# Patient Record
Sex: Male | Born: 1955 | Race: White | Hispanic: No | Marital: Married | Smoking: Never smoker
Health system: Southern US, Community
[De-identification: ages and names within clinical notes are randomized; demographics above are authoritative.]

## PROBLEM LIST (undated history)

## (undated) DIAGNOSIS — N400 Enlarged prostate without lower urinary tract symptoms: Secondary | ICD-10-CM

## (undated) DIAGNOSIS — M1990 Unspecified osteoarthritis, unspecified site: Secondary | ICD-10-CM

## (undated) DIAGNOSIS — Z9861 Coronary angioplasty status: Principal | ICD-10-CM

## (undated) DIAGNOSIS — K219 Gastro-esophageal reflux disease without esophagitis: Secondary | ICD-10-CM

## (undated) DIAGNOSIS — I10 Essential (primary) hypertension: Secondary | ICD-10-CM

## (undated) DIAGNOSIS — F32A Depression, unspecified: Secondary | ICD-10-CM

## (undated) DIAGNOSIS — I00 Rheumatic fever without heart involvement: Secondary | ICD-10-CM

## (undated) DIAGNOSIS — R42 Dizziness and giddiness: Secondary | ICD-10-CM

## (undated) DIAGNOSIS — I2111 ST elevation (STEMI) myocardial infarction involving right coronary artery: Secondary | ICD-10-CM

## (undated) DIAGNOSIS — E785 Hyperlipidemia, unspecified: Secondary | ICD-10-CM

## (undated) DIAGNOSIS — F319 Bipolar disorder, unspecified: Secondary | ICD-10-CM

## (undated) DIAGNOSIS — M069 Rheumatoid arthritis, unspecified: Secondary | ICD-10-CM

## (undated) DIAGNOSIS — F419 Anxiety disorder, unspecified: Secondary | ICD-10-CM

## (undated) DIAGNOSIS — I2510 Atherosclerotic heart disease of native coronary artery without angina pectoris: Secondary | ICD-10-CM

## (undated) DIAGNOSIS — F329 Major depressive disorder, single episode, unspecified: Secondary | ICD-10-CM

## (undated) HISTORY — PX: PERCUTANEOUS CORONARY STENT INTERVENTION (PCI-S): SHX6016

## (undated) HISTORY — DX: Coronary angioplasty status: Z98.61

## (undated) HISTORY — DX: ST elevation (STEMI) myocardial infarction involving right coronary artery: I21.11

## (undated) HISTORY — DX: Hyperlipidemia, unspecified: E78.5

## (undated) HISTORY — DX: Essential (primary) hypertension: I10

## (undated) HISTORY — DX: Atherosclerotic heart disease of native coronary artery without angina pectoris: I25.10

## (undated) HISTORY — DX: Dizziness and giddiness: R42

---
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
## 1998-09-05 ENCOUNTER — Ambulatory Visit (HOSPITAL_BASED_OUTPATIENT_CLINIC_OR_DEPARTMENT_OTHER): Admission: RE | Admit: 1998-09-05 | Discharge: 1998-09-05 | Payer: Self-pay | Admitting: Orthopedic Surgery

## 2002-04-02 HISTORY — PX: PLANTAR FASCIA SURGERY: SHX746

## 2008-07-09 ENCOUNTER — Encounter: Admission: RE | Admit: 2008-07-09 | Discharge: 2008-07-09 | Payer: Self-pay | Admitting: Sports Medicine

## 2009-01-27 ENCOUNTER — Encounter: Admission: RE | Admit: 2009-01-27 | Discharge: 2009-01-27 | Payer: Self-pay | Admitting: Internal Medicine

## 2009-03-31 ENCOUNTER — Encounter: Admission: RE | Admit: 2009-03-31 | Discharge: 2009-03-31 | Payer: Self-pay | Admitting: Neurology

## 2009-04-02 DIAGNOSIS — R42 Dizziness and giddiness: Secondary | ICD-10-CM

## 2009-04-02 HISTORY — DX: Dizziness and giddiness: R42

## 2009-04-07 ENCOUNTER — Ambulatory Visit: Payer: Self-pay

## 2009-04-07 ENCOUNTER — Encounter (INDEPENDENT_AMBULATORY_CARE_PROVIDER_SITE_OTHER): Payer: Self-pay | Admitting: Neurology

## 2009-04-07 ENCOUNTER — Ambulatory Visit (HOSPITAL_COMMUNITY): Admission: RE | Admit: 2009-04-07 | Discharge: 2009-04-07 | Payer: Self-pay | Admitting: Neurology

## 2009-04-07 ENCOUNTER — Ambulatory Visit: Payer: Self-pay | Admitting: Internal Medicine

## 2009-09-05 ENCOUNTER — Ambulatory Visit (HOSPITAL_BASED_OUTPATIENT_CLINIC_OR_DEPARTMENT_OTHER): Admission: RE | Admit: 2009-09-05 | Discharge: 2009-09-05 | Payer: Self-pay | Admitting: Orthopedic Surgery

## 2009-09-30 HISTORY — PX: SHOULDER ARTHROSCOPY W/ ROTATOR CUFF REPAIR: SHX2400

## 2010-06-19 LAB — POCT I-STAT, CHEM 8
BUN: 26 mg/dL — ABNORMAL HIGH (ref 6–23)
Glucose, Bld: 100 mg/dL — ABNORMAL HIGH (ref 70–99)
HCT: 45 % (ref 39.0–52.0)
Hemoglobin: 15.3 g/dL (ref 13.0–17.0)
Potassium: 3.7 mEq/L (ref 3.5–5.1)
Sodium: 141 mEq/L (ref 135–145)
TCO2: 28 mmol/L (ref 0–100)

## 2011-06-01 DIAGNOSIS — I2119 ST elevation (STEMI) myocardial infarction involving other coronary artery of inferior wall: Secondary | ICD-10-CM | POA: Insufficient documentation

## 2011-06-01 DIAGNOSIS — I2111 ST elevation (STEMI) myocardial infarction involving right coronary artery: Secondary | ICD-10-CM

## 2011-06-01 HISTORY — DX: ST elevation (STEMI) myocardial infarction involving right coronary artery: I21.11

## 2011-06-03 DIAGNOSIS — I25119 Atherosclerotic heart disease of native coronary artery with unspecified angina pectoris: Secondary | ICD-10-CM | POA: Insufficient documentation

## 2011-06-03 DIAGNOSIS — I25709 Atherosclerosis of coronary artery bypass graft(s), unspecified, with unspecified angina pectoris: Secondary | ICD-10-CM | POA: Insufficient documentation

## 2011-06-28 ENCOUNTER — Other Ambulatory Visit: Payer: Self-pay

## 2011-06-28 ENCOUNTER — Encounter (HOSPITAL_COMMUNITY)
Admission: EM | Disposition: A | Discharge status: Home / Self Care | Payer: Self-pay | Source: Ambulatory Visit | Attending: Cardiology

## 2011-06-28 ENCOUNTER — Encounter (HOSPITAL_COMMUNITY): Payer: Self-pay | Admitting: Cardiology

## 2011-06-28 ENCOUNTER — Inpatient Hospital Stay (HOSPITAL_COMMUNITY)
Admission: EM | Admit: 2011-06-28 | Discharge: 2011-06-30 | DRG: 853 | Disposition: A | Discharge status: Home / Self Care | Payer: Federal, State, Local not specified - PPO | Source: Ambulatory Visit | Attending: Cardiology | Admitting: Cardiology

## 2011-06-28 DIAGNOSIS — K219 Gastro-esophageal reflux disease without esophagitis: Secondary | ICD-10-CM | POA: Diagnosis present

## 2011-06-28 DIAGNOSIS — I213 ST elevation (STEMI) myocardial infarction of unspecified site: Secondary | ICD-10-CM

## 2011-06-28 DIAGNOSIS — F419 Anxiety disorder, unspecified: Secondary | ICD-10-CM | POA: Diagnosis present

## 2011-06-28 DIAGNOSIS — I2119 ST elevation (STEMI) myocardial infarction involving other coronary artery of inferior wall: Principal | ICD-10-CM | POA: Diagnosis present

## 2011-06-28 DIAGNOSIS — F411 Generalized anxiety disorder: Secondary | ICD-10-CM | POA: Diagnosis present

## 2011-06-28 DIAGNOSIS — I498 Other specified cardiac arrhythmias: Secondary | ICD-10-CM | POA: Diagnosis present

## 2011-06-28 DIAGNOSIS — I10 Essential (primary) hypertension: Secondary | ICD-10-CM | POA: Diagnosis present

## 2011-06-28 DIAGNOSIS — Z955 Presence of coronary angioplasty implant and graft: Secondary | ICD-10-CM

## 2011-06-28 DIAGNOSIS — N400 Enlarged prostate without lower urinary tract symptoms: Secondary | ICD-10-CM | POA: Diagnosis present

## 2011-06-28 HISTORY — PX: LEFT HEART CATHETERIZATION WITH CORONARY ANGIOGRAM: SHX5451

## 2011-06-28 HISTORY — DX: Benign prostatic hyperplasia without lower urinary tract symptoms: N40.0

## 2011-06-28 HISTORY — DX: Gastro-esophageal reflux disease without esophagitis: K21.9

## 2011-06-28 HISTORY — DX: Anxiety disorder, unspecified: F41.9

## 2011-06-28 LAB — COMPREHENSIVE METABOLIC PANEL
ALT: 37 U/L (ref 0–53)
Albumin: 3.9 g/dL (ref 3.5–5.2)
Alkaline Phosphatase: 49 U/L (ref 39–117)
Calcium: 9 mg/dL (ref 8.4–10.5)
Chloride: 99 mEq/L (ref 96–112)
GFR calc non Af Amer: >90 mL/min (ref >90)
Glucose, Bld: 131 mg/dL — ABNORMAL HIGH (ref 70–99)
Sodium: 137 mEq/L (ref 135–145)
Total Bilirubin: 0.5 mg/dL (ref 0.3–1.2)

## 2011-06-28 LAB — CARDIAC PANEL(CRET KIN+CKTOT+MB+TROPI)
CK, MB: 54.6 ng/mL (ref 0.3–4.0)
Relative Index: 9 — ABNORMAL HIGH (ref 0.0–2.5)
Total CK: 125 U/L (ref 7–232)
Total CK: 605 U/L — ABNORMAL HIGH (ref 7–232)
Troponin I: 21.21 ng/mL (ref <0.30)

## 2011-06-28 LAB — CBC
HCT: 37 % — ABNORMAL LOW (ref 39.0–52.0)
MCHC: 38 g/dL — ABNORMAL HIGH (ref 30.0–36.0)
RBC: 4.38 MIL/uL (ref 4.22–5.81)
WBC: 6.2 10*3/uL (ref 4.0–10.5)

## 2011-06-28 LAB — POCT I-STAT, CHEM 8
BUN: 20 mg/dL (ref 6–23)
Creatinine, Ser: 0.9 mg/dL (ref 0.50–1.35)
Glucose, Bld: 136 mg/dL — ABNORMAL HIGH (ref 70–99)
Hemoglobin: 13.6 g/dL (ref 13.0–17.0)
Potassium: 3.2 mEq/L — ABNORMAL LOW (ref 3.5–5.1)

## 2011-06-28 LAB — LIPID PANEL
HDL: 29 mg/dL — ABNORMAL LOW (ref >39)
Total CHOL/HDL Ratio: 6 RATIO
VLDL: 61 mg/dL — ABNORMAL HIGH (ref 0–40)

## 2011-06-28 LAB — PROTIME-INR: Prothrombin Time: >90 seconds — ABNORMAL HIGH (ref 11.6–15.2)

## 2011-06-28 SURGERY — LEFT HEART CATHETERIZATION WITH CORONARY ANGIOGRAM
Anesthesia: LOCAL

## 2011-06-28 MED ORDER — TRAVOPROST 0.004 % OP SOLN
1.0000 [drp] | Freq: Every day | OPHTHALMIC | Status: DC
  Administered 2011-06-28 – 2011-06-29 (×2): 1 [drp] via OPHTHALMIC
  Filled 2011-06-28 (×4): qty 0.1

## 2011-06-28 MED ORDER — ALPRAZOLAM 0.25 MG PO TABS: 0.2500 mg | ORAL_TABLET | Freq: Three times a day (TID) | ORAL | Status: DC | PRN

## 2011-06-28 MED ORDER — HEPARIN (PORCINE) IN NACL 2-0.9 UNIT/ML-% IJ SOLN
INTRAMUSCULAR | Status: AC
  Filled 2011-06-28: qty 2000

## 2011-06-28 MED ORDER — PRASUGREL HCL 10 MG PO TABS
10.0000 mg | ORAL_TABLET | Freq: Every day | ORAL | Status: DC
  Administered 2011-06-29 – 2011-06-30 (×2): 10 mg via ORAL
  Filled 2011-06-28 (×3): qty 1

## 2011-06-28 MED ORDER — THERA M PLUS PO TABS: 1.0000 | ORAL_TABLET | Freq: Every day | ORAL | Status: DC

## 2011-06-28 MED ORDER — RISEDRONATE SODIUM 5 MG PO TABS: 5.0000 mg | ORAL_TABLET | Freq: Every day | ORAL | Status: DC

## 2011-06-28 MED ORDER — SODIUM CHLORIDE 0.9 % IV SOLN: 1.0000 mL/kg/h | INTRAVENOUS | Status: AC

## 2011-06-28 MED ORDER — ASPIRIN 81 MG PO CHEW
81.0000 mg | CHEWABLE_TABLET | Freq: Every day | ORAL | Status: DC
  Administered 2011-06-29 – 2011-06-30 (×2): 81 mg via ORAL
  Filled 2011-06-28: qty 1

## 2011-06-28 MED ORDER — ATORVASTATIN CALCIUM 40 MG PO TABS
40.0000 mg | ORAL_TABLET | Freq: Every day | ORAL | Status: DC
  Administered 2011-06-28 – 2011-06-29 (×2): 40 mg via ORAL
  Filled 2011-06-28 (×3): qty 1

## 2011-06-28 MED ORDER — SODIUM CHLORIDE 0.9 % IV SOLN: 250.0000 mL | INTRAVENOUS | Status: DC

## 2011-06-28 MED ORDER — PANTOPRAZOLE SODIUM 40 MG PO TBEC
40.0000 mg | DELAYED_RELEASE_TABLET | Freq: Every day | ORAL | Status: DC
  Administered 2011-06-29 – 2011-06-30 (×2): 40 mg via ORAL
  Filled 2011-06-28 (×3): qty 1

## 2011-06-28 MED ORDER — FLUTICASONE PROPIONATE 50 MCG/ACT NA SUSP
1.0000 | Freq: Every day | NASAL | Status: DC | PRN
  Filled 2011-06-28: qty 16

## 2011-06-28 MED ORDER — LIDOCAINE HCL (PF) 1 % IJ SOLN
INTRAMUSCULAR | Status: AC
  Filled 2011-06-28: qty 30

## 2011-06-28 MED ORDER — BIVALIRUDIN 250 MG IV SOLR
INTRAVENOUS | Status: AC
  Filled 2011-06-28: qty 250

## 2011-06-28 MED ORDER — ONDANSETRON HCL 4 MG/2ML IJ SOLN: 4.0000 mg | Freq: Four times a day (QID) | INTRAMUSCULAR | Status: DC | PRN

## 2011-06-28 MED ORDER — ZOLPIDEM TARTRATE 5 MG PO TABS
10.0000 mg | ORAL_TABLET | Freq: Every evening | ORAL | Status: DC | PRN
  Administered 2011-06-28: 10 mg via ORAL
  Filled 2011-06-28: qty 2

## 2011-06-28 MED ORDER — METOPROLOL TARTRATE 12.5 MG HALF TABLET
12.5000 mg | ORAL_TABLET | Freq: Two times a day (BID) | ORAL | Status: DC
  Administered 2011-06-28 – 2011-06-30 (×4): 12.5 mg via ORAL
  Filled 2011-06-28 (×5): qty 1

## 2011-06-28 MED ORDER — ONDANSETRON HCL 4 MG/2ML IJ SOLN
INTRAMUSCULAR | Status: AC
  Filled 2011-06-28: qty 2

## 2011-06-28 MED ORDER — MIDAZOLAM HCL 2 MG/2ML IJ SOLN
INTRAMUSCULAR | Status: AC
  Filled 2011-06-28: qty 2

## 2011-06-28 MED ORDER — ADULT MULTIVITAMIN W/MINERALS CH
1.0000 | ORAL_TABLET | Freq: Every day | ORAL | Status: DC
  Administered 2011-06-29 – 2011-06-30 (×2): 1 via ORAL
  Filled 2011-06-28 (×2): qty 1

## 2011-06-28 MED ORDER — DUTASTERIDE 0.5 MG PO CAPS
0.5000 mg | ORAL_CAPSULE | Freq: Every day | ORAL | Status: DC
  Administered 2011-06-29 – 2011-06-30 (×2): 0.5 mg via ORAL
  Filled 2011-06-28 (×2): qty 1

## 2011-06-28 MED ORDER — IBUPROFEN 200 MG PO TABS
200.0000 mg | ORAL_TABLET | Freq: Four times a day (QID) | ORAL | Status: DC | PRN
  Filled 2011-06-28: qty 1

## 2011-06-28 MED ORDER — SODIUM CHLORIDE 0.9 % IJ SOLN: 3.0000 mL | INTRAMUSCULAR | Status: DC | PRN

## 2011-06-28 MED ORDER — POTASSIUM CHLORIDE CRYS ER 20 MEQ PO TBCR
40.0000 meq | EXTENDED_RELEASE_TABLET | Freq: Once | ORAL | Status: AC
  Administered 2011-06-28: 40 meq via ORAL
  Filled 2011-06-28: qty 2

## 2011-06-28 MED ORDER — ACETAMINOPHEN 325 MG PO TABS
650.0000 mg | ORAL_TABLET | ORAL | Status: DC | PRN
  Administered 2011-06-29 – 2011-06-30 (×2): 650 mg via ORAL
  Filled 2011-06-28 (×2): qty 2

## 2011-06-28 MED ORDER — SODIUM CHLORIDE 0.9 % IJ SOLN
3.0000 mL | Freq: Two times a day (BID) | INTRAMUSCULAR | Status: DC
  Administered 2011-06-29 (×2): 3 mL via INTRAVENOUS
  Administered 2011-06-29: 10:00:00 via INTRAVENOUS

## 2011-06-28 MED ORDER — PAROXETINE HCL 10 MG PO TABS
10.0000 mg | ORAL_TABLET | Freq: Every day | ORAL | Status: DC
  Administered 2011-06-29 – 2011-06-30 (×2): 10 mg via ORAL
  Filled 2011-06-28 (×3): qty 1

## 2011-06-28 MED ORDER — NITROGLYCERIN 0.2 MG/ML ON CALL CATH LAB
INTRAVENOUS | Status: AC
  Filled 2011-06-28: qty 1

## 2011-06-28 MED ORDER — PRASUGREL HCL 10 MG PO TABS
ORAL_TABLET | ORAL | Status: AC
  Filled 2011-06-28: qty 6

## 2011-06-28 MED ORDER — TRAVOPROST 0.004 % OP SOLN: 1.0000 [drp] | Freq: Every day | OPHTHALMIC | Status: DC

## 2011-06-28 NOTE — Progress Notes (Signed)
CRITICAL VALUE ALERT  Critical value received:  CKMB 54.6/ Troponin 21.21   Date of notification:  06-28-11  Time of notification:  2230  Critical value read back:yes  Nurse who received alert:  Hillery Aldo, RN  MD notified (1st page):  Hinda Glatter PA  Time of first page:  2236  MD notified (2nd page):  Time of second page:  Responding MD:  Hinda Glatter, PA  Time MD responded:  [phone]

## 2011-06-28 NOTE — Progress Notes (Signed)
PHARMACIST - PHYSICIAN COMMUNICATION  CONCERNING: P&T Medication Policy Regarding Oral Bisphosphonates  RECOMMENDATION: Your order for alendronate (Fosamax), ibandronate (Boniva), or risedronate (Actonel) has been discontinued at this time.  If the patient's post-hospital medical condition warrants safe use of this class of drugs, please resume the pre-hospital regimen upon discharge.  DESCRIPTION:  Alendronate (Fosamax), ibandronate (Boniva), and risedronate (Actonel) can cause severe esophageal erosions in patients who are unable to remain upright at least 30 minutes after taking this medication.   Since brief interruptions in therapy are thought to have minimal impact on bone mineral density, the Pharmacy & Therapeutics Committee has established that bisphosphonate orders should be routinely discontinued during hospitalization.   To override this safety policy and permit administration of Boniva, Fosamax, or Actonel in the hospital, prescribers must write "DO NOT HOLD" in the comments section when placing the order for this class of medications.  Thank you, Nadara Mustard, PharmD., MS Clinical Pharmacist Pager:  [phone]  06-28-2011 21:53PM

## 2011-06-28 NOTE — CV Procedure (Signed)
[facility]     CARDIAC CATHETERIZATION REPORT  NAME:  [name]     MRN: [id number] DATE OF BIRTH:  [birth year]   ADMIT DATE:  06/28/2011  Performing Cardiologist: Marykay Lex, M.D., M.S. Primary Physician: No primary provider on file. 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 The pt has no prior history of CAD or prior cardiac w/u. He did have an echo done Jan 2011 when he had a Neurologic w/u for dizziness. Echo showed inf HK otherwise normal LVF. This am he had some chest pain around 5am, not severe. He went on to work. Pain off and on all am then increasing symptoms with adiaphoresis. 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 EKG showed inf/septal ST elevation. He was taken urgently to cath lab.   Consent: The procedure with Risks/Benefits/Alternatives and Indications was reviewed with the patient.  All questions were answered.    Risks / Complications include, but not limited to: Death, MI, CVA/TIA, VF/VT (with defibrillation), Bradycardia (need for temporary pacer placement), contrast induced nephropathy, bleeding / bruising / hematoma / pseudoaneurysm, vascular or coronary injury (with possible emergent CT or Vascular Surgery), adverse medication reactions, infection.   Emergency consent obtained by verbal understanding.    Procedure: The patient was brought to the 2nd Floor [facility] in the fasting state and prepped and draped in the usual sterile  fashion for Right groin access. Sterile technique was used including antiseptics, cap, gloves, gown, hand hygiene, mask and sheet.  Skin prep: Chlorhexidine;  Time Out: Verified patient identification, verified procedure, site/side was marked, verified correct patient position, special equipment/implants available, medications/allergies/relevent history reviewed, required imaging and test results available.  Performed  The right femoral head was identified using tactile and fluoroscopic technique.  The right groin was anesthetized with 1% subcutaneous Lidocaine.  The right Common Femoral Artery was accessed using the Modified Seldinger Technique with placement of a antimicrobial bonded/coated single lumen (6 Fr) sheath.  The sheath was aspirated and flushed.    A 6 Fr JL4 followed by JR4 Catheter was advanced of over a Standard J wire into the ascending Aorta and used to engage the Left then Right Coronary Artery.  Multiple cineangiographic views of the Both Coronary Artery system(s) were performed.   After completion of the interventional procedure, the guide catheter was then exchanged over the standard J wire for an angled Pigtail catheter that was advanced across the Aortic Valve.  LV hemodynamics were measured and Left Ventriculography was performed.  LV hemodynamics were then re-sampled, and the catheter was pulled back across the Aortic Valve for measurement of "pull-back" gradient.  The catheter wire were removed completely out of the body.  Hemodynamics:  Central Aortic Pressure / Mean Aortic Pressure: 128/80 mmHg ; 101 mmHG  LV Pressure / LV End diastolic Pressure:  127/12  mmHg ; 17 mmHG  Left Ventriculography:  EF:  55-60%  Wall Motion: Mild mid inferior hypokinesis  Coronary Angiographic Data:  Left Main:  Large caliber vessel bifurcates into LAD and circumflex the is a short vessel that is angiographically normal.  Left Anterior Descending (LAD):  Large caliber vessel which done  around the apex. It gives rise to a major first diagonal branch that itself has several branches. Just after this diagonal branch there is a tubular 50% lesion in the LAD with a take off a large septal trunk. The vessel is then angiographically normal until just after giving rise to a second diagonal branch there is intramyocardial bridge is that short tubular 50% lesion. Otherwise the vessel is relatively normal.  1st diagonal (D1):  Moderate caliber vessel; ostial portion has a roughly 30% lesion. This gives rise to a proximal branch that bifurcates distally. Minimal luminal irregularities.  2nd diagonal (D2):  This is a smaller caliber vessel arising from the distal portion the mid LAD; angiographically normal  Circumflex (LCx):  Large caliber vessel gives rise to a very small first obtuse marginal and just before but V. the atrial ventricular groove vessel and a major OM there is a focal 20% lesion to dissected bifurcation in the atrial ventricular groove there is another napkin ring type 40% lesion right before it bifurcates in to get another OM branch and the posterior lateral branches.  1st obtuse marginal:  Small-caliber vessel that covers minimal area, but mild luminal irregularities  2nd obtuse marginal:  Major obtuse marginal branch somewhat tortuous with minimal luminal irregular reaches down toward the apex.  3rd obtuse marginal:  This is the second real obtuse marginal branch that has minimal luminal irregularities   posterior lateral branch:  The second branch of the AV groove circumflex that has minimal luminal irregularities  Right Coronary Artery: Large dominant vessel with a mid tubular 20% lesion followed by complete occlusion in the midportion is thrombotic appearance.  After reperfusion with the wire the vessel proved to be a large dominant vessel with extensive PL and PDA system. The following findings were post PCI:  posterior descending artery: This a large branching vessel  with minimal luminal irregularities.  posterior lateral branch:  This is a small posterior lateral system with several branches from the PDA covering the PL system.  There is also small marginal branch comes off just prior to the PDA in a sort of double barrel PDA system.  PERCUTANEOUS CORONARY INTERVENTION PROCEDURE  After reviewing the initial cineangiography images, the culprit lesion(s) was identified, and the decision was made to proceed with percutaneous coronary intervention.   A weight based bolus of IV Angiomax was administered and the drip was continued until completion of the procedure.   Oral Prasugrel 60 mg  was administered.  The Guide catheter(s) were advanced over a J-wire and used to engage the right Coronary Artery. An ACT of > 200 Sec was confirmed prior to advancing the Guidewire.  Lesion #1:  Mid RCA; thrombotic  Pre-PCI Stenosis: 100% % Post-PCI Stenosis: 0 %     TIMI 0 flow       TIMI 3 flow  Guide Catheter: JR 4  Guidewire: Prowater, exchanged for Cougar    Reperfusion was established with wire-passing  Expressway thrombectomy catheter: 1 pass; door to device time 22 minutes (1352 to 1414 hours)  Pre-Dilitation Balloon: Emerge Monorail 2.0 mm 12 mm   1st Inflation: 8 Atm for 31 Sec Scout angiography did not reveal evidence of dissection or perforation  Stent: Post Element DES 3.0 mm x 24 mm   Deployment: 12 Atm for 32 Sec  2nd Inflation: 17 Atm for 45 Sec; vessel diameter is 3.3  Scout angiography did not reveal evidence of dissection or perforation  Post-Dilitation Balloon:  Quantum Apex 3.5 mm x 20 mm   1st Inflation: 16 Atm for 46 Sec; distal, final diameter 3. 62   2nd Inflation: 14 Atm for 45 Sec; proximal, final diameter 3.58  Post-deployment cineangiography with and without guidewire in place was performed in multiple orthogonal views demonstrating excellent stent deployment and did not reveal evidence of dissection or perforation  A brief Right  Common Femoral Angiogram was performed to evaluate access site for pot possible Angio-Seal closure device. The leg was then reprepped and draped in sterile fashion, new sterile gloves donned. The right common femoral artery was then successfully closed with a 6 French Angio-Seal closure device.Hemostasis was insured.  The patient was transported to the PACU-CCU in a evidently stable, chest pain free condition.   The patient  was stable before, during and following the procedure.   Patient did tolerate procedure well. There were not complications. EBL: Less than 10 mL  Medications:  Sedation:  1 mg IV Versed, 25 IV mcg Fentanyl  Contrast:  190 Omnipaque  Anticoagulation: Angiomax bolus and drip during the procedure  Effient 60 mg by mouth  Intracoronary Nitroglycerin 200 mcg x1  Impression:  Successful aspiration thrombectomy with PTCA and stenting of the mid RCA 100% thrombotic occlusion with a placement of 3.0 mm x 24 mm Promus DES postdilated to 3.58 mm.  Moderate LAD lesions, to be evaluated by post discharge stress test.  Preserved Left Ventricular Ejection Fraction mildly elevated EDP after significant hydration.  Plan:  Admit to CCU for post STEMI care.  This FastTrack admission.  Dual antiplatelet therapy for at least a year.  Continue home medications and add statin.  The case and results was discussed with the patient (and family). The case and results was not discussed with the patient's PCP. The case and results was discussed with the patient's Cardiologist.  Time Spend Directly with Patient:  90 minutes  Tonie Elsey W, M.D., M.S. [facility] [address] [place], [place]  [address]  [phone]

## 2011-06-28 NOTE — Progress Notes (Signed)
Patient [name], 56 year old white male arrived at E.D. with chest pains, and was moved to the Cath Lab.  Chaplain received page of a "Code Stemi".  No family in Cath Lab at present.  Nurse promised to page Chaplain if any family arrives.  I will follow-up as needed.

## 2011-06-28 NOTE — H&P (Addendum)
Patient ID: BRAXSON HOLLINGSWORTH MRN: [id number], DOB/AGE: [birth year]   Admit date: 06/28/2011   Primary Physician: No primary provider on file. 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 The pt has no prior history of CAD or prior cardiac w/u. He did have an echo done Jan 2011 when he had a Neurologic w/u for dizziness. Echo showed inf HK otherwise normal LVF. This am he had some chest pain around 5am, not severe. He went on to work. Pain off and on all am then increasing symptoms with adiaphoresis. 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 EKG showed inf/septal ST elevation. He was taken urgently to cath lab by Dr Herbie Baltimore.   Problem List: No past medical history on file.  No past surgical history on file.   Allergies: Allergies no known allergies   Home Medications No prescriptions prior to admission  Atenolol HCTZ Clonopin Paxil Avodart (?)    No family history on file. Father had CABG 26s, 3 sisters without CAD   History   Social History  . Marital Status: Married    Spouse Name: N/A    Number of Children: N/A  . Years of Education: N/A   Occupational History  . Not on file.   Social History Main Topics  . Smoking status: Not on file  . Smokeless tobacco: Not on file  . Alcohol Use: Not on file  . Drug Use: Not on file  . Sexually Active: Not on file   Other Topics Concern  . Not on file   Social History Narrative  . No narrative on file   Married, never smoked, 2 children  Review of Systems: Unobtainable as pt is an urgent cath Physical Exam: Weight 92.987 kg (205 lb).  per Dr Herbie Baltimore, taken urgently to cath lab    Labs:   Results for orders placed during the hospital encounter of 06/28/11 (from the past 24 hour(s))  CBC     Status: Abnormal (Preliminary result)   Collection Time   06/28/11  2:10 PM      Component Value Range   WBC 6.2  4.0 - 10.5 (K/uL)   RBC 4.38  4.22 -  5.81 (MIL/uL)   Hemoglobin PENDING  13.0 - 17.0 (g/dL)   HCT 40.9 (*) 81.1 - 52.0 (%)   MCV 84.5  78.0 - 100.0 (fL)   MCH PENDING  26.0 - 34.0 (pg)   MCHC PENDING  30.0 - 36.0 (g/dL)   RDW 91.4  78.2 - 95.6 (%)   Platelets 161  150 - 400 (K/uL)  COMPREHENSIVE METABOLIC PANEL     Status: Abnormal   Collection Time   06/28/11  2:10 PM      Component Value Range   Sodium 137  135 - 145 (mEq/L)   Potassium 3.1 (*) 3.5 - 5.1 (mEq/L)   Chloride 99  96 - 112 (mEq/L)   CO2 24  19 - 32 (mEq/L)   Glucose, Bld 131 (*) 70 - 99 (mg/dL)   BUN 22  6 - 23 (mg/dL)   Creatinine, Ser 2.13  0.50 - 1.35 (mg/dL)   Calcium 9.0  8.4 - 08.6 (mg/dL)   Total Protein 6.1  6.0 - 8.3 (g/dL)   Albumin 3.9  3.5 - 5.2 (g/dL)   AST 29  0 - 37 (U/L)   ALT 37  0 - 53 (U/L)   Alkaline Phosphatase 49  39 - 117 (  U/L)   Total Bilirubin 0.5  0.3 - 1.2 (mg/dL)   GFR calc non Af Amer >90  >90 (mL/min)   GFR calc Af Amer >90  >90 (mL/min)  LIPID PANEL     Status: Abnormal   Collection Time   06/28/11  2:10 PM      Component Value Range   Cholesterol 175  0 - 200 (mg/dL)   Triglycerides 811 (*) <150 (mg/dL)   HDL 29 (*) >91 (mg/dL)   Total CHOL/HDL Ratio 6.0     VLDL 61 (*) 0 - 40 (mg/dL)   LDL Cholesterol 85  0 - 99 (mg/dL)  APTT     Status: Abnormal   Collection Time   06/28/11  2:10 PM      Component Value Range   aPTT 172 (*) 24 - 37 (seconds)  CARDIAC PANEL(CRET KIN+CKTOT+MB+TROPI)     Status: Normal (Preliminary result)   Collection Time   06/28/11  2:10 PM      Component Value Range   Total CK PENDING  7 - 232 (U/L)   CK, MB 2.4  0.3 - 4.0 (ng/mL)   Troponin I <0.30  <0.30 (ng/mL)   Relative Index PENDING  0.0 - 2.5      Radiology/Studies: No results found.  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  Went to work, but pain recurred & did not get any better -- pulled over & called EMS @ ~1300.  ECG 1343 demonstrated Inferior STEMI.  Pt was diaphoretic & nauseated - hypotensive after NTG.  Taken acutely to Cath lab for Emergent Cath / PCI of RCA.  Marykay Lex, M.D., M.S. [facility] [address] [place], [place]  [address]  [phone] Pager # [phone]  06/28/2011 3:12 PM  PE: WD, WN male, diaphortic, c/o chest pain         HEENT: normocephalic, glasses         Neck: Without JVD or bruit         Chest: clear         C/V: RRR with murmur          Abd: Non tender, not distended          Extrem; without edema          Neuro: grossly intact          Skin: diaphoretic  LUKE KILROY PA-C 06/28/2011 4:04 PM  Agree with exam addendum. Marykay Lex, M.D., M.S. [facility] [address] [place], [place]  [address]  [phone] Pager # [phone]  06/28/2011 6:17 PM

## 2011-06-29 ENCOUNTER — Other Ambulatory Visit: Payer: Self-pay

## 2011-06-29 ENCOUNTER — Encounter (HOSPITAL_COMMUNITY): Payer: Self-pay

## 2011-06-29 DIAGNOSIS — Z955 Presence of coronary angioplasty implant and graft: Secondary | ICD-10-CM

## 2011-06-29 LAB — BASIC METABOLIC PANEL
Chloride: 104 mEq/L (ref 96–112)
Creatinine, Ser: 0.79 mg/dL (ref 0.50–1.35)
GFR calc Af Amer: >90 mL/min (ref >90)
GFR calc non Af Amer: >90 mL/min (ref >90)

## 2011-06-29 LAB — CARDIAC PANEL(CRET KIN+CKTOT+MB+TROPI)
CK, MB: 43.7 ng/mL (ref 0.3–4.0)
Relative Index: 8.3 — ABNORMAL HIGH (ref 0.0–2.5)
Total CK: 527 U/L — ABNORMAL HIGH (ref 7–232)
Troponin I: 16.53 ng/mL (ref <0.30)

## 2011-06-29 LAB — LIPID PANEL
Cholesterol: 181 mg/dL (ref 0–200)
LDL Cholesterol: 98 mg/dL (ref 0–99)
Total CHOL/HDL Ratio: 5.8 RATIO
VLDL: 52 mg/dL — ABNORMAL HIGH (ref 0–40)

## 2011-06-29 LAB — CBC
MCV: 87.1 fL (ref 78.0–100.0)
Platelets: 154 10*3/uL (ref 150–400)
RDW: 13.3 % (ref 11.5–15.5)
WBC: 7.1 10*3/uL (ref 4.0–10.5)

## 2011-06-29 MED ORDER — ACTIVE PARTNERSHIP FOR HEALTH OF YOUR HEART BOOK
Freq: Once | Status: AC
  Administered 2011-06-29: 02:00:00
  Filled 2011-06-29: qty 1

## 2011-06-29 MED FILL — Dextrose Inj 5%: INTRAVENOUS | Qty: 50 | Status: AC

## 2011-06-29 NOTE — Progress Notes (Signed)
CARDIAC REHAB PHASE I   PRE:  Rate/Rhythm: 66 SR    BP: sitting 135/75    SaO2: 99 RA  MODE:  Ambulation: 660 ft   POST:  Rate/Rhythm: 75    BP: sitting 128/73     SaO2:   Tolerated well. Ed completed.Requests his name be sent to [facility]. [id number] Harriet Masson CES, ACSM    66 SR

## 2011-06-29 NOTE — Progress Notes (Signed)
UR Completed. Simmons, Jaimin Krupka F [phone]  

## 2011-06-29 NOTE — Progress Notes (Signed)
   CARE MANAGEMENT NOTE 06/29/2011  Patient:  [name],[name]   Account Number:  [id number]  Date Initiated:  06/29/2011  Documentation initiated by:  GRAVES-BIGELOW,Shantil Vallejo  Subjective/Objective Assessment:   Pt admitted with stemi. Pt is from home with wife and daughter. Pt plan for d/c on effient. Pt uses [facility] for medication. CM did call to make sure medicaiton is available and it is in stock.     Action/Plan:   CM was unable to get a co pay cost on medicaiton. Health visitor closed.  CM will provide pt with 30 day free/ copay card. MD please write Rx for 30 day free no refills along with origininal Rx with refills.   Anticipated DC Date:  06/30/2011   Anticipated DC Plan:  HOME/SELF CARE      DC Planning Services  CM consult      Choice offered to / List presented to:             Status of service:  Completed, signed off Medicare Important Message given?   (If response is "NO", the following Medicare IM given date fields will be blank) Date Medicare IM given:   Date Additional Medicare IM given:    Discharge Disposition:  HOME/SELF CARE  Per UR Regulation:    If discussed at Long Length of Stay Meetings, dates discussed:    Comments:

## 2011-06-29 NOTE — Progress Notes (Signed)
[facility]  Subjective: No complaints.  No CP/SOB.  Objective: Vital signs in last 24 hours: Temp:  [97.8 F (36.6 C)-98.3 F (36.8 C)] 98.2 F (36.8 C) (03/29 0807) Pulse Rate:  [47-69] 58  (03/29 0807) Resp:  [10-16] 12  (03/29 0807) BP: (119-143)/(73-90) 125/78 mmHg (03/29 0807) SpO2:  [95 %-100 %] 95 % (03/29 0807) FiO2 (%):  [2 %] 2 % (03/28 1800) Weight:  [91.4 kg (201 lb 8 oz)-92.987 kg (205 lb)] 91.4 kg (201 lb 8 oz) (03/28 1705) Last BM Date: 06/28/11  Intake/Output from previous day: 03/28 0701 - 03/29 0700 In: 674.3 [I.V.:674.3] Out: 1275 [Urine:1275] Intake/Output this shift: Total I/O In: -  Out: 300 [Urine:300]  Medications Current Facility-Administered Medications  Medication Dose Route Frequency Provider Last Rate Last Dose  . 0.9 %  sodium chloride infusion  1 mL/kg/hr Intravenous Continuous Marykay Lex, MD 93 mL/hr at 06/28/11 1745 1 mL/kg/hr at 06/28/11 1745  . acetaminophen (TYLENOL) tablet 650 mg  650 mg Oral Q4H PRN Marykay Lex, MD      . active partnership for health of your heart book   Does not apply Once Marykay Lex, MD      . ALPRAZolam Prudy Feeler) tablet 0.25 mg  0.25 mg Oral TID PRN Abelino Derrick, PA      . aspirin chewable tablet 81 mg  81 mg Oral Daily Marykay Lex, MD      . atorvastatin (LIPITOR) tablet 40 mg  40 mg Oral q1800 Marykay Lex, MD   40 mg at 06/28/11 1834  . bivalirudin (ANGIOMAX) 250 MG injection           . dutasteride (AVODART) capsule 0.5 mg  0.5 mg Oral Daily Luke K Kilroy, [place]      . fluticasone (FLONASE) 50 MCG/ACT nasal spray 1 spray  1 spray Each Nare Daily PRN Abelino Derrick, PA      . heparin 2-0.9 UNIT/ML-% infusion           . ibuprofen (ADVIL,MOTRIN) tablet 200 mg  200 mg Oral Q6H PRN Abelino Derrick, PA      . lidocaine (XYLOCAINE) 1 % injection           . metoprolol tartrate (LOPRESSOR) tablet 12.5 mg  12.5 mg Oral BID Eda Paschal [place], PA   12.5 mg at 06/28/11 2239  .  midazolam (VERSED) 2 MG/2ML injection           . mulitivitamin with minerals tablet 1 tablet  1 tablet Oral Daily Marykay Lex, MD      . nitroGLYCERIN (NTG ON-CALL) 0.2 mg/mL injection           . ondansetron (ZOFRAN) 4 MG/2ML injection           . ondansetron (ZOFRAN) injection 4 mg  4 mg Intravenous Q6H PRN Marykay Lex, MD      . pantoprazole (PROTONIX) EC tablet 40 mg  40 mg Oral [address] [place], [place]   40 mg at 06/29/11 0536  . PARoxetine (PAXIL) tablet 10 mg  10 mg Oral Q breakfast Eda Paschal [place], [place]      . potassium chloride SA (K-DUR,KLOR-CON) CR tablet 40 mEq  40 mEq Oral Once Abelino Derrick, [place]   40 mEq at 06/28/11 1834  . prasugrel (EFFIENT) 10 MG tablet           . prasugrel (EFFIENT) tablet 10 mg  10 mg Oral Daily Marykay Lex, MD      . sodium chloride 0.9 % injection 3 mL  3 mL Intravenous Q12H Marykay Lex, MD   3 mL at 06/29/11 0000  . sodium chloride 0.9 % injection 3 mL  3 mL Intravenous PRN Marykay Lex, MD      . travoprost (benzalkonium) (TRAVATAN) 0.004 % ophthalmic solution 1 drop  1 drop Both Eyes QHS Marykay Lex, MD   1 drop at 06/28/11 2240  . zolpidem (AMBIEN) tablet 10 mg  10 mg Oral QHS PRN Abelino Derrick, PA   10 mg at 06/28/11 2241  . DISCONTD: 0.9 %  sodium chloride infusion  250 mL Intravenous Continuous Marykay Lex, MD      . DISCONTD: multivitamins ther. w/minerals tablet 1 tablet  1 tablet Oral Daily Eda Paschal [place], [place]      . DISCONTD: risedronate (ACTONEL) tablet 5 mg  5 mg Oral QAC breakfast Abelino Derrick, [place]      . DISCONTD: travoprost (benzalkonium) (TRAVATAN) 0.004 % ophthalmic solution 1 drop  1 drop Both Eyes QHS Abelino Derrick, [place]        PE: General appearance: alert, cooperative and no distress Lungs: clear to auscultation bilaterally Heart: regular rate and rhythm, S1, S2 normal, no murmur, click, rub or gallop Extremities: No LEE Pulses: 2+ and symmetric Right Groin:  Small hematoma. Nontender, No ecchymosis or  bruit.  Lab Results:   Basename 06/29/11 0605 06/28/11 1411 06/28/11 1410  WBC 7.1 -- 6.2  HGB 14.3 13.6 14.1  HCT 39.8 40.0 37.0*  PLT 154 -- 161   BMET  Basename 06/29/11 0605 06/28/11 1411 06/28/11 1410  NA 140 141 137  K 4.1 3.2* 3.1*  CL 104 104 99  CO2 29 -- 24  GLUCOSE 106* 136* 131*  BUN 18 20 22   CREATININE 0.79 0.90 0.80  CALCIUM 9.2 -- 9.0   PT/INR  Basename 06/28/11 1410  LABPROT >90.0*  INR >10.00*   Cholesterol  Basename 06/29/11 0605  CHOL 181   Cardiac Enzymes No components found with this basename: TROPONIN:3, CKMB:3  Studies/Results: Hemodynamics:  Central Aortic Pressure / Mean Aortic Pressure: 128/80 mmHg ; 101 mmHG  LV Pressure / LV End diastolic Pressure: 127/12 mmHg ; 17 mmHG  Left Ventriculography:  EF: 55-60%  Wall Motion: Mild mid inferior hypokinesis  Coronary Angiographic Data:  Left Main: Large caliber vessel bifurcates into LAD and circumflex the is a short vessel that is angiographically normal.  Left Anterior Descending (LAD): Large caliber vessel which done around the apex. It gives rise to a major first diagonal branch that itself has several branches. Just after this diagonal branch there is a tubular 50% lesion in the LAD with a take off a large septal trunk. The vessel is then angiographically normal until just after giving rise to a second diagonal branch there is intramyocardial bridge is that short tubular 50% lesion. Otherwise the vessel is relatively normal.  1st diagonal (D1): Moderate caliber vessel; ostial portion has a roughly 30% lesion. This gives rise to a proximal branch that bifurcates distally. Minimal luminal irregularities.  2nd diagonal (D2): This is a smaller caliber vessel arising from the distal portion the mid LAD; angiographically normal  Circumflex (LCx): Large caliber vessel gives rise to a very small first obtuse marginal and just before but V. the atrial ventricular groove vessel and a major OM there is a  focal 20% lesion to  dissected bifurcation in the atrial ventricular groove there is another napkin ring type 40% lesion right before it bifurcates in to get another OM branch and the posterior lateral branches.  1st obtuse marginal: Small-caliber vessel that covers minimal area, but mild luminal irregularities  2nd obtuse marginal: Major obtuse marginal branch somewhat tortuous with minimal luminal irregular reaches down toward the apex.  3rd obtuse marginal: This is the second real obtuse marginal branch that has minimal luminal irregularities  posterior lateral branch: The second branch of the AV groove circumflex that has minimal luminal irregularities  Right Coronary Artery: Large dominant vessel with a mid tubular 20% lesion followed by complete occlusion in the midportion is thrombotic appearance. After reperfusion with the wire the vessel proved to be a large dominant vessel with extensive PL and PDA system. The following findings were post PCI:  posterior descending artery: This a large branching vessel with minimal luminal irregularities.  posterior lateral branch: This is a small posterior lateral system with several branches from the PDA covering the PL system.  There is also small marginal branch comes off just prior to the PDA in a sort of double barrel PDA system. PERCUTANEOUS CORONARY INTERVENTION PROCEDURE  After reviewing the initial cineangiography images, the culprit lesion(s) was identified, and the decision was made to proceed with percutaneous coronary intervention.  A weight based bolus of IV Angiomax was administered and the drip was continued until completion of the procedure.  Oral Prasugrel 60 mg was administered.  The Guide catheter(s) were advanced over a J-wire and used to engage the right Coronary Artery.  An ACT of > 200 Sec was confirmed prior to advancing the Guidewire.  Lesion #1: Mid RCA; thrombotic  Pre-PCI Stenosis: 100% % Post-PCI Stenosis: 0 %  TIMI 0 flow  TIMI 3 flow  Guide Catheter: JR 4 Guidewire: Prowater, exchanged for Cougar  Reperfusion was established with wire-passing  Expressway thrombectomy catheter: 1 pass; door to device time 22 minutes (1352 to 1414 hours)  Pre-Dilitation Balloon: Emerge Monorail 2.0 mm 12 mm  1st Inflation: 8 Atm for 31 Sec  Scout angiography did not reveal evidence of dissection or perforation  Stent: Post Element DES 3.0 mm x 24 mm  Deployment: 12 Atm for 32 Sec  2nd Inflation: 17 Atm for 45 Sec; vessel diameter is 3.3  Scout angiography did not reveal evidence of dissection or perforation  Post-Dilitation Balloon: [place] Quantum Apex 3.5 mm x 20 mm  1st Inflation: 16 Atm for 46 Sec; distal, final diameter 3. 62  2nd Inflation: 14 Atm for 45 Sec; proximal, final diameter 3.58  Post-deployment cineangiography with and without guidewire in place was performed in multiple orthogonal views demonstrating excellent stent deployment and did not reveal evidence of dissection or perforation  A brief Right Common Femoral Angiogram was performed to evaluate access site for pot possible Angio-Seal closure device.  The leg was then reprepped and draped in sterile fashion, new sterile gloves donned. The right common femoral artery was then successfully closed with a 6 French Angio-Seal closure device.Hemostasis was insured.  The patient was transported to the PACU-CCU in a evidently stable, chest pain free condition.  The patient was stable before, during and following the procedure.  Patient did tolerate procedure well.  There were not complications.  EBL: Less than 10 mL  Medications:  Sedation: 1 mg IV Versed, 25 IV mcg Fentanyl  Contrast: 190 Omnipaque  Anticoagulation: Angiomax bolus and drip during the procedure  Effient 60 mg by  mouth  Intracoronary Nitroglycerin 200 mcg x1  Impression:  Successful aspiration thrombectomy with PTCA and stenting of the mid RCA 100% thrombotic occlusion with a placement of 3.0 mm x 24  mm Promus DES postdilated to 3.58 mm.  Moderate LAD lesions, to be evaluated by post discharge stress test.  Preserved Left Ventricular Ejection Fraction mildly elevated EDP after significant hydration. Plan:  Admit to CCU for post STEMI care. This FastTrack admission.  Dual antiplatelet therapy for at least a year.  Continue home medications and add statin. The case and results was discussed with the patient (and family).  The case and results was not discussed with the patient's PCP.  The case and results was discussed with the patient's Cardiologist.  Assessment/Plan  Principal Problem:  *STEMI (ST elevation myocardial infarction) Active Problems:  HTN (hypertension)  Anxiety  BPH (benign prostatic hyperplasia)  GERD (gastroesophageal reflux disease)  Plan:  S/P emergent left heart cath for STEMI. Total RCA.  Promus DES.  ASA/Effient/statin.  Trop trending down.  EF 55-60% Potassium improved.  Cardiac rehab.  Discussed diet and exercise.  Likely home tomorrow.  Echo pending.   LOS: 1 day    HAGER,BRYAN W 06/29/2011 8:13 AM  ATTENDING ATTESTATION:  I have seen and examined the patient along with Wilburt Finlay (PA, NP).  I have reviewed the chart, notes and new data.  I agree with Bryan's note.  Brief Description: 56 y/o with no prior h/o CAD --> Inf STEMI on 3/28 AM --> PCI of RCA. Doing well.  No arrythmia & appropriate "washout"of cardiac biomarkers. Groin site looks fine. K back up.  PLAN:  Ambulate today - transfer to telemetry  Statin, ASA & Effient (CM consult for obtaining Effient on discharge -- may switch to Brilinta if insurance requires)  Tolerating low dose BB - consider adding ACEI/ARB as OP.  PPI for GI prophylaxis on DAP  Shirelle Tootle W, M.D., M.S. [facility] [address] [place], [place]  [address]  [phone]  06/29/2011 12:22 PM

## 2011-06-29 NOTE — Progress Notes (Signed)
*  PRELIMINARY RESULTS* Echocardiogram 2D Echocardiogram has been performed.  [name] [facility] 06/29/2011, 10:38 AM

## 2011-06-30 MED ORDER — PRASUGREL HCL 10 MG PO TABS: 10.0000 mg | ORAL_TABLET | Freq: Every day | ORAL | Status: DC

## 2011-06-30 MED ORDER — METOPROLOL SUCCINATE 12.5 MG HALF TABLET: 12.5000 mg | ORAL_TABLET | Freq: Every day | ORAL | Status: DC

## 2011-06-30 MED ORDER — LISINOPRIL 5 MG PO TABS: 5.0000 mg | ORAL_TABLET | Freq: Every day | ORAL | Status: DC

## 2011-06-30 MED ORDER — ATORVASTATIN CALCIUM 40 MG PO TABS: 40.0000 mg | ORAL_TABLET | Freq: Every day | ORAL | Status: DC

## 2011-06-30 MED ORDER — ASPIRIN 81 MG PO CHEW: 81.0000 mg | CHEWABLE_TABLET | Freq: Every day | ORAL | Status: AC

## 2011-06-30 NOTE — Discharge Summary (Signed)
Physician Discharge Summary  Patient ID: [name] MRN: [id number] DOB/AGE: January 12, 1956 56 y.o.  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 The pt has no prior history of CAD or prior cardiac w/u. He did have an echo done Jan 2011 when he had a Neurologic w/u for dizziness. Echo showed inf HK otherwise normal LVF. The am of admission he had some chest pain around 5am, not severe. He went on to work. Pain off and on all am then increasing symptoms with diaphoresis. 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 EKG showed inf/septal ST elevation. He was taken urgently to cath lab by Dr Herbie Baltimore.  LHC revealed 100% occluded RCA with  Thrombus.  This was successfully aspirated and a 3.0x 24mm Promus DES was inserted(See full cath report below for details).  Follow up 2D echo revealed preserved EF of 55-60%, grade one diastolic dysfunction, Mild hypokinesis of the basal-mid inferoseptal myocardium.  Cardiac rehab was initiated.  The patient has done quite well.  He will be discharged on ASA/ Effient/lisinopril/ Toprol XL/Statin.  Follow-up in 1-2 weeks and will refrain for working until instruction by Dr Herbie Baltimore.  Consults: None  Significant Diagnostic Studies:  Hemodynamics:  Central Aortic Pressure / Mean Aortic Pressure: 128/80 mmHg ; 101 mmHG  LV Pressure / LV End diastolic Pressure: 127/12 mmHg ; 17 mmHG  Left Ventriculography:  EF: 55-60%  Wall Motion: Mild mid inferior hypokinesis  Coronary Angiographic  Data:  Left Main: Large caliber vessel bifurcates into LAD and circumflex the is a short vessel that is angiographically normal.  Left Anterior Descending (LAD): Large caliber vessel which done around the apex. It gives rise to a major first diagonal branch that itself has several branches. Just after this diagonal branch there is a tubular 50% lesion in the LAD with a take off a large septal trunk. The vessel is then angiographically normal until just after giving rise to a second diagonal branch there is intramyocardial bridge is that short tubular 50% lesion. Otherwise the vessel is relatively normal.  1st diagonal (D1): Moderate caliber vessel; ostial portion has a roughly 30% lesion. This gives rise to a proximal branch that bifurcates distally. Minimal luminal irregularities.  2nd diagonal (D2): This is a smaller caliber vessel arising from the distal portion the mid LAD; angiographically normal  Circumflex (LCx): Large caliber vessel gives rise to a very small first obtuse marginal and just before but V. the atrial ventricular groove vessel and a major OM there is a focal 20% lesion to dissected bifurcation in the atrial ventricular groove there is another napkin ring type 40% lesion right before it bifurcates in to get another OM branch and the posterior lateral branches.  1st obtuse marginal: Small-caliber vessel that covers minimal area, but mild luminal irregularities  2nd obtuse marginal: Major obtuse marginal branch somewhat tortuous with minimal luminal irregular reaches down toward the apex.  3rd obtuse marginal: This is the second real obtuse marginal branch that has minimal luminal irregularities  posterior lateral branch: The second branch of the  AV groove circumflex that has minimal luminal irregularities  Right Coronary Artery: Large dominant vessel with a mid tubular 20% lesion followed by complete occlusion in the midportion is thrombotic appearance. After reperfusion with the wire  the vessel proved to be a large dominant vessel with extensive PL and PDA system. The following findings were post PCI:  posterior descending artery: This a large branching vessel with minimal luminal irregularities.  posterior lateral branch: This is a small posterior lateral system with several branches from the PDA covering the PL system.  There is also small marginal branch comes off just prior to the PDA in a sort of double barrel PDA system. PERCUTANEOUS CORONARY INTERVENTION PROCEDURE  After reviewing the initial cineangiography images, the culprit lesion(s) was identified, and the decision was made to proceed with percutaneous coronary intervention.  A weight based bolus of IV Angiomax was administered and the drip was continued until completion of the procedure.  Oral Prasugrel 60 mg was administered.  The Guide catheter(s) were advanced over a J-wire and used to engage the right Coronary Artery.  An ACT of > 200 Sec was confirmed prior to advancing the Guidewire.  Lesion #1: Mid RCA; thrombotic  Pre-PCI Stenosis: 100% % Post-PCI Stenosis: 0 %  TIMI 0 flow TIMI 3 flow  Guide Catheter: JR 4 Guidewire: Prowater, exchanged for Cougar  Reperfusion was established with wire-passing  Expressway thrombectomy catheter: 1 pass; door to device time 22 minutes (1352 to 1414 hours)  Pre-Dilitation Balloon: Emerge Monorail 2.0 mm 12 mm  1st Inflation: 8 Atm for 31 Sec  Scout angiography did not reveal evidence of dissection or perforation  Stent: Post Element DES 3.0 mm x 24 mm  Deployment: 12 Atm for 32 Sec  2nd Inflation: 17 Atm for 45 Sec; vessel diameter is 3.3  Scout angiography did not reveal evidence of dissection or perforation  Post-Dilitation Balloon: [place] Quantum Apex 3.5 mm x 20 mm  1st Inflation: 16 Atm for 46 Sec; distal, final diameter 3. 62  2nd Inflation: 14 Atm for 45 Sec; proximal, final diameter 3.58  Post-deployment cineangiography with and without guidewire in place was  performed in multiple orthogonal views demonstrating excellent stent deployment and did not reveal evidence of dissection or perforation  A brief Right Common Femoral Angiogram was performed to evaluate access site for pot possible Angio-Seal closure device.  The leg was then reprepped and draped in sterile fashion, new sterile gloves donned. The right common femoral artery was then successfully closed with a 6 French Angio-Seal closure device.Hemostasis was insured.  The patient was transported to the PACU-CCU in a evidently stable, chest pain free condition.  The patient was stable before, during and following the procedure.  Patient did tolerate procedure well.  There were not complications.  EBL: Less than 10 mL  Medications:  Sedation: 1 mg IV Versed, 25 IV mcg Fentanyl  Contrast: 190 Omnipaque  Anticoagulation: Angiomax bolus and drip during the procedure  Effient 60 mg by mouth  Intracoronary Nitroglycerin 200 mcg x1  Impression:  Successful aspiration thrombectomy with PTCA and stenting of the mid RCA 100% thrombotic occlusion with a placement of 3.0 mm x 24 mm Promus DES postdilated to 3.58 mm.  Moderate LAD lesions, to be evaluated by post discharge stress test.  Preserved Left Ventricular Ejection Fraction mildly elevated EDP after significant hydration. Plan:  Admit to CCU for post STEMI care. This FastTrack admission.  Dual antiplatelet therapy for at least a year.  Continue home medications  and add statin.  __________________________________________________________________________________________________ 2D Echo- Study Conclusions  - Left ventricle: The cavity size was normal. Systolic function was normal. The estimated ejection fraction was in the range of 55% to 60%. Doppler parameters are consistent with abnormal left ventricular relaxation (grade 1 diastolic dysfunction). - Regional wall motion abnormality: Mild hypokinesis of the basal-mid inferoseptal  myocardium. Impressions:  - No evidence of significant valve disease.  Treatments: Promus DES to RCA, ASA/ Effient/lisinopril/ Toprol XL/Statin  Discharge Exam: Blood pressure 133/87, pulse 56, temperature 98.5 F (36.9 C), temperature source Oral, resp. rate 18, height 5\' 10"  (1.778 m), weight 91.4 kg (201 lb 8 oz), SpO2 96.00%.   Disposition: Home in stable condition  Discharge Orders    Future Orders Please Complete By Expires   Diet - low sodium heart healthy      Increase activity slowly      Discharge instructions      Comments:   No lifting or driving for three days.  May return to work after instructed by Dr. Herbie Baltimore. Follow-up appointment in about 1-2 weeks.     Medication List  As of 06/30/2011  1:06 PM   STOP taking these medications         hydrochlorothiazide 25 MG tablet         TAKE these medications         aspirin 81 MG chewable tablet   Chew 1 tablet (81 mg total) by mouth daily.      atorvastatin 40 MG tablet   Commonly known as: LIPITOR   Take 1 tablet (40 mg total) by mouth daily at 6 PM.      clonazePAM 1 MG tablet   Commonly known as: KLONOPIN   Take 1 mg by mouth 2 (two) times daily as needed. To help sleep per patient      dutasteride 0.5 MG capsule   Commonly known as: AVODART   Take 0.5 mg by mouth daily.      fluticasone 50 MCG/ACT nasal spray   Commonly known as: FLONASE   Place 1 spray into the nose daily as needed. For allergies      ibuprofen 200 MG tablet   Commonly known as: ADVIL,MOTRIN   Take 200 mg by mouth every 6 (six) hours as needed. For pain      lisinopril 5 MG tablet   Commonly known as: PRINIVIL,ZESTRIL   Take 1 tablet (5 mg total) by mouth daily.      metoprolol succinate 12.5 mg Tb24   Commonly known as: TOPROL-XL   Take 0.5 tablets (12.5 mg total) by mouth daily.      multivitamins ther. w/minerals Tabs   Take 1 tablet by mouth daily.      PARoxetine 10 MG tablet   Commonly known as: PAXIL   Take 10 mg  by mouth every morning.      prasugrel 10 MG Tabs   Commonly known as: EFFIENT   Take 1 tablet (10 mg total) by mouth daily.      risedronate 5 MG tablet   Commonly known as: ACTONEL   Take 5 mg by mouth daily before breakfast. with water on empty stomach, nothing by mouth or lie down for next 30 minutes.      travoprost (benzalkonium) 0.004 % ophthalmic solution   Commonly known as: TRAVATAN   Place 1 drop into both eyes at bedtime.           Follow-up Information    Follow up  with Marykay Lex, MD. (Our office will call will appointment date and time.)    Contact information:   [facility] [address] [place] [address] [phone]          Signed: Dwana Melena 06/30/2011, 1:06 PM

## 2011-06-30 NOTE — Progress Notes (Addendum)
Pt. Seen and examined. Agree with the NP/PA-C note as written.  Doing well post-STEMI. Ambulating ok. On ASA, lipitor, lopressor and effient.  Has had some bradycardia. Recommend switching to Toprol XL 12.5 mg (reduced dose)  and adding low-dose ACE-I (lisinopril 5 mg).  INR was likely elevated due to angiomax. Re-check today. Likely d/c later today.  Chrystie Nose, MD, [facility] Attending Cardiologist [facility]

## 2011-06-30 NOTE — Progress Notes (Signed)
[facility]  Subjective: No complaints.  Ambulating without difficulty.   Objective: Vital signs in last 24 hours: Temp:  [98.2 F (36.8 C)-98.8 F (37.1 C)] 98.5 F (36.9 C) (03/30 0500) Pulse Rate:  [56-66] 56  (03/30 0500) Resp:  [13-18] 18  (03/30 0500) BP: (110-134)/(71-87) 133/87 mmHg (03/30 0500) SpO2:  [96 %-97 %] 96 % (03/30 0500) Last BM Date: 06/29/11  Intake/Output from previous day: 03/29 0701 - 03/30 0700 In: 360 [P.O.:360] Out: 650 [Urine:650] Intake/Output this shift: Total I/O In: 360 [P.O.:360] Out: -   Medications Current Facility-Administered Medications  Medication Dose Route Frequency Provider Last Rate Last Dose  . acetaminophen (TYLENOL) tablet 650 mg  650 mg Oral Q4H PRN Marykay Lex, MD   650 mg at 06/30/11 0925  . ALPRAZolam Prudy Feeler) tablet 0.25 mg  0.25 mg Oral TID PRN Abelino Derrick, PA      . aspirin chewable tablet 81 mg  81 mg Oral Daily Marykay Lex, MD   81 mg at 06/30/11 0929  . atorvastatin (LIPITOR) tablet 40 mg  40 mg Oral q1800 Marykay Lex, MD   40 mg at 06/29/11 1746  . dutasteride (AVODART) capsule 0.5 mg  0.5 mg Oral Daily Eda Paschal [place], [place]   0.5 mg at 06/30/11 0926  . fluticasone (FLONASE) 50 MCG/ACT nasal spray 1 spray  1 spray Each Nare Daily PRN Abelino Derrick, PA      . ibuprofen (ADVIL,MOTRIN) tablet 200 mg  200 mg Oral Q6H PRN Abelino Derrick, PA      . metoprolol tartrate (LOPRESSOR) tablet 12.5 mg  12.5 mg Oral BID Abelino Derrick, PA   12.5 mg at 06/30/11 0925  . mulitivitamin with minerals tablet 1 tablet  1 tablet Oral Daily Marykay Lex, MD   1 tablet at 06/30/11 [phone]  . ondansetron (ZOFRAN) injection 4 mg  4 mg Intravenous Q6H PRN Marykay Lex, MD      . pantoprazole (PROTONIX) EC tablet 40 mg  40 mg Oral [id number] [place], [place]   40 mg at 06/30/11 1308  . PARoxetine (PAXIL) tablet 10 mg  10 mg Oral Q breakfast Eda Paschal [place], [place]   10 mg at 06/30/11 0755  . prasugrel (EFFIENT)  tablet 10 mg  10 mg Oral Daily Marykay Lex, MD   10 mg at 06/30/11 6578  . sodium chloride 0.9 % injection 3 mL  3 mL Intravenous Q12H Marykay Lex, MD   3 mL at 06/29/11 2130  . sodium chloride 0.9 % injection 3 mL  3 mL Intravenous PRN Marykay Lex, MD      . travoprost (benzalkonium) (TRAVATAN) 0.004 % ophthalmic solution 1 drop  1 drop Both Eyes QHS Marykay Lex, MD   1 drop at 06/29/11 2129  . zolpidem (AMBIEN) tablet 10 mg  10 mg Oral QHS PRN Abelino Derrick, PA   10 mg at 06/28/11 2241    PE: General appearance: alert, cooperative and no distress Lungs: clear to auscultation bilaterally Heart: regular rate and rhythm, S1, S2 normal, no murmur, click, rub or gallop Extremities: No LEE Pulses: 2+ and symmetric Skin:  No hematoma, erythema, ecchymosis or bruit at cath site.  Lab Results:   Basename 06/29/11 0605 06/28/11 1411 06/28/11 1410  WBC 7.1 -- 6.2  HGB 14.3 13.6 14.1  HCT 39.8 40.0 37.0*  PLT 154 -- 161   BMET  Basename 06/29/11  1610 06/28/11 1411 06/28/11 1410  NA 140 141 137  K 4.1 3.2* 3.1*  CL 104 104 99  CO2 29 -- 24  GLUCOSE 106* 136* 131*  BUN 18 20 22   CREATININE 0.79 0.90 0.80  CALCIUM 9.2 -- 9.0   PT/INR  Basename 06/28/11 1410  LABPROT >90.0*  INR >10.00*   Cholesterol  Basename 06/29/11 0605  CHOL 181   Studies/Results: Study Conclusions  - Left ventricle: The cavity size was normal. Systolic function was normal. The estimated ejection fraction was in the range of 55% to 60%. Doppler parameters are consistent with abnormal left ventricular relaxation (grade 1 diastolic dysfunction). - Regional wall motion abnormality: Mild hypokinesis of the basal-mid inferoseptal myocardium. Impressions: - No evidence of significant valve disease.  Assessment/Plan  Principal Problem:  *STEMI (ST elevation myocardial infarction) Active Problems:  HTN (hypertension)  Anxiety  BPH (benign prostatic hyperplasia)  GERD (gastroesophageal  reflux disease)  Presence of stent in right coronary artery - Promus DES 3.0 mm x 24 mm (post-dilated to 3.52mm)  Plan: S/P emergent left heart cath for STEMI. Total RCA. Promus DES. ASA/Effient/statin. Trop trending down. EF 55-60% Potassium improved. Cardiac rehab. Discussed diet and exercise.  2D Echo shows preserved EF 55-60%, grade one diastolic dysfunction, Mild hypokinesis of the basal-mid inferoseptal myocardium.  Initial INR on 3/28 was > 10.0.  Will recheck this AM prior to DC which will likely be today.  Likely normal.  Follow-up with Dr. Herbie Baltimore 1-2 weeks.     LOS: 2 days    Isidra Mings W 06/30/2011 10:01 AM

## 2011-06-30 NOTE — Progress Notes (Signed)
Patient ID: [name], male   DOB: 01-11-1956, 56 y.o.   MRN: [id number] Pt. Discharged 06/30/2011  12:29 PM Discharge instructions reviewed with patient/family. Patient/family verbalized understanding. All Rx's given. Questions answered as needed. Pt. Discharged to home with family/self.  Lurline Idol [facility]

## 2011-06-30 NOTE — Progress Notes (Signed)
CARDIAC REHAB PHASE I   PRE:  Rate/Rhythm: 62 SR  BP:  Supine:   Sitting: 132/86  Standing:    SaO2: 92 RA  MODE:  Ambulation: 300 ft   POST:  Rate/Rhythem: 66 SR  BP:  Supine:   Sitting: 132/92  Standing:    SaO2:  Pt understood Ed from previous visit, had no questions. Pt tolerated walk well. Appears eager to get home. [phone] [place]

## 2011-06-30 NOTE — Discharge Summary (Signed)
Brent Polson C. Virga Haltiwanger, MD, FACC Attending Cardiologist [facility]  

## 2012-08-14 ENCOUNTER — Ambulatory Visit (HOSPITAL_COMMUNITY)
Admission: AD | Admit: 2012-08-14 | Discharge: 2012-08-14 | Disposition: A | Discharge status: Home / Self Care | Payer: Federal, State, Local not specified - PPO | Source: Ambulatory Visit | Attending: Cardiology | Admitting: Cardiology

## 2012-08-14 DIAGNOSIS — K219 Gastro-esophageal reflux disease without esophagitis: Secondary | ICD-10-CM | POA: Insufficient documentation

## 2012-08-14 DIAGNOSIS — I10 Essential (primary) hypertension: Secondary | ICD-10-CM | POA: Insufficient documentation

## 2012-08-14 DIAGNOSIS — I219 Acute myocardial infarction, unspecified: Secondary | ICD-10-CM | POA: Insufficient documentation

## 2012-08-14 DIAGNOSIS — N400 Enlarged prostate without lower urinary tract symptoms: Secondary | ICD-10-CM | POA: Insufficient documentation

## 2012-08-14 DIAGNOSIS — F411 Generalized anxiety disorder: Secondary | ICD-10-CM | POA: Insufficient documentation

## 2012-08-14 LAB — PLATELET INHIBITION P2Y12: Platelet Function  P2Y12: 165 [PRU] — ABNORMAL LOW (ref 194–418)

## 2012-10-20 ENCOUNTER — Other Ambulatory Visit: Payer: Self-pay | Admitting: Cardiology

## 2012-10-23 ENCOUNTER — Other Ambulatory Visit: Payer: Self-pay | Admitting: *Deleted

## 2013-02-17 ENCOUNTER — Other Ambulatory Visit: Payer: Self-pay | Admitting: Physician Assistant

## 2013-02-17 NOTE — Telephone Encounter (Signed)
Rx was sent to pharmacy electronically. 

## 2013-05-21 ENCOUNTER — Other Ambulatory Visit: Payer: Self-pay | Admitting: Cardiology

## 2013-05-25 NOTE — Telephone Encounter (Signed)
Forward to med rec  Need paper chart 

## 2013-05-25 NOTE — Telephone Encounter (Signed)
E-SENT RX 

## 2013-05-26 ENCOUNTER — Telehealth: Payer: Self-pay | Admitting: *Deleted

## 2013-05-26 NOTE — Telephone Encounter (Signed)
Refills just sent into pharmacy last week and confirmed by pharmacy yesterday. #30 with 4 refills.

## 2013-05-26 NOTE — Telephone Encounter (Signed)
Pt needs to have 2 Rx refills on his medication he needs to have Lisinopril and Plavix refilled. He has an appointment with Dr. Herbie Baltimore on 4/10.  DH

## 2013-06-22 ENCOUNTER — Other Ambulatory Visit: Payer: Self-pay | Admitting: Cardiology

## 2013-06-22 NOTE — Telephone Encounter (Signed)
Rx was sent to pharmacy electronically. 

## 2013-07-08 ENCOUNTER — Encounter: Payer: Self-pay | Admitting: *Deleted

## 2013-07-10 ENCOUNTER — Ambulatory Visit (INDEPENDENT_AMBULATORY_CARE_PROVIDER_SITE_OTHER): Payer: Federal, State, Local not specified - PPO | Admitting: Cardiology

## 2013-07-10 ENCOUNTER — Encounter: Payer: Self-pay | Admitting: Cardiology

## 2013-07-10 VITALS — BP 128/90 | HR 62 | Ht 70.0 in | Wt 192.1 lb

## 2013-07-10 DIAGNOSIS — I10 Essential (primary) hypertension: Secondary | ICD-10-CM

## 2013-07-10 DIAGNOSIS — I219 Acute myocardial infarction, unspecified: Secondary | ICD-10-CM

## 2013-07-10 DIAGNOSIS — I2119 ST elevation (STEMI) myocardial infarction involving other coronary artery of inferior wall: Secondary | ICD-10-CM

## 2013-07-10 DIAGNOSIS — E785 Hyperlipidemia, unspecified: Secondary | ICD-10-CM

## 2013-07-10 DIAGNOSIS — Z955 Presence of coronary angioplasty implant and graft: Secondary | ICD-10-CM

## 2013-07-10 DIAGNOSIS — Z9861 Coronary angioplasty status: Secondary | ICD-10-CM

## 2013-07-10 DIAGNOSIS — I2510 Atherosclerotic heart disease of native coronary artery without angina pectoris: Secondary | ICD-10-CM

## 2013-07-10 MED ORDER — LISINOPRIL 2.5 MG PO TABS: 2.5000 mg | ORAL_TABLET | Freq: Every day | ORAL | Status: DC

## 2013-07-10 MED ORDER — ATORVASTATIN CALCIUM 20 MG PO TABS: 20.0000 mg | ORAL_TABLET | Freq: Every day | ORAL | Status: DC

## 2013-07-10 NOTE — Patient Instructions (Signed)
No change in medication  Your physician wants you to follow-up in 12 month DR HARDING.  You will receive a reminder letter in the mail two months in advance. If you don't receive a letter, please call our office to schedule the follow-up appointment.

## 2013-07-12 ENCOUNTER — Encounter: Payer: Self-pay | Admitting: Cardiology

## 2013-07-12 DIAGNOSIS — I2510 Atherosclerotic heart disease of native coronary artery without angina pectoris: Secondary | ICD-10-CM | POA: Insufficient documentation

## 2013-07-12 DIAGNOSIS — E785 Hyperlipidemia, unspecified: Secondary | ICD-10-CM | POA: Insufficient documentation

## 2013-07-12 DIAGNOSIS — Z9861 Coronary angioplasty status: Principal | ICD-10-CM

## 2013-07-12 NOTE — Assessment & Plan Note (Signed)
No adverse effect from the MI. No heart failure symptoms with a normal EF. No active anginal symptoms. I think he is now beyond the initial stage where he is worried about every symptom in his chest, and has gotten away from his diligent exercise routine. He is hoping that with his new golfing partner he will get back into doing more exercise. He is also to return to walking with work.

## 2013-07-12 NOTE — Assessment & Plan Note (Signed)
Status post PCI to the RCA. And DES stent and is currently on aspirin plus Plavix. If he were to have any significant bruising or nosebleeds etc., I think we could probably stop the aspirin and continue on the Plavix. I would choose to use Plavix have been because he does have moderate disease in his other coronary arteries.  He is also currently on a statin as well as ACE inhibitor. He was not on a beta blocker in the past as a bradycardia. With a resting heart rate in the low 60's, I'm not inclined to start one.

## 2013-07-12 NOTE — Assessment & Plan Note (Signed)
Have been well controlled in the past. He was supposed to get an NMR panel checked after last visit, but I do not have those results.   We did not order labs during his visit, but will contact him to get them ordered (provided that his PCP has not recently checked them) -- would like an NMR panel to confirm the accuracy of the screening tests.

## 2013-07-12 NOTE — Progress Notes (Signed)
PATIENT: [name] MRN: [id number]  DOB: 08-28-1955   DOV:07/12/2013 PCP: No PCP Per Patient  Clinic Note: Chief Complaint  Patient presents with  . Annual Exam    no chest pain , no sob , no edema    HPI: [name] is a 58 y.o.  male with a PMH below who presents today for annual followup. I last saw him in April 2014. That was a one-year anniversary roughly of his inferior STEMI back in March of 2013. He initially did really well with cardiac rehabilitation following his hospitalization, but sort of got off the band wagon not exercising. His wife has been chastising him for not being as active. Saying that he sleeps a lot during the day when he gets off work. During my last visit with him, we switched him over from Effient to Plavix after an satisfactory P2Y12 Inhibition Assay result.  Interval History: Today he comes in doing quite well overall from a cardiac standpoint. He technology is not doing as well with exercises he had. But for the most part is doing fairly well. Wife is concerned about his evening nap, and lack of desire to get out and exercise. He is simply states that he is tired when he gets off work, and his to refresh his battery. From a cardiac standpoint, he denies any resting or exertional chest tightness/pressure consistent with angina. No dyspnea with rest or exertion. No heart failure symptoms of PND, orthopnea or edema. No palpitations, lightheadedness, dizziness, weakness or syncope/near syncope. No TIA/amaurosis fugax symptoms. No melena, hematochezia, hematuria, or epistaxis. No claudication  Past Medical History  Diagnosis Date  . CAD (coronary artery disease)   . ST elevation myocardial infarction (STEMI) of inferior wall march 2013    100% occluded RCA; PCI - 3.0 mm-x-70mm Promus DES, postdilated to 3.5-3.6 mm; Moderate disease in LAD & Cx; EF 55-60% with basal inferoseptal HK confirmed by echo   . CAD S/P percutaneous coronary angioplasty    mid RCA PCI; moderate disease of LAD and  Cx  . Hypertension   . Dyslipidemia, goal LDL below 70   . Dizziness of unknown cause     abn MRI, Neuro w/u Jan 2011  . Anxiety   . BPH (benign prostatic hyperplasia)   . GERD (gastroesophageal reflux disease)    Prior Cardiac Evaluation and Past Surgical History: Past Surgical History  Procedure Laterality Date  . Shoulder arthroscopy  July 2011  . Percutaneous coronary stent intervention (pci-s)  06/2011    100% occluded RCA treated with a 3.20-mm-x-24-mm Promus DES stent postdilated up to 3.5 mm. He also had a moderate lesion in the LAD and circumflex. EF was relatively normal with mild inferior hypokinesis.  . Foot surgery  2004  . Transthoracic echocardiogram  March 2013    EF 55%-60% with mild basilar inferoseptal hypokinesis also noted on LV-gram   No Known Allergies  Current Outpatient Prescriptions  Medication Sig Dispense Refill  . aspirin 81 MG tablet Take 81 mg by mouth daily.      . clonazePAM (KLONOPIN) 1 MG tablet Take 1 mg by mouth 2 (two) times daily as needed. To help sleep per patient      . clopidogrel (PLAVIX) 75 MG tablet TAKE 1 TABLET BY MOUTH DAILY.  30 tablet  1  . finasteride (PROSCAR) 5 MG tablet Take 5 mg by mouth daily.      . fluticasone (FLONASE) 50 MCG/ACT nasal spray Place 1 spray into the  nose daily as needed. For allergies      . hydroxychloroquine (PLAQUENIL) 200 MG tablet Take by mouth 2 (two) times daily.      Marland Kitchen PARoxetine (PAXIL) 10 MG tablet Take 1 and 1/2 tablets a day      . atorvastatin (LIPITOR) 20 MG tablet Take 1 tablet (20 mg total) by mouth daily.  90 tablet  3  . lisinopril (PRINIVIL,ZESTRIL) 2.5 MG tablet Take 1 tablet (2.5 mg total) by mouth daily.  90 tablet  3   No current facility-administered medications for this visit.    History   Social History Narrative   Married father of 2. Never smoked. Does not drink alcohol significantly.   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   He does play golf on a fairly regular basis, but is currently not walking between holes, but riding. He is starting to play with a new partner who likes to walk, so he is hoping to get more walking.   ROS: A comprehensive Review of Systems - Negative except lack of "desire to do things", has seasonal allergies.mild arthralgias  PHYSICAL EXAM BP 128/90  Pulse 62  Ht 5\' 10"  (1.778 m)  Wt 192 lb 1.6 oz (87.136 kg)  BMI 27.56 kg/m2 General appearance: alert, cooperative, appears stated age, no distress and Healthy-appearing, well-nourished/well-groomed. Present mood and affect Neck: no adenopathy, no carotid bruit, no JVD and supple, symmetrical, trachea midline Lungs: clear to auscultation bilaterally, normal percussion bilaterally and Nonlabored, good air movement Heart: regular rate and rhythm, S1, S2 normal, no murmur, click, rub or gallop and normal apical impulse Abdomen: soft, non-tender; bowel sounds normal; no masses,  no organomegaly Extremities: extremities normal, atraumatic, no cyanosis or edema, no edema, redness or tenderness in the calves or thighs and no ulcers, gangrene or trophic changes Pulses: 2+ and symmetric Neurologic: Alert and oriented X 3, normal strength and tone. Normal symmetric reflexes. Normal coordination and gait   Adult ECG Report  Rate: 62 ;  Rhythm: normal sinus rhythm and With first degree AV block  QRS Axis: 1 ;  PR Interval: 220 ;  QRS Duration: 100 ; QTc: 408  Voltages: High in lead 1 (borderline LVH close  Conduction Disturbances: first-degree A-V block   Other Abnormalities: none   Narrative Interpretation: Essentially normal EKG. No significant change  Recent Labs: No recent labs. Monitored by PCP.  ASSESSMENT / PLAN: History of ST elevation myocardial infarction (STEMI) of inferior wall No adverse effect from the MI. No heart failure symptoms with a  normal EF. No active anginal symptoms. I think he is now beyond the initial stage where he is worried about every symptom in his chest, and has gotten away from his diligent exercise routine. He is hoping that with his new golfing partner he will get back into doing more exercise. He is also to return to walking with work.  CAD S/P percutaneous coronary angioplasty Status post PCI to the RCA. And DES stent and is currently on aspirin plus Plavix. If he were to have any significant bruising or nosebleeds etc., I think we could probably stop the aspirin and continue on the Plavix. I would choose to use Plavix have been because he does have moderate disease in his other coronary arteries.  He is also currently on a statin as well as ACE inhibitor. He was not on a  beta blocker in the past as a bradycardia. With a resting heart rate in the low 60's, I'm not inclined to start one.  Hypertension Well controlled on ACE inhibitor alone.  Dyslipidemia, goal LDL below 70 Have been well controlled in the past. He was supposed to get an NMR panel checked after last visit, but I do not have those results.   We did not order labs during his visit, but will contact him to get them ordered (provided that his PCP has not recently checked them) -- would like an NMR panel to confirm the accuracy of the screening tests.    Orders Placed This Encounter  Procedures  . EKG 12-Lead   Followup: One year  DAVID W. Herbie Baltimore, M.D., M.S. Interventional Cardiology [facility]

## 2013-07-12 NOTE — Assessment & Plan Note (Signed)
Well controlled on ACE inhibitor alone.

## 2013-07-24 ENCOUNTER — Encounter: Payer: Self-pay | Admitting: Cardiology

## 2013-08-24 ENCOUNTER — Other Ambulatory Visit: Payer: Self-pay | Admitting: Cardiology

## 2013-08-25 NOTE — Telephone Encounter (Signed)
Rx was sent to pharmacy electronically. 

## 2013-10-05 ENCOUNTER — Ambulatory Visit (INDEPENDENT_AMBULATORY_CARE_PROVIDER_SITE_OTHER): Payer: Federal, State, Local not specified - PPO | Admitting: *Deleted

## 2013-10-05 VITALS — BP 136/102 | HR 77 | Ht 70.0 in | Wt 199.1 lb

## 2013-10-05 DIAGNOSIS — I2510 Atherosclerotic heart disease of native coronary artery without angina pectoris: Secondary | ICD-10-CM

## 2013-10-05 DIAGNOSIS — R0789 Other chest pain: Secondary | ICD-10-CM

## 2013-10-05 DIAGNOSIS — Z9861 Coronary angioplasty status: Secondary | ICD-10-CM

## 2013-10-05 MED ORDER — LISINOPRIL 5 MG PO TABS: 5.0000 mg | ORAL_TABLET | Freq: Every day | ORAL | Status: DC

## 2013-10-05 NOTE — Patient Instructions (Signed)
INCREASE LISINOPRIL TO 5 MG DAILY.  Your physician has requested that you have en exercise stress myoview. For further information please visit [email or web address] Please follow instruction sheet, as given.  WE WILL CALL WITH THE RESULTS.  Your physician recommends that you schedule a follow-up appointment in: DR. HARDING AFTER THE STRESS TEST.

## 2013-10-06 NOTE — Progress Notes (Signed)
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  Felt similar the morning of his MI last year.  Concerned it may be an indication of another event.  EKG done and reviewed by Dr. [place] -no changes.  Ordered an exercise myoview then follow-up appt.

## 2013-10-07 ENCOUNTER — Telehealth (HOSPITAL_COMMUNITY): Payer: Self-pay

## 2013-10-07 NOTE — Telephone Encounter (Signed)
Encounter complete. 

## 2013-10-08 ENCOUNTER — Telehealth (HOSPITAL_COMMUNITY): Payer: Self-pay

## 2013-10-08 NOTE — Telephone Encounter (Signed)
Encounter completed.

## 2013-10-09 ENCOUNTER — Ambulatory Visit (HOSPITAL_COMMUNITY)
Admission: RE | Admit: 2013-10-09 | Discharge: 2013-10-09 | Disposition: A | Discharge status: Home / Self Care | Payer: Federal, State, Local not specified - PPO | Source: Ambulatory Visit | Attending: Cardiovascular Disease | Admitting: Cardiovascular Disease

## 2013-10-09 DIAGNOSIS — I2510 Atherosclerotic heart disease of native coronary artery without angina pectoris: Secondary | ICD-10-CM

## 2013-10-09 DIAGNOSIS — R0789 Other chest pain: Secondary | ICD-10-CM

## 2013-10-09 DIAGNOSIS — Z9861 Coronary angioplasty status: Secondary | ICD-10-CM | POA: Insufficient documentation

## 2013-10-09 MED ORDER — TECHNETIUM TC 99M SESTAMIBI GENERIC - CARDIOLITE
10.4000 | Freq: Once | INTRAVENOUS | Status: AC | PRN
  Administered 2013-10-09: 10 via INTRAVENOUS

## 2013-10-09 MED ORDER — TECHNETIUM TC 99M SESTAMIBI GENERIC - CARDIOLITE
31.2000 | Freq: Once | INTRAVENOUS | Status: AC | PRN
  Administered 2013-10-09: 31.2 via INTRAVENOUS

## 2013-10-09 NOTE — Procedures (Addendum)
[place] [facility] [address] [place] [address] [phone]  Cardiology Nuclear Med Study  Fedrick Cefalu Yakel is a 58 y.o. male     MRN : [id number]     DOB: [birth year]  Procedure Date: 10/09/2013  Nuclear Med Background Indication for Stress Test:  Evaluation for Ischemia and Stent Patency History:  CAD;MI-06/2011;STENT/PTCA-06/2011;ECHO 06/2011;EF=55-60% Cardiac Risk Factors: Family History - CAD, Hypertension, Lipids and Overweight  Symptoms:  Chest Pain, Dizziness, DOE, Fatigue, Light-Headedness, Palpitations and SOB   Nuclear Pre-Procedure Caffeine/Decaff Intake:  7:00pm NPO After: 5:00am   IV Site: R Antecubital  IV 0.9% NS with Angio Cath:  22g  Chest Size (in):  46"  IV Started by: Berdie Ogren, RN  Height: 5\' 10"  (1.778 m)  Cup Size: n/a  BMI:  Body mass index is 28.55 kg/(m^2). Weight:  199 lb (90.266 kg)   Tech Comments:  n/a    Nuclear Med Study 1 or 2 day study: 1 day  Stress Test Type:  Stress  Order Authorizing Provider:  , MD   Resting Radionuclide: Technetium 26m Sestamibi  Resting Radionuclide Dose: 10.4 mCi   Stress Radionuclide:  Technetium 57m Sestamibi  Stress Radionuclide Dose: 31.2 mCi           Stress Protocol Rest HR: 66 Stress HR: 153  Rest BP: 136/101 Stress BP:202/90  Exercise Time (min): 12:00 METS: 13.40   Predicted Max HR: 162 bpm % Max HR: 94.44 bpm Rate Pressure Product: 84m  Dose of Adenosine (mg):  n/a Dose of Lexiscan: n/a mg  Dose of Atropine (mg): n/a Dose of Dobutamine: n/a mcg/kg/min (at max HR)  Stress Test Technologist: [id number], CCT Nuclear Technologist: Ernestene Mention, CNMT   Rest Procedure:  Myocardial perfusion imaging was performed at rest 45 minutes following the intravenous administration of Technetium 61m Sestamibi. Stress Procedure:  The patient performed treadmill exercise using a Bruce  Protocol for 12 minutes. The patient stopped due to  generalized fatigue. Patient denied any chest pain.  There were no significant ST-T wave changes.  Technetium 58m Sestamibi was injected IV at peak exercise and myocardial perfusion imaging was performed after a brief delay.  Transient Ischemic Dilatation (Normal <1.22):  1.16  QGS EDV:  114 ml QGS ESV:  60 ml LV Ejection Fraction: 47%        Rest ECG: NSR - Normal EKG  Stress ECG: No significant change from baseline ECG  QPS Raw Data Images:  Normal; no motion artifact; normal heart/lung ratio. Stress Images:  There is decreased uptake in the anterior wall. Rest Images:  Normal homogeneous uptake in all areas of the myocardium. Subtraction (SDS):  These findings are consistent with ischemia.  Impression Exercise Capacity:  Excellent exercise capacity. BP Response:  Normal blood pressure response. Clinical Symptoms:  No significant symptoms noted. ECG Impression:  No significant ST segment change suggestive of ischemia. Comparison with Prior Nuclear Study: No previous nuclear study performed  Overall Impression:  Intermediate risk stress nuclear study Septal and anterolateral ischemia. Follow up with Dr. 84m.  LV Wall Motion:  NL LV Function; NL Wall Motion   Hinton Lovely, MD  10/09/2013 6:09 PM

## 2013-10-14 ENCOUNTER — Encounter: Payer: Self-pay | Admitting: Cardiology

## 2013-10-14 ENCOUNTER — Other Ambulatory Visit: Payer: Self-pay | Admitting: *Deleted

## 2013-10-14 ENCOUNTER — Telehealth: Payer: Self-pay | Admitting: *Deleted

## 2013-10-14 DIAGNOSIS — R0789 Other chest pain: Secondary | ICD-10-CM

## 2013-10-14 DIAGNOSIS — R9439 Abnormal result of other cardiovascular function study: Secondary | ICD-10-CM

## 2013-10-14 NOTE — Telephone Encounter (Signed)
LMTC at home and cell #'s.  Unable to reach at work #.

## 2013-10-14 NOTE — Telephone Encounter (Signed)
Discussed results of abnormal nuc results with patient.  Will see Dr. Antoine Poche Friday and cardiac cath has been scheduled for 7/21 to be done by Dr. Herbie Baltimore.  Patient voiced understanding.

## 2013-10-14 NOTE — Telephone Encounter (Signed)
Message copied by Vita Barley on Wed Oct 14, 2013 10:10 AM ------      Message from: Marykay Lex      Created: Tue Oct 13, 2013  4:56 PM       As we were concerned about - abnormal Stress test.  Looks like the moderate LAD disease has progressed.      Will need to have him come in to be seen to schedule cath this week -- can see APP.  I can do cath next Tues or Thurs.  If still having symptoms, needs to be sooner.            Marykay Lex, MD       ------

## 2013-10-15 ENCOUNTER — Telehealth: Payer: Self-pay | Admitting: Cardiology

## 2013-10-15 NOTE — Telephone Encounter (Signed)
Patient was notified of results 7/15 by B. Lassiter, CMA - has appmt with Dr. Antoine Poche on 7/17 - cath scheduled

## 2013-10-15 NOTE — Telephone Encounter (Signed)
Does patient need to schedule a follow up appointment with Dr. Herbie Baltimore to get his stress test results?

## 2013-10-16 ENCOUNTER — Encounter (HOSPITAL_COMMUNITY): Payer: Self-pay | Admitting: Pharmacy Technician

## 2013-10-16 ENCOUNTER — Other Ambulatory Visit: Payer: Self-pay | Admitting: *Deleted

## 2013-10-16 ENCOUNTER — Ambulatory Visit (INDEPENDENT_AMBULATORY_CARE_PROVIDER_SITE_OTHER): Payer: Federal, State, Local not specified - PPO | Admitting: Cardiology

## 2013-10-16 ENCOUNTER — Encounter: Payer: Self-pay | Admitting: Cardiology

## 2013-10-16 VITALS — BP 121/78 | HR 72 | Ht 70.0 in | Wt 201.0 lb

## 2013-10-16 DIAGNOSIS — R5383 Other fatigue: Secondary | ICD-10-CM

## 2013-10-16 DIAGNOSIS — I2119 ST elevation (STEMI) myocardial infarction involving other coronary artery of inferior wall: Secondary | ICD-10-CM

## 2013-10-16 DIAGNOSIS — I2510 Atherosclerotic heart disease of native coronary artery without angina pectoris: Secondary | ICD-10-CM

## 2013-10-16 DIAGNOSIS — Z01810 Encounter for preprocedural cardiovascular examination: Secondary | ICD-10-CM

## 2013-10-16 DIAGNOSIS — I10 Essential (primary) hypertension: Secondary | ICD-10-CM

## 2013-10-16 DIAGNOSIS — Z9861 Coronary angioplasty status: Secondary | ICD-10-CM

## 2013-10-16 DIAGNOSIS — R5381 Other malaise: Secondary | ICD-10-CM

## 2013-10-16 DIAGNOSIS — Z79899 Other long term (current) drug therapy: Secondary | ICD-10-CM

## 2013-10-16 DIAGNOSIS — D689 Coagulation defect, unspecified: Secondary | ICD-10-CM

## 2013-10-16 LAB — CBC
HCT: 40.6 % (ref 39.0–52.0)
HEMOGLOBIN: 14.6 g/dL (ref 13.0–17.0)
MCH: 30.9 pg (ref 26.0–34.0)
MCHC: 36 g/dL (ref 30.0–36.0)
MCV: 86 fL (ref 78.0–100.0)
PLATELETS: 163 10*3/uL (ref 150–400)
RBC: 4.72 MIL/uL (ref 4.22–5.81)
RDW: 13.6 % (ref 11.5–15.5)
WBC: 6.9 10*3/uL (ref 4.0–10.5)

## 2013-10-16 NOTE — Progress Notes (Signed)
HPI The patient sense for evaluation of an abnormal stress test. He has coronary artery disease with inferior myocardial infarction in 2013.  He was seen by Dr. Herbie Baltimore in the spring.  He was doing well at that time and earlier this month called with some chest discomfort.  He was somewhat vague about this but he felt some mild discomfort that he thought was similar possibly to the day he had a heart attack. However, he dropped in for an EKG and this was not remarkable. He has however since that time felt fatigued. He's had decreased exercise tolerance and has wanted to sleep more. He has not had any classic chest pressure, neck or arm discomfort. He said no new palpitations, presyncope or syncope. He has had no PND or orthopnea.  He was sent for a stress perfusion study which demonstrated possible old anterior septal ischemia. This was interpreted as intermediate risk.   Current Outpatient Prescriptions  Medication Sig Dispense Refill  . aspirin 81 MG tablet Take 81 mg by mouth daily.      Marland Kitchen atorvastatin (LIPITOR) 20 MG tablet Take 1 tablet (20 mg total) by mouth daily.  90 tablet  3  . clonazePAM (KLONOPIN) 1 MG tablet Take 1 mg by mouth 2 (two) times daily as needed for anxiety. Sleep/anixety      . clopidogrel (PLAVIX) 75 MG tablet Take 75 mg by mouth daily.      . finasteride (PROSCAR) 5 MG tablet Take 5 mg by mouth daily.      . fluticasone (FLONASE) 50 MCG/ACT nasal spray Place 1 spray into the nose daily as needed. For allergies      . lisinopril (PRINIVIL,ZESTRIL) 5 MG tablet Take 1 tablet (5 mg total) by mouth daily.  90 tablet  3  . PARoxetine (PAXIL) 10 MG tablet Take 15 mg by mouth daily.        No current facility-administered medications for this visit.    Past Medical History  Diagnosis Date  . CAD (coronary artery disease)   . ST elevation myocardial infarction (STEMI) of inferior wall march 2013    100% occluded RCA; PCI - 3.0 mm-x-25mm Promus DES, postdilated to 3.5-3.6  mm; Moderate disease in LAD & Cx; EF 55-60% with basal inferoseptal HK confirmed by echo   . CAD S/P percutaneous coronary angioplasty      mid RCA PCI; moderate disease of LAD and  Cx  . Hypertension   . Dyslipidemia, goal LDL below 70   . Dizziness of unknown cause     abn MRI, Neuro w/u Jan 2011  . Anxiety   . BPH (benign prostatic hyperplasia)   . GERD (gastroesophageal reflux disease)     Past Surgical History  Procedure Laterality Date  . Shoulder arthroscopy  July 2011  . Percutaneous coronary stent intervention (pci-s)  06/2011    100% occluded RCA treated with a 3.20-mm-x-24-mm Promus DES stent postdilated up to 3.5 mm. He also had a moderate lesion in the LAD and circumflex. EF was relatively normal with mild inferior hypokinesis.  . Foot surgery  2004  . Transthoracic echocardiogram  March 2013    EF 55%-60% with mild basilar inferoseptal hypokinesis also noted on LV-gram    ROS:  As stated in the HPI and negative for all other systems.  PHYSICAL EXAM BP 121/78  Pulse 72  Ht 5\' 10"  (1.778 m)  Wt 201 lb (91.173 kg)  BMI 28.84 kg/m2 GENERAL:  Well appearing HEENT:  Pupils equal round and reactive, fundi not visualized, oral mucosa unremarkable NECK:  No jugular venous distention, waveform within normal limits, carotid upstroke brisk and symmetric, no bruits, no thyromegaly LYMPHATICS:  No cervical, inguinal adenopathy LUNGS:  Clear to auscultation bilaterally BACK:  No CVA tenderness CHEST:  Unremarkable HEART:  PMI not displaced or sustained,S1 and S2 within normal limits, no S3, no S4, no clicks, no rubs, no murmurs ABD:  Flat, positive bowel sounds normal in frequency in pitch, no bruits, no rebound, no guarding, no midline pulsatile mass, no hepatomegaly, no splenomegaly EXT:  2 plus pulses throughout, no edema, no cyanosis no clubbing SKIN:  No rashes no nodules NEURO:  Cranial nerves II through XII grossly intact, motor grossly intact throughout PSYCH:   Cognitively intact, oriented to person place and time   ASSESSMENT AND PLAN  CAD:  Given the symptoms and an intermediate risk stress perfusion study cardiac catheterization is indicated. The patient understands that risks included but are not limited to stroke (1 in 1000), death (1 in 1000), kidney failure [usually temporary] (1 in 500), bleeding (1 in 200), allergic reaction [possibly serious] (1 in 200).  The patient understands and agrees to proceed.   HYPERLIPIDEMIA:  This is followed by Dr. Earl Gala.  He will remain on the meds as listed.   I did review his labs and his LDL was 71 his triglycerides 507 area discussed dietary changes.  HTN: The blood pressure is at target. No change in medications is indicated. We will continue with therapeutic lifestyle changes (TLC).  FATIGUE:  I will check pre cath labs to include a CBC and TSH

## 2013-10-17 LAB — COMPLETE METABOLIC PANEL WITH GFR
ALK PHOS: 66 U/L (ref 39–117)
ALT: 28 U/L (ref 0–53)
AST: 24 U/L (ref 0–37)
Albumin: 4.8 g/dL (ref 3.5–5.2)
BILIRUBIN TOTAL: 0.7 mg/dL (ref 0.2–1.2)
BUN: 25 mg/dL — AB (ref 6–23)
CO2: 30 mEq/L (ref 19–32)
Calcium: 9.7 mg/dL (ref 8.4–10.5)
Chloride: 102 mEq/L (ref 96–112)
Creat: 0.89 mg/dL (ref 0.50–1.35)
GFR, Est African American: >89 mL/min
GFR, Est Non African American: >89 mL/min
Glucose, Bld: 93 mg/dL (ref 70–99)
Potassium: 4.5 mEq/L (ref 3.5–5.3)
Sodium: 139 mEq/L (ref 135–145)
Total Protein: 6.9 g/dL (ref 6.0–8.3)

## 2013-10-17 LAB — URINALYSIS, MICROSCOPIC ONLY
BACTERIA UA: NONE SEEN
CASTS: NONE SEEN
Crystals: NONE SEEN
Squamous Epithelial / LPF: NONE SEEN

## 2013-10-17 LAB — PROTIME-INR
INR: 1.07 (ref <1.50)
PROTHROMBIN TIME: 13.9 s (ref 11.6–15.2)

## 2013-10-17 LAB — TSH: TSH: 0.841 u[IU]/mL (ref 0.350–4.500)

## 2013-10-17 LAB — APTT: aPTT: 34 seconds (ref 24–37)

## 2013-10-19 HISTORY — PX: NM MYOVIEW LTD: HXRAD82

## 2013-10-19 NOTE — Progress Notes (Signed)
Pt. Informed of his lab results 

## 2013-10-20 ENCOUNTER — Ambulatory Visit (HOSPITAL_COMMUNITY)
Admission: RE | Admit: 2013-10-20 | Discharge: 2013-10-21 | Disposition: A | Discharge status: Home / Self Care | Payer: Federal, State, Local not specified - PPO | Source: Ambulatory Visit | Attending: Cardiology | Admitting: Cardiology

## 2013-10-20 ENCOUNTER — Encounter (HOSPITAL_COMMUNITY)
Admission: RE | Disposition: A | Discharge status: Home / Self Care | Payer: Self-pay | Source: Ambulatory Visit | Attending: Cardiology

## 2013-10-20 ENCOUNTER — Encounter (HOSPITAL_COMMUNITY): Payer: Self-pay | Admitting: General Practice

## 2013-10-20 DIAGNOSIS — I208 Other forms of angina pectoris: Secondary | ICD-10-CM | POA: Diagnosis present

## 2013-10-20 DIAGNOSIS — Z9861 Coronary angioplasty status: Secondary | ICD-10-CM | POA: Insufficient documentation

## 2013-10-20 DIAGNOSIS — F411 Generalized anxiety disorder: Secondary | ICD-10-CM | POA: Insufficient documentation

## 2013-10-20 DIAGNOSIS — R9439 Abnormal result of other cardiovascular function study: Secondary | ICD-10-CM

## 2013-10-20 DIAGNOSIS — I209 Angina pectoris, unspecified: Secondary | ICD-10-CM | POA: Insufficient documentation

## 2013-10-20 DIAGNOSIS — I2510 Atherosclerotic heart disease of native coronary artery without angina pectoris: Secondary | ICD-10-CM

## 2013-10-20 DIAGNOSIS — Z7982 Long term (current) use of aspirin: Secondary | ICD-10-CM | POA: Insufficient documentation

## 2013-10-20 DIAGNOSIS — E785 Hyperlipidemia, unspecified: Secondary | ICD-10-CM | POA: Insufficient documentation

## 2013-10-20 DIAGNOSIS — R931 Abnormal findings on diagnostic imaging of heart and coronary circulation: Secondary | ICD-10-CM | POA: Diagnosis present

## 2013-10-20 DIAGNOSIS — K219 Gastro-esophageal reflux disease without esophagitis: Secondary | ICD-10-CM | POA: Insufficient documentation

## 2013-10-20 DIAGNOSIS — I2089 Other forms of angina pectoris: Secondary | ICD-10-CM | POA: Diagnosis present

## 2013-10-20 DIAGNOSIS — I252 Old myocardial infarction: Secondary | ICD-10-CM | POA: Insufficient documentation

## 2013-10-20 DIAGNOSIS — I10 Essential (primary) hypertension: Secondary | ICD-10-CM | POA: Diagnosis present

## 2013-10-20 DIAGNOSIS — Z955 Presence of coronary angioplasty implant and graft: Secondary | ICD-10-CM

## 2013-10-20 DIAGNOSIS — Z7902 Long term (current) use of antithrombotics/antiplatelets: Secondary | ICD-10-CM | POA: Insufficient documentation

## 2013-10-20 DIAGNOSIS — R0789 Other chest pain: Secondary | ICD-10-CM

## 2013-10-20 HISTORY — PX: LEFT HEART CATHETERIZATION WITH CORONARY ANGIOGRAM: SHX5451

## 2013-10-20 HISTORY — DX: Major depressive disorder, single episode, unspecified: F32.9

## 2013-10-20 HISTORY — DX: Bipolar disorder, unspecified: F31.9

## 2013-10-20 HISTORY — DX: Rheumatic fever without heart involvement: I00

## 2013-10-20 HISTORY — DX: Rheumatoid arthritis, unspecified: M06.9

## 2013-10-20 HISTORY — DX: Depression, unspecified: F32.A

## 2013-10-20 HISTORY — DX: Unspecified osteoarthritis, unspecified site: M19.90

## 2013-10-20 LAB — POCT ACTIVATED CLOTTING TIME
Activated Clotting Time: 191 seconds
Activated Clotting Time: 230 seconds
Activated Clotting Time: 326 seconds
Activated Clotting Time: 225 seconds

## 2013-10-20 LAB — CK TOTAL AND CKMB (NOT AT ARMC)
CK TOTAL: 90 U/L (ref 7–232)
CK, MB: 1.3 ng/mL (ref 0.3–4.0)
Relative Index: INVALID (ref 0.0–2.5)

## 2013-10-20 SURGERY — LEFT HEART CATHETERIZATION WITH CORONARY ANGIOGRAM
Anesthesia: LOCAL

## 2013-10-20 MED ORDER — HEPARIN (PORCINE) IN NACL 2-0.9 UNIT/ML-% IJ SOLN
INTRAMUSCULAR | Status: AC
  Filled 2013-10-20: qty 1500

## 2013-10-20 MED ORDER — VERAPAMIL HCL 2.5 MG/ML IV SOLN
INTRAVENOUS | Status: AC
  Filled 2013-10-20: qty 2

## 2013-10-20 MED ORDER — LISINOPRIL 5 MG PO TABS
5.0000 mg | ORAL_TABLET | Freq: Every day | ORAL | Status: DC
  Administered 2013-10-21: 5 mg via ORAL
  Filled 2013-10-20: qty 1

## 2013-10-20 MED ORDER — ASPIRIN 81 MG PO TABS
81.0000 mg | ORAL_TABLET | Freq: Every day | ORAL | Status: DC
  Filled 2013-10-20: qty 1

## 2013-10-20 MED ORDER — SODIUM CHLORIDE 0.9 % IV SOLN: 250.0000 mL | INTRAVENOUS | Status: DC | PRN

## 2013-10-20 MED ORDER — ACETAMINOPHEN 325 MG PO TABS
650.0000 mg | ORAL_TABLET | ORAL | Status: DC | PRN
  Administered 2013-10-20: 650 mg via ORAL
  Filled 2013-10-20: qty 2

## 2013-10-20 MED ORDER — SODIUM CHLORIDE 0.9 % IJ SOLN: 3.0000 mL | INTRAMUSCULAR | Status: DC | PRN

## 2013-10-20 MED ORDER — SODIUM CHLORIDE 0.9 % IV SOLN
1.0000 mL/kg/h | INTRAVENOUS | Status: AC
  Administered 2013-10-20: 1 mL/kg/h via INTRAVENOUS

## 2013-10-20 MED ORDER — MORPHINE SULFATE 2 MG/ML IJ SOLN: 2.0000 mg | INTRAMUSCULAR | Status: DC | PRN

## 2013-10-20 MED ORDER — LIDOCAINE HCL (PF) 1 % IJ SOLN
INTRAMUSCULAR | Status: AC
Start: 2013-10-20 — End: 2013-10-20
  Filled 2013-10-20: qty 30

## 2013-10-20 MED ORDER — HEPARIN SODIUM (PORCINE) 1000 UNIT/ML IJ SOLN
INTRAMUSCULAR | Status: AC
  Filled 2013-10-20: qty 1

## 2013-10-20 MED ORDER — NITROGLYCERIN 1 MG/10 ML FOR IR/CATH LAB
INTRA_ARTERIAL | Status: AC
  Filled 2013-10-20: qty 10

## 2013-10-20 MED ORDER — MIDAZOLAM HCL 2 MG/2ML IJ SOLN
INTRAMUSCULAR | Status: AC
  Filled 2013-10-20: qty 2

## 2013-10-20 MED ORDER — ATORVASTATIN CALCIUM 20 MG PO TABS
20.0000 mg | ORAL_TABLET | Freq: Every day | ORAL | Status: DC
  Filled 2013-10-20 (×2): qty 1

## 2013-10-20 MED ORDER — FINASTERIDE 5 MG PO TABS
5.0000 mg | ORAL_TABLET | Freq: Every day | ORAL | Status: DC
  Administered 2013-10-21: 10:00:00 5 mg via ORAL
  Filled 2013-10-20: qty 1

## 2013-10-20 MED ORDER — CLOPIDOGREL BISULFATE 75 MG PO TABS
75.0000 mg | ORAL_TABLET | Freq: Every day | ORAL | Status: DC
  Administered 2013-10-21: 10:00:00 75 mg via ORAL
  Filled 2013-10-20: qty 1

## 2013-10-20 MED ORDER — SODIUM CHLORIDE 0.9 % IV SOLN
INTRAVENOUS | Status: DC
  Administered 2013-10-20: 1000 mL via INTRAVENOUS

## 2013-10-20 MED ORDER — SODIUM CHLORIDE 0.9 % IJ SOLN
3.0000 mL | Freq: Two times a day (BID) | INTRAMUSCULAR | Status: DC
Start: 2013-10-21 — End: 2013-10-20

## 2013-10-20 MED ORDER — ONDANSETRON HCL 4 MG/2ML IJ SOLN: 4.0000 mg | Freq: Four times a day (QID) | INTRAMUSCULAR | Status: DC | PRN

## 2013-10-20 MED ORDER — FENTANYL CITRATE 0.05 MG/ML IJ SOLN
INTRAMUSCULAR | Status: AC
  Filled 2013-10-20: qty 2

## 2013-10-20 MED ORDER — CLONAZEPAM 1 MG PO TABS
1.0000 mg | ORAL_TABLET | Freq: Two times a day (BID) | ORAL | Status: DC | PRN
  Administered 2013-10-20 – 2013-10-21 (×2): 1 mg via ORAL
  Filled 2013-10-20 (×2): qty 1

## 2013-10-20 MED ORDER — ADENOSINE 12 MG/4ML IV SOLN
16.0000 mL | Freq: Once | INTRAVENOUS | Status: DC
  Filled 2013-10-20: qty 16

## 2013-10-20 NOTE — Progress Notes (Signed)
TR BAND REMOVAL  LOCATION:    right radial  DEFLATED PER PROTOCOL:    Yes.    TIME BAND OFF / DRESSING APPLIED:    20:30   SITE UPON ARRIVAL:    Level 0  SITE AFTER BAND REMOVAL:    Level 0  REVERSE ALLEN'S TEST:     positive  CIRCULATION SENSATION AND MOVEMENT:    Within Normal Limits   Yes.    COMMENTS:

## 2013-10-20 NOTE — CV Procedure (Signed)
CARDIAC CATHETERIZATION AND PERCUTANEOUS CORONARY INTERVENTION REPORT  NAME:  [name]   MRN: [id number] DOB:  03-05-1956   ADMIT DATE: 10/20/2013 Procedure Date: 10/20/2013  INTERVENTIONAL CARDIOLOGIST: Leonie Man, M.D., MS PRIMARY CARE PROVIDER: Horton Finer, MD PRIMARY CARDIOLOGIST: Leonie Man, MD, MS  PATIENT:  [name] is a 58 y.o. male with a history of an inferior STEMI treated with RCA PCI in March of 2013. He is on relatively well post PCI but was recently evaluated for symptoms concerning for recurrence of his anginal equivalent of fatigue and reduced exercise tolerance. He was seen by Dr. P Martinique as a walk-in patient and referred for nuclear stress testing which revealed septal and anterolateral ischemia. Given the fact that this was "Intermediate Risk "he then saw Dr. Percival Spanish, who scheduled the patient for invasive evaluation with cardiac catheterization with possible PCI.  PRE-OPERATIVE DIAGNOSIS:    Class III angina  Abnormal (Intermediate Risk) Nuclear Stress Test  PROCEDURES PERFORMED:    Left Heart Catheterization with Native Coronary Angiography  via Right Radial Artery   Left Ventriculography  Fractional Flow Reserve Measurement (FFR) of mid Circumflex  Fractional Flow Reserve Measurement of proximal D1  Percutaneous Coronary Intervention of distal LAD with a Biotronik AG DES 2.5 mm x 13 mm (2.75 mm)  PROCEDURE: The patient was brought to the [facility] in the fasting state and prepped and draped in the usual sterile fashion for Right Radial artery access. A modified Allen's test was performed on the right wrist demonstrating excellent collateral flow for radial access.   Sterile technique was used including antiseptics, cap, gloves, gown, hand hygiene, mask and sheet. Skin prep: Chlorhexidine.   Consent: Risks of procedure as well as the alternatives and risks of each were explained to the  (patient/caregiver). Consent for procedure obtained.   Time Out: Verified patient identification, verified procedure, site/side was marked, verified correct patient position, special equipment/implants available, medications/allergies/relevent history reviewed, required imaging and test results available. Performed.  Access:   Right Radial Artery: 6 Fr Sheath -  Seldinger Technique (Angiocath Micropuncture Kit)  Radial Cocktail - 10 mL; IV Heparin 4500 Units   Left Heart Catheterization: TIG 4.0 Fr Catheters advanced or exchanged over a Web designer J-wire.  Left & Right Coronary Artery Cineangiography: TIG 4.0 Catheter   LV Hemodynamics (LV Gram): Angled pigtail  Sheath removed in the Cardiac Cath Lab with TR band placement for hemostasis.  TR Band: 1650  Hours; 13 mL air  FINDINGS:  Hemodynamics:   Central Aortic Pressure / Mean: 110/69/87 mmHg  Left Ventricular Pressure / LVEDP: 110/0/7 mmHg  Left Ventriculography:  EF: 55-60 %  Wall Motion: Essentially normal  Coronary Anatomy:  Dominance: Right  Left Main: Large caliber vessel that bifurcates into the LAD and Circumflex. Angiographically normal LAD: Large-caliber vessel which reaches down around the apex. It gives rise to a major first diagonal branch and a smaller second diagonal branch. Just beyond D2 in the distal vessel, there is a focal 95-99% subtotal stenosis with TIMI 2 flow distally. The remainder of the vessels relatively free of disease.  D1: Moderate to large caliber vessel with a proximal tubular 50-60% stenosis followed by bifurcation into a larger parent branch and a smaller daughter vessel that has an ostial 60-70% stenosis. The parent vessel is probably a 2.5-2.75 mm vessel where as the daughter is probably 2.0-2.25 mm  D2: Small caliber vessel; angiographic normal. Left Circumflex: Normal, large-caliber nondominant  vessel it bifurcates in the OM2 and the AV groove following vessel. Beyond OM2  there is a focal, "napkin ring "40-50% stenosis that was previously noted. Circumflex then branches into OM 2 and the following OM 3/posterior lateral branch.  OM 1: Small-caliber vessel that covers minimal area, but mild luminal irregularities   OM 2: Major obtuse marginal branch somewhat tortuous with minimal luminal irregular reaches down toward the apex.   OM 3: This is the second real obtuse marginal branch that has minimal luminal irregularities   Left Posterior Lateral Branch: The second branch of the AV groove circumflex that has minimal luminal irregularities   RCA: Large-caliber, dominant vessel with mid vessel tubular 20-40% stenosis followed by widely patent graft beyond the [place]x.  The vessel then continues on distally to bifurcate into the Right Posterior Descending Artery (RPDA) and the Right Posterior AV Groove Branch (RPAV). No significant disease after the stent.  RPDA: This a large branching vessel with minimal luminal irregularities  RPL Sysytem:The RPAV is a relatively small, diminutive vessel with several small branches. The AV nodal branch does arise from this vessel.  After reviewing the initial angiography, the culprit lesion was thought to be the distal LAD 95-99% stenosis.  Preparation were made to proceed with PCI on this lesion. In addition to the LAD lesion there is a moderate lesion in the mid AV Groove Circumflex as well as the proximal portion of the D1. To investigate these 2 lesions the plan was to proceed initially with FFR measurement the first the circumflex followed by the diagonal. The smaller daughter branch of D1 appears out ostial lesion of roughly 60-70%, however this is perhaps a 2.0-2.25 mm vessel and the left significant of the 2 branches. The decision was to investigate the larger parent vessel for significance, if this did show significant than bifurcation PCI/PTCA would be performed, if not, then the plan will be for medical therapy of the smaller  branch vessel. There was lateral ischemia on the stress test which could be easily explained by this small daughter vessel there is not an optimal PCI candidate for long without intervening on the larger branch.  Fractional Flow Reserve Measurement (FFR)  Lesion #1 - Midcircumflex prior to OM 2 bifurcation: 60% lesion  6 [place] XB LAD 3.5 guide catheter;  [place] Primewire was advanced into the distal AV circumflex  Adenosine running 140 mcg/min for 2 minutes -- >   Initial and final FFR 1.0; not physiologically significant  Lesion #2 - proximal D1 into larger parent branch  6 [place] XB LAD 3.5 guide catheter;  [place] Primewire was advanced into the distal AV circumflex  Adenosine running 140 mcg/min for 2 minutes -- >   Initial FFR 1.0; final FFR of 0.86 not physiologically significant  With a nonsignificant stenosis in the proximal diagonal prior to the bifurcation, the decision was made to opt for medical therapy to treat the more significantly stenosed smaller daughter branch.  Percutaneous Coronary Intervention:  Guide: 6 Fr   XB LAD Guidewire: Pro-water Predilation Balloon: Euphora 2.0 mm x 12 mm;   8 Atm x 20 Sec, Stent: Biotronik AG DES 2.5 mm x 13 mm;   14 Atm x 30 Sec,  Post-dilation Balloon: [place] Euphora 2.75 mm x 8 mm;   16 Atm x 45 Sec,  Final Diameter: 2.75 mm  Post deployment angiography in multiple views, with and without guidewire in place revealed excellent stent deployment and lesion coverage.  There was no evidence of dissection  or perforation.  MEDICATIONS:  Anesthesia:  Local Lidocaine 2 ml  Sedation:  2 mg IV Versed, 50 mcg IV fentanyl ;   Premedication: On Plavix at home  Omnipaque Contrast: 190 ml  Anticoagulation:  IV Heparin 4500 Units ; additional 3000 and 2000 Units administered to keep ACT > 250 Sec  Anti-Platelet Agent:  On home Plavix  Intracoronary Nitroglycerin 200 mcg x 5  PATIENT DISPOSITION:    The patient was transferred to  the PACU holding area in a hemodynamicaly stable, chest pain free condition.  The patient tolerated the procedure well, and there were no complications.  EBL:   < 10 ml  The patient was stable before, during, and after the procedure.  POST-OPERATIVE DIAGNOSIS:    Is severe distal LAD 95-99% stenosis with moderate disease of the first diagonal branch and less significant disease in the mid circumflex.  FFR measurement of the first diagonal larger parent branch was not physiologically significant. Angiographically the smaller branch is clearly the culprit for the anterolateral ischemia, but by itself is not a good PCI target.  Widely patent RCA stent  Well-preserved LVEF with normal LVEDP  PLAN OF CARE:  The patient was monitored overnight post PCI with standard post-radial cath care.  Anticipate discharge in the morning.  We'll continue medical therapy of the smaller branch of the diagonal, however if the patient does show signs of not having adequate symptom control with medical therapy, would consider then the bifurcation PCI of the D1 parent and daughter branch.  Continued dual Antiplatelet therapy with aspirin plus Plavix  He will need close followup within 2-3 weeks to determine if his anginal symptoms have improved.   Leonie Man, M.D., M.S. [facility] [address] [place], [place]  [address]  [phone]  10/20/2013 5:01 PM

## 2013-10-20 NOTE — Interval H&P Note (Signed)
History and Physical Interval Note:  10/20/2013 3:12 PM  [name]  has presented today for surgery, with the diagnosis of abnormal nuclear stress test (Intermediate RISK) for Class II Angina.  The various methods of treatment have been discussed with the patient and family. After consideration of risks, benefits and other options for treatment, the patient has consented to  Procedure(s): LEFT HEART CATHETERIZATION WITH CORONARY ANGIOGRAM (N/A) +/- PCI as a surgical intervention .  The patient's history has been reviewed, patient examined, no change in status, stable for surgery.  I have reviewed the patient's chart and labs.  Questions were answered to the patient's satisfaction.     [name] W  Cath Lab Visit (complete for each Cath Lab visit)  Clinical Evaluation Leading to the Procedure:   ACS: No.  Non-ACS:    Anginal Classification: CCS II  Anti-ischemic medical therapy: Maximal Therapy (2 or more classes of medications)  Non-Invasive Test Results: Intermediate-risk stress test findings: cardiac mortality 1-3%/year  Prior CABG: No previous CABG

## 2013-10-20 NOTE — H&P (View-Only) (Signed)
HPI The patient sense for evaluation of an abnormal stress test. He has coronary artery disease with inferior myocardial infarction in 2013.  He was seen by Dr. Herbie Baltimore in the spring.  He was doing well at that time and earlier this month called with some chest discomfort.  He was somewhat vague about this but he felt some mild discomfort that he thought was similar possibly to the day he had a heart attack. However, he dropped in for an EKG and this was not remarkable. He has however since that time felt fatigued. He's had decreased exercise tolerance and has wanted to sleep more. He has not had any classic chest pressure, neck or arm discomfort. He said no new palpitations, presyncope or syncope. He has had no PND or orthopnea.  He was sent for a stress perfusion study which demonstrated possible old anterior septal ischemia. This was interpreted as intermediate risk.   Current Outpatient Prescriptions  Medication Sig Dispense Refill  . aspirin 81 MG tablet Take 81 mg by mouth daily.      Marland Kitchen atorvastatin (LIPITOR) 20 MG tablet Take 1 tablet (20 mg total) by mouth daily.  90 tablet  3  . clonazePAM (KLONOPIN) 1 MG tablet Take 1 mg by mouth 2 (two) times daily as needed for anxiety. Sleep/anixety      . clopidogrel (PLAVIX) 75 MG tablet Take 75 mg by mouth daily.      . finasteride (PROSCAR) 5 MG tablet Take 5 mg by mouth daily.      . fluticasone (FLONASE) 50 MCG/ACT nasal spray Place 1 spray into the nose daily as needed. For allergies      . lisinopril (PRINIVIL,ZESTRIL) 5 MG tablet Take 1 tablet (5 mg total) by mouth daily.  90 tablet  3  . PARoxetine (PAXIL) 10 MG tablet Take 15 mg by mouth daily.        No current facility-administered medications for this visit.    Past Medical History  Diagnosis Date  . CAD (coronary artery disease)   . ST elevation myocardial infarction (STEMI) of inferior wall march 2013    100% occluded RCA; PCI - 3.0 mm-x-25mm Promus DES, postdilated to 3.5-3.6  mm; Moderate disease in LAD & Cx; EF 55-60% with basal inferoseptal HK confirmed by echo   . CAD S/P percutaneous coronary angioplasty      mid RCA PCI; moderate disease of LAD and  Cx  . Hypertension   . Dyslipidemia, goal LDL below 70   . Dizziness of unknown cause     abn MRI, Neuro w/u Jan 2011  . Anxiety   . BPH (benign prostatic hyperplasia)   . GERD (gastroesophageal reflux disease)     Past Surgical History  Procedure Laterality Date  . Shoulder arthroscopy  July 2011  . Percutaneous coronary stent intervention (pci-s)  06/2011    100% occluded RCA treated with a 3.20-mm-x-24-mm Promus DES stent postdilated up to 3.5 mm. He also had a moderate lesion in the LAD and circumflex. EF was relatively normal with mild inferior hypokinesis.  . Foot surgery  2004  . Transthoracic echocardiogram  March 2013    EF 55%-60% with mild basilar inferoseptal hypokinesis also noted on LV-gram    ROS:  As stated in the HPI and negative for all other systems.  PHYSICAL EXAM BP 121/78  Pulse 72  Ht 5\' 10"  (1.778 m)  Wt 201 lb (91.173 kg)  BMI 28.84 kg/m2 GENERAL:  Well appearing HEENT:  Pupils equal round and reactive, fundi not visualized, oral mucosa unremarkable NECK:  No jugular venous distention, waveform within normal limits, carotid upstroke brisk and symmetric, no bruits, no thyromegaly LYMPHATICS:  No cervical, inguinal adenopathy LUNGS:  Clear to auscultation bilaterally BACK:  No CVA tenderness CHEST:  Unremarkable HEART:  PMI not displaced or sustained,S1 and S2 within normal limits, no S3, no S4, no clicks, no rubs, no murmurs ABD:  Flat, positive bowel sounds normal in frequency in pitch, no bruits, no rebound, no guarding, no midline pulsatile mass, no hepatomegaly, no splenomegaly EXT:  2 plus pulses throughout, no edema, no cyanosis no clubbing SKIN:  No rashes no nodules NEURO:  Cranial nerves II through XII grossly intact, motor grossly intact throughout PSYCH:   Cognitively intact, oriented to person place and time   ASSESSMENT AND PLAN  CAD:  Given the symptoms and an intermediate risk stress perfusion study cardiac catheterization is indicated. The patient understands that risks included but are not limited to stroke (1 in 1000), death (1 in 1000), kidney failure [usually temporary] (1 in 500), bleeding (1 in 200), allergic reaction [possibly serious] (1 in 200).  The patient understands and agrees to proceed.   HYPERLIPIDEMIA:  This is followed by Dr. Osborne.  He will remain on the meds as listed.   I did review his labs and his LDL was 71 his triglycerides 507 area discussed dietary changes.  HTN: The blood pressure is at target. No change in medications is indicated. We will continue with therapeutic lifestyle changes (TLC).  FATIGUE:  I will check pre cath labs to include a CBC and TSH   

## 2013-10-21 ENCOUNTER — Encounter (HOSPITAL_COMMUNITY): Payer: Self-pay | Admitting: Cardiology

## 2013-10-21 DIAGNOSIS — R9439 Abnormal result of other cardiovascular function study: Secondary | ICD-10-CM

## 2013-10-21 DIAGNOSIS — I209 Angina pectoris, unspecified: Secondary | ICD-10-CM

## 2013-10-21 DIAGNOSIS — R0789 Other chest pain: Secondary | ICD-10-CM

## 2013-10-21 LAB — BASIC METABOLIC PANEL
ANION GAP: 14 (ref 5–15)
BUN: 19 mg/dL (ref 6–23)
CALCIUM: 9.2 mg/dL (ref 8.4–10.5)
CO2: 26 mEq/L (ref 19–32)
CREATININE: 0.8 mg/dL (ref 0.50–1.35)
Chloride: 100 mEq/L (ref 96–112)
GFR calc non Af Amer: >90 mL/min (ref >90)
Glucose, Bld: 92 mg/dL (ref 70–99)
Potassium: 4.1 mEq/L (ref 3.7–5.3)
Sodium: 140 mEq/L (ref 137–147)

## 2013-10-21 LAB — CBC
HCT: 40.4 % (ref 39.0–52.0)
HEMOGLOBIN: 14.4 g/dL (ref 13.0–17.0)
MCH: 31.6 pg (ref 26.0–34.0)
MCHC: 35.6 g/dL (ref 30.0–36.0)
MCV: 88.8 fL (ref 78.0–100.0)
Platelets: 141 10*3/uL — ABNORMAL LOW (ref 150–400)
RBC: 4.55 MIL/uL (ref 4.22–5.81)
RDW: 13.4 % (ref 11.5–15.5)
WBC: 6.9 10*3/uL (ref 4.0–10.5)

## 2013-10-21 LAB — CK TOTAL AND CKMB (NOT AT ARMC)
CK, MB: 5.1 ng/mL — AB (ref 0.3–4.0)
Relative Index: INVALID (ref 0.0–2.5)
Total CK: 91 U/L (ref 7–232)

## 2013-10-21 MED ORDER — ASPIRIN EC 81 MG PO TBEC
81.0000 mg | DELAYED_RELEASE_TABLET | Freq: Every day | ORAL | Status: DC
  Administered 2013-10-21: 10:00:00 81 mg via ORAL

## 2013-10-21 MED ORDER — NITROGLYCERIN 0.4 MG SL SUBL: 0.4000 mg | SUBLINGUAL_TABLET | SUBLINGUAL | Status: DC | PRN

## 2013-10-21 NOTE — Progress Notes (Signed)
Successful distal LAD stent yesterday and no symptoms overnight or this AM with ambulation. Right radial is unremarkable.Plan home today. Follow-up with Dr. Herbie Baltimore 1-2 weeks.

## 2013-10-21 NOTE — Discharge Summary (Signed)
Physician Discharge Summary       Patient ID: [name] MRN: [id number] DOB/AGE: 1956-03-27 58 y.o.  Admit date: 10/20/2013 Discharge date: 10/21/2013  Discharge Diagnoses:  Principal Problem:   Angina, class II - decreased exercise tolerance, dyspnea & fatigue Active Problems:   CAD S/P percutaneous coronary angioplasty   Abnormal nuclear cardiac imaging test - INTERMEDIATE RISK   Presence of stent in right coronary artery - Promus DES 3.0 mm x 24 mm (post-dilated to 3.6mm)   Presence of drug coated stent in LAD coronary artery   Essential hypertension   Discharged Condition: good  Procedures: cardiac cath and PCI with DES to LAD by Dr. Herbie Baltimore 10/20/13  Hospital Course: 58 year old Male seen in office for evaluation of an abnormal stress test. He has coronary artery disease with inferior myocardial infarction in 2013. He was seen by Dr. Herbie Baltimore in the spring. He was doing well at that time and earlier this month called with some chest discomfort. He was somewhat vague about this but he felt some mild discomfort that he thought was similar possibly to the day he had a heart attack. However, on the 17th of July he dropped in for an EKG and this was not remarkable. He has however since that time felt fatigued. He's had decreased exercise tolerance and has wanted to sleep more. He has not had any classic chest pressure, neck or arm discomfort. He said no new palpitations, presyncope or syncope. He has had no PND or orthopnea. He was sent for a stress perfusion study which demonstrated possible old anterior septal ischemia. This was interpreted as intermediate risk. Pt was scheduled for elective cardiac cath.   FINDINGS:  Hemodynamics:  Central Aortic Pressure / Mean: 110/69/87 mmHg  Left Ventricular Pressure / LVEDP: 110/0/7 mmHg Left Ventriculography:  EF: 55-60 %  Wall Motion: Essentially normal Coronary Anatomy:  Dominance: Right Left Main: Large caliber vessel that  bifurcates into the LAD and Circumflex. Angiographically normal LAD: Large-caliber vessel which reaches down around the apex. It gives rise to a major first diagonal branch and a smaller second diagonal branch. Just beyond D2 in the distal vessel, there is a focal 95-99% subtotal stenosis with TIMI 2 flow distally. The remainder of the vessels relatively free of disease.  D1: Moderate to large caliber vessel with a proximal tubular 50-60% stenosis followed by bifurcation into a larger parent branch and a smaller daughter vessel that has an ostial 60-70% stenosis. The parent vessel is probably a 2.5-2.75 mm vessel where as the daughter is probably 2.0-2.25 mm  D2: Small caliber vessel; angiographic normal. Left Circumflex: Normal, large-caliber nondominant vessel it bifurcates in the OM2 and the AV groove following vessel. Beyond OM2 there is a focal, "napkin ring "40-50% stenosis that was previously noted. Circumflex then branches into OM 2 and the following OM 3/posterior lateral branch.  OM 1: Small-caliber vessel that covers minimal area, but mild luminal irregularities  OM 2: Major obtuse marginal branch somewhat tortuous with minimal luminal irregular reaches down toward the apex.  OM 3: This is the second real obtuse marginal branch that has minimal luminal irregularities  Left Posterior Lateral Branch: The second branch of the AV groove circumflex that has minimal luminal irregularities RCA: Large-caliber, dominant vessel with mid vessel tubular 20-40% stenosis followed by widely patent graft beyond the Crux. The vessel then continues on distally to bifurcate into the Right Posterior Descending Artery (RPDA) and the Right Posterior AV Groove Branch (RPAV). No significant  disease after the stent. RPDA: This a large branching vessel with minimal luminal irregularities RPL Sysytem:The RPAV is a relatively small, diminutive vessel with several small branches. The AV nodal branch does arise from this  vessel. After reviewing the initial angiography, the culprit lesion was thought to be the distal LAD 95-99% stenosis. Preparation were made to proceed with PCI on this lesion.  In addition to the LAD lesion there is a moderate lesion in the mid AV Groove Circumflex as well as the proximal portion of the D1. To investigate these 2 lesions the plan was to proceed initially with FFR measurement the first the circumflex followed by the diagonal. The smaller daughter branch of D1 appears out ostial lesion of roughly 60-70%, however this is perhaps a 2.0-2.25 mm vessel and the left significant of the 2 branches. The decision was to investigate the larger parent vessel for significance, if this did show significant than bifurcation PCI/PTCA would be performed, if not, then the plan will be for medical therapy of the smaller branch vessel. There was lateral ischemia on the stress test which could be easily explained by this small daughter vessel there is not an optimal PCI candidate for long without intervening on the larger branch.  Fractional Flow Reserve Measurement (FFR)  Lesion #1 - Midcircumflex prior to OM 2 bifurcation: 60% lesion  6 [place] XB LAD 3.5 guide catheter; [place]cano Primewire was advanced into the distal AV circumflex  Adenosine running 140 mcg/min for 2 minutes -- >  Initial and final FFR 1.0; not physiologically significant Lesion #2 - proximal D1 into larger parent branch 6 [place] XB LAD 3.5 guide catheter; [place] Primewire was advanced into the distal AV circumflex  Adenosine running 140 mcg/min for 2 minutes -- >  Initial FFR 1.0; final FFR of 0.86 not physiologically significant  With a nonsignificant stenosis in the proximal diagonal prior to the bifurcation, the decision was made to opt for medical therapy to treat the more significantly stenosed smaller daughter branch. Percutaneous Coronary Intervention:  Guide: 6 Fr XB LAD Guidewire: Pro-water  Predilation Balloon: Euphora 2.0 mm  x 12 mm;  8 Atm x 20 Sec, Stent: Biotronik AG DES 2.5 mm x 13 mm;  14 Atm x 30 Sec,  Post-dilation Balloon: Peetz Euphora 2.75 mm x 8 mm;  16 Atm x 45 Sec,  Final Diameter: 2.75 mm Post deployment angiography in multiple views, with and without guidewire in place revealed excellent stent deployment and lesion coverage. There was no evidence of dissection or perforation.   POST-OPERATIVE DIAGNOSIS:  Is severe distal LAD 95-99% stenosis with moderate disease of the first diagonal branch and less significant disease in the mid circumflex.  FFR measurement of the first diagonal larger parent branch was not physiologically significant. Angiographically the smaller branch is clearly the culprit for the anterolateral ischemia, but by itself is not a good PCI target.  Widely patent RCA stent  Well-preserved LVEF with normal LVEDP   The next AM pt without complaints, ambulated with cardiac rehab. No complaints, pt seen and found stable for discharge by Dr. Tamala Julian.  He will follow up in Dr. Allison Quarry office in 1-2 weeks.        Consults: None  Significant Diagnostic Studies:  BMET    Component Value Date/Time   NA 140 10/21/2013 0250   K 4.1 10/21/2013 0250   CL 100 10/21/2013 0250   CO2 26 10/21/2013 0250   GLUCOSE 92 10/21/2013 0250   BUN 19 10/21/2013 0250  CREATININE 0.80 10/21/2013 0250   CREATININE 0.89 10/16/2013 1455   CALCIUM 9.2 10/21/2013 0250   GFRNONAA >90 10/21/2013 0250   GFRNONAA >89 10/16/2013 1455   GFRAA >90 10/21/2013 0250   GFRAA >89 10/16/2013 1455    CBC    Component Value Date/Time   WBC 6.9 10/21/2013 0250   RBC 4.55 10/21/2013 0250   HGB 14.4 10/21/2013 0250   HCT 40.4 10/21/2013 0250   PLT 141* 10/21/2013 0250   MCV 88.8 10/21/2013 0250   MCH 31.6 10/21/2013 0250   MCHC 35.6 10/21/2013 0250   RDW 13.4 10/21/2013 0250     CK 91, MB 5.1  EKG: Sinus bradycardia with 1st degree A-V block Otherwise normal ECG  Discharge Exam: Blood pressure 141/87, pulse 54,  temperature 98.5 F (36.9 C), temperature source Oral, resp. rate 20, height $RemoveBe'5\' 10"'KEFpDNWgR$  (1.778 m), weight 196 lb 10.4 oz (89.2 kg), SpO2 95.00%.    Disposition: 01-Home or Self Care     Medication List         aspirin 81 MG tablet  Take 81 mg by mouth daily.     atorvastatin 20 MG tablet  Commonly known as:  LIPITOR  Take 1 tablet (20 mg total) by mouth daily.     clonazePAM 1 MG tablet  Commonly known as:  KLONOPIN  Take 1 mg by mouth 2 (two) times daily as needed for anxiety. Sleep/anixety     clopidogrel 75 MG tablet  Commonly known as:  PLAVIX  Take 75 mg by mouth daily.     finasteride 5 MG tablet  Commonly known as:  PROSCAR  Take 5 mg by mouth daily.     fluticasone 50 MCG/ACT nasal spray  Commonly known as:  FLONASE  Place 1 spray into the nose daily as needed. For allergies     lisinopril 5 MG tablet  Commonly known as:  PRINIVIL,ZESTRIL  Take 1 tablet (5 mg total) by mouth daily.     nitroGLYCERIN 0.4 MG SL tablet  Commonly known as:  NITROSTAT  Place 1 tablet (0.4 mg total) under the tongue every 5 (five) minutes as needed for chest pain.     PARoxetine 10 MG tablet  Commonly known as:  PAXIL  Take 15 mg by mouth daily.       Follow-up Information   Follow up with Leonie Man, MD On 10/28/2013. (at 2:00pm with Ellen Henri, PA-c that works with Dr. Ellyn Hack)    Specialty:  Cardiology   Contact information:   [address] [place] [address] [phone]        Discharge Instructions: Call [facility] at [phone] if any bleeding, swelling or drainage at cath site.  May shower, no tub baths for 48 hours for groin sticks.   No lifting over 5 pounds for 3 days, no driving for 3 days.   Heart Healthy diet.  Signed: Isaiah Serge Nurse Practitioner-Certified [facility]: [facility] 10/21/2013, 8:46 AM  Time spent on discharge :  >30 minutes.

## 2013-10-21 NOTE — Progress Notes (Signed)
[id number] Cardiac Rehab Pt has been up ambulating in hall, denies any SOB or pain. Completed stent dischagee education with pt and wife. They voice understanding. Pt declines Outpt. CRP, he completed it in 2013 and declines again. He states that he knows that he needs to work harder on eating better and exercise. He plans to use his exercise equipment that he has at home. Beatrix Fetters, RN 10/21/2013 9:32 AM

## 2013-10-21 NOTE — Discharge Summary (Signed)
The patient's clinical data was reviewed, he was interviewed, and examined. The discharge summary as outlined as correct. Discharge instructions were discussed with both patient and the wife. The patient was able ambulate without chest discomfort and has no local cath site complication. He is deemed ready for discharge and will followup as an outpatient with Dr. Herbie Baltimore.

## 2013-10-21 NOTE — Progress Notes (Signed)
S/p stent to LAD after angina, + nuc study, and recurrent chest pain.  Subjective: No complaints, no chest pain.  Objective: Vital signs in last 24 hours: Temp:  [98.4 F (36.9 C)-98.7 F (37.1 C)] 98.5 F (36.9 C) (07/22 0549) Pulse Rate:  [54-63] 54 (07/22 0549) Resp:  [18-20] 20 (07/22 0549) BP: (128-162)/(75-113) 141/87 mmHg (07/22 0549) SpO2:  [95 %-97 %] 95 % (07/22 0549) Weight:  [196 lb 10.4 oz (89.2 kg)-200 lb (90.719 kg)] 196 lb 10.4 oz (89.2 kg) (07/22 0028) Weight change:  Last BM Date: 10/19/13 Intake/Output from previous day: -857 07/21 0701 - 07/22 0700 In: 842.8 [P.O.:480; I.V.:362.8] Out: 1700 [Urine:1700] Intake/Output this shift: Total I/O In: 90.7 [I.V.:90.7] Out: 1300 [Urine:1300]  PE: General:Pleasant affect, NAD Skin:Warm and dry, brisk capillary refill HEENT:normocephalic, sclera clear, mucus membranes moist Heart:S1S2 RRR without murmur, gallup, rub or click Lungs:clear without rales, rhonchi, or wheezes FUX:NATF, non tender, + BS, do not palpate liver spleen or masses Ext:no lower ext edema, 2+ pedal pulses, 2+ radial pulses, rt radial cath site without hematoma 2+ pulses Neuro:alert and oriented X3, MAE, follows commands, + facial symmetry  EKG SB 1st degree AV block  Lab Results:  Recent Labs  10/21/13 0250  WBC 6.9  HGB 14.4  HCT 40.4  PLT 141*   BMET  Recent Labs  10/21/13 0250  NA 140  K 4.1  CL 100  CO2 26  GLUCOSE 92  BUN 19  CREATININE 0.80  CALCIUM 9.2   No results found for this basename: TROPONINI, CK, MB,  in the last 72 hours  Lab Results  Component Value Date   CHOL 181 06/29/2011   HDL 31* 06/29/2011   LDLCALC 98 06/29/2011   TRIG 260* 06/29/2011   CHOLHDL 5.8 06/29/2011   Lab Results  Component Value Date   HGBA1C 5.5 06/28/2011     Lab Results  Component Value Date   TSH 0.841 10/16/2013     Studies/Results: CATH: Is severe distal LAD 95-99% stenosis with moderate disease of the first  diagonal branch and less significant disease in the mid circumflex. PCI DES to LAD FFR measurement of the first diagonal larger parent branch was not physiologically significant. Angiographically the smaller branch is clearly the culprit for the anterolateral ischemia, but by itself is not a good PCI target.  Widely patent RCA stent  Well-preserved LVEF with normal LVEDP  We'll continue medical therapy of the smaller branch of the diagonal, however if the patient does show signs of not having adequate symptom control with medical therapy, would consider then the bifurcation PCI of the D1 parent and daughter branch   Medications: I have reviewed the patient's current medications. Scheduled Meds: . aspirin  81 mg Oral Daily  . atorvastatin  20 mg Oral Daily  . clopidogrel  75 mg Oral Daily  . finasteride  5 mg Oral Daily  . lisinopril  5 mg Oral Daily   Continuous Infusions:  PRN Meds:.acetaminophen, clonazePAM, morphine injection, ondansetron (ZOFRAN) IV  Assessment/Plan: Principal Problem:   Angina, class II - decreased exercise tolerance, dyspnea & fatigue Active Problems:   Essential hypertension   Presence of stent in right coronary artery - Promus DES 3.0 mm x 24 mm (post-dilated to 3.89mm)   CAD S/P percutaneous coronary angioplasty   Abnormal nuclear cardiac imaging test - INTERMEDIATE RISK   Presence of drug coated stent in LAD coronary artery   Abnormal nuclear stress test  LOS: 1 day   Time spent with pt. :15 minutes. [facility]  Nurse Practitioner Certified Pager [phone] or after 5pm and on weekends call [phone] 10/21/2013, 6:32 AM

## 2013-10-21 NOTE — Research (Signed)
BIOFLOW Informed Consent   Subject Name: [name]  Subject met inclusion and exclusion criteria.  The informed consent form, study requirements and expectations were reviewed with the subject and questions and concerns were addressed prior to the signing of the consent form.  The subject verbalized understanding of the trail requirements.  The subject agreed to participate in the BIOFLOW trial and signed the informed consent.  The informed consent was obtained prior to performance of any protocol-specific procedures for the subject.  A copy of the signed informed consent was given to the subject and a copy was placed in the subject's medical record.  Shawn Lord 10/20/2013, 13:20PM  

## 2013-10-21 NOTE — Discharge Instructions (Signed)
Call [facility] at [phone] if any bleeding, swelling or drainage at cath site.  May shower, no tub baths for 48 hours for groin sticks.   No lifting over 5 pounds for 3 days, no driving for 3 days.   Heart Healthy diet.

## 2013-10-28 ENCOUNTER — Ambulatory Visit (INDEPENDENT_AMBULATORY_CARE_PROVIDER_SITE_OTHER): Payer: Federal, State, Local not specified - PPO | Admitting: Cardiology

## 2013-10-28 ENCOUNTER — Encounter: Payer: Self-pay | Admitting: Cardiology

## 2013-10-28 VITALS — BP 112/88 | HR 76 | Ht 70.0 in | Wt 199.1 lb

## 2013-10-28 DIAGNOSIS — I2510 Atherosclerotic heart disease of native coronary artery without angina pectoris: Secondary | ICD-10-CM

## 2013-10-28 NOTE — Patient Instructions (Addendum)
Your physician recommends that you schedule a follow-up appointment in: 6-8 weeks with Dr Herbie Baltimore  No change in medication theraphy

## 2013-10-28 NOTE — Progress Notes (Signed)
Patient ID: [name], male   DOB: 11/02/1955, 58 y.o.   MRN: [id number]    10/28/2013 [name]   02-Apr-1956  [id number]  Primary Physicia Darnelle Bos, MD Primary Cardiologist: Dr. Herbie Baltimore  The patient presents to clinic today for post hospital followup. Details regarding his history and recent hospital course are outlined in detail below.  HPI:  The patient is a 58 year old white male, followed by Dr. Herbie Baltimore. He is status post inferior STEMI, treated with PCI to the RCA in March of 2013. He had been doing fairly well, up until a few weeks ago when he presented to clinic with recurrence of his anginal equivalent of fatigue and reduced exercise tolerance. He underwent nuclear stress testing which revealed septal and anterior lateral ischemia. As was interpreted as an "Intermediate Risk" study. Subsequently, he was referred for invasive evaluation with cardiac catheterization with possible PCI. This was performed by Dr. Herbie Baltimore on 10/20/2013. He was found to have a focal 95-99% subtotal stenosis of the LAD, just beyond the D2 and the distal vessel. This was successfully treated with PCI utilizing a drug-eluting stent. In addition to the LAD lesion, there was a moderate lesion in the mid AV Groove Circumflex as well as the proximal portion of the D1. FFR measurement of the first diagonal larger parent branch was not physiologically significant. Angiographically the smaller branch is clearly the culprit for the anterolateral ischemia, but by itself is not a good PCI target. His previously placed RCA stent was widely patent. He had well-preserved LVEF with normal LVEDP. EF was estimated at 55-60%. He left the Cath Lab in stable condition and was continued on dual antiplatelet therapy with aspirin and Plavix. Previous P2Y12 testing revealed that he is a Plavix responder. He was also continued on a statin and ACE inhibitor. He had been on beta blocker therapy in the past however this was  discontinued due to bradycardia.  He presents to clinic today for post hospital followup. He is accompanied by his wife. He notes significant improvement in symptoms. He no longer feels tired and fatigued and has regained his energy levels back. 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 He has had to lift packages without exertional chest discomfort and no dyspnea. He has also returned to golf for recreation. He denies further exercise intolerance and no chest discomfort with this activity. He also denies dyspnea, dizziness, syncope/near-syncope. He has been fully compliant with his medications. He has not required use of sublingual nitroglycerin.    Current Outpatient Prescriptions  Medication Sig Dispense Refill  . aspirin EC 81 MG tablet Take 81 mg by mouth daily.      Marland Kitchen atorvastatin (LIPITOR) 20 MG tablet Take 1 tablet (20 mg total) by mouth daily.  90 tablet  3  . clonazePAM (KLONOPIN) 0.5 MG tablet Take 0.5 mg by mouth 2 (two) times daily as needed for anxiety.      . clonazePAM (KLONOPIN) 1 MG tablet Take 1 mg by mouth 2 (two) times daily as needed for anxiety. Sleep/anixety      . clopidogrel (PLAVIX) 75 MG tablet Take 75 mg by mouth daily.      . finasteride (PROSCAR) 5 MG tablet Take 5 mg by mouth daily.      . fluticasone (FLONASE) 50 MCG/ACT nasal spray Place 1 spray into the nose daily as needed. For allergies      . lisinopril (PRINIVIL,ZESTRIL) 5 MG tablet  Take 1 tablet (5 mg total) by mouth daily.  90 tablet  3  . nitroGLYCERIN (NITROSTAT) 0.4 MG SL tablet Place 1 tablet (0.4 mg total) under the tongue every 5 (five) minutes as needed for chest pain.  25 tablet  4  . PARoxetine (PAXIL) 10 MG tablet Take 15 mg by mouth daily.        No current facility-administered medications for this visit.    No Known Allergies  History   Social History  . Marital Status: Married    Spouse Name: N/A    Number of Children: 2  . 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 Smoking status: Never Smoker   . Smokeless tobacco: Never Used  . Alcohol Use: No  . Drug Use: No  . Sexual Activity: Not Currently   Other Topics Concern  . Not on file   Social History Narrative   Married father of 2. Never smoked. Does not drink alcohol significantly.   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   He does play golf on a fairly regular basis, but is currently not walking between holes, but riding. He is starting to play with a new partner who likes to walk, so he is hoping to get more walking.     Review of Systems: General: negative for chills, fever, night sweats or weight changes.  Cardiovascular: negative for chest pain, dyspnea on exertion, edema, orthopnea, palpitations, paroxysmal nocturnal dyspnea or shortness of breath Dermatological: negative for rash Respiratory: negative for cough or wheezing Urologic: negative for hematuria Abdominal: negative for nausea, vomiting, diarrhea, bright red blood per rectum, melena, or hematemesis Neurologic: negative for visual changes, syncope, or dizziness All other systems reviewed and are otherwise negative except as noted above.    Blood pressure 112/88, pulse 76, height 5\' 10"  (1.778 m), weight 199 lb 1.6 oz (90.311 kg).  General appearance: alert, cooperative and no distress Neck: no carotid bruit and no JVD Lungs: clear to auscultation bilaterally Heart: regular rate and rhythm, S1, S2 normal, no murmur, click, rub or gallop Extremities: no LEE Pulses: 2+ and symmetric Skin: warm and dry Neurologic: Grossly normal  EKG normal sinus rhythm with first degree AV block. Heart rate 76 beats per minute.  ASSESSMENT AND PLAN:   1. CAD: Known CAD with prior STEMI in 2013 resulting in PCI plus stenting of the RCA. Also less  than 2 weeks status post PCI plus stenting of his LAD. Previously placed RCA stent was noted to be widely patent at time of cath. His normal systolic function. Symptoms have improved, with notable improvement in exercise tolerance and decreased fatigue. No chest discomfort or dyspnea. Continue dual antiplatelet therapy with aspirin plus Plavix as well as statin and ACE inhibitor.  2. Hypertension: Well controlled. Continue ACE inhibitor.  3. Hyperlipidemia: Most recent lipid panel revealed an LDL of 71 mg/dL. Continue statin therapy with Lipitor.   PLAN  Patient appears to be doing well post discharge, after undergoing recent PCI. His symptoms have resolved. Heart rate and blood pressure are well controlled and he is on the appropriate medications. He has been instructed to followup with his primary cardiologist, Dr. 2014, in 6 weeks for reassessment or sooner if symptoms recur.  Herbie Baltimore 10/28/2013 3:33 PM

## 2013-11-11 ENCOUNTER — Telehealth: Payer: Self-pay

## 2013-11-11 NOTE — Telephone Encounter (Signed)
Returned call to patient he walked in office earlier and was unable to wait for triage nurse,he had to return to work.Patient stated he was unable to take lisinopril 5 mg.Stated made him feel tired,could hardly go.Stated he decreased back to 2.5 mg daily and he feels much better.B/P 145/85.Stated he wanted to make sure Dr.Harding was ok with the decrease.Dr.Harding out of office will send message to him for advice.

## 2013-11-16 ENCOUNTER — Telehealth: Payer: Self-pay | Admitting: Cardiology

## 2013-11-16 NOTE — Telephone Encounter (Signed)
Mrs [name] brought completed ROI ,Payment and FMLA papers into office.  Paperwork was entered into [employer] and forwarded to [employer] for processing.

## 2013-11-16 NOTE — Telephone Encounter (Signed)
That is fine - I want him feeling better.  2.5 mg is acceptable.  Marykay Lex, MD

## 2013-11-16 NOTE — Telephone Encounter (Signed)
Sounds Good.  [name]

## 2013-11-18 ENCOUNTER — Telehealth: Payer: Self-pay | Admitting: Cardiology

## 2013-11-18 NOTE — Telephone Encounter (Signed)
I'm not sure what this means. He is fine taking 2.5 mg of lisinopril.  Marykay Lex, MD

## 2013-11-18 NOTE — Addendum Note (Signed)
Addended by: Tobin Chad on: 11/18/2013 12:26 PM   Modules accepted: Orders

## 2013-11-18 NOTE — Telephone Encounter (Signed)
INFORMED PATIENT THAT IT IS OKAY TO DECREASE TO 2.5 MG  PER PATIENT , HE STATES THAT HE GAVE THE WRONG INFORMATION ON EARLIER CONVERSATION PATIENT STATES  HE IS TAKING lisinopril 5 mg . He states wife handles his medication- he's taking 1/2 tablet of paxil.

## 2013-11-18 NOTE — Telephone Encounter (Signed)
Follow Up    Pt calling following up on call from earlier. States he is unaware of the nature of call. Please call back.

## 2013-11-18 NOTE — Telephone Encounter (Signed)
LEFT MESSAGE ON ANSWER MACHINE . OKAY TO CONTINUE WITH 2.5 MG  LISINOPRIL ANY QUESTION MAY CALL BACK.

## 2013-11-24 ENCOUNTER — Telehealth: Payer: Self-pay | Admitting: Cardiology

## 2013-11-24 NOTE — Telephone Encounter (Signed)
8/24 Received FMLA form from [employer] @ [employer].  8.25.15 Gave FMLA paperwork to Ermalene Searing RN for Dr Herbie Baltimore to sign.

## 2013-11-27 NOTE — Telephone Encounter (Signed)
FMLA papers signed and completed.  Notified patient that ready for pick up

## 2013-12-14 ENCOUNTER — Ambulatory Visit: Payer: Federal, State, Local not specified - PPO | Admitting: Cardiology

## 2013-12-19 ENCOUNTER — Encounter: Payer: Self-pay | Admitting: Cardiology

## 2014-01-14 ENCOUNTER — Telehealth: Payer: Self-pay | Admitting: Cardiology

## 2014-01-15 NOTE — Telephone Encounter (Signed)
Close encounter 

## 2014-01-21 ENCOUNTER — Ambulatory Visit: Payer: Federal, State, Local not specified - PPO | Admitting: Cardiology

## 2014-02-06 ENCOUNTER — Other Ambulatory Visit: Payer: Self-pay | Admitting: Cardiology

## 2014-02-08 NOTE — Telephone Encounter (Signed)
E sent to pharmacy 

## 2014-03-01 ENCOUNTER — Ambulatory Visit: Payer: Federal, State, Local not specified - PPO | Admitting: Cardiology

## 2014-03-11 ENCOUNTER — Encounter (HOSPITAL_COMMUNITY): Payer: Self-pay | Admitting: Cardiology

## 2014-04-15 ENCOUNTER — Encounter: Payer: Self-pay | Admitting: Cardiology

## 2014-04-15 ENCOUNTER — Ambulatory Visit (INDEPENDENT_AMBULATORY_CARE_PROVIDER_SITE_OTHER): Payer: Federal, State, Local not specified - PPO | Admitting: Cardiology

## 2014-04-15 VITALS — BP 136/94 | HR 65 | Ht 70.0 in | Wt 196.6 lb

## 2014-04-15 DIAGNOSIS — Z9861 Coronary angioplasty status: Secondary | ICD-10-CM

## 2014-04-15 DIAGNOSIS — F419 Anxiety disorder, unspecified: Secondary | ICD-10-CM

## 2014-04-15 DIAGNOSIS — I10 Essential (primary) hypertension: Secondary | ICD-10-CM

## 2014-04-15 DIAGNOSIS — I208 Other forms of angina pectoris: Secondary | ICD-10-CM

## 2014-04-15 DIAGNOSIS — Z955 Presence of coronary angioplasty implant and graft: Secondary | ICD-10-CM

## 2014-04-15 DIAGNOSIS — R931 Abnormal findings on diagnostic imaging of heart and coronary circulation: Secondary | ICD-10-CM

## 2014-04-15 DIAGNOSIS — I2510 Atherosclerotic heart disease of native coronary artery without angina pectoris: Secondary | ICD-10-CM

## 2014-04-15 DIAGNOSIS — I2119 ST elevation (STEMI) myocardial infarction involving other coronary artery of inferior wall: Secondary | ICD-10-CM

## 2014-04-15 DIAGNOSIS — I209 Angina pectoris, unspecified: Secondary | ICD-10-CM

## 2014-04-15 DIAGNOSIS — E785 Hyperlipidemia, unspecified: Secondary | ICD-10-CM

## 2014-04-15 NOTE — Assessment & Plan Note (Addendum)
Status post PCI to both LAD and RCA. 6 months post RCA stent remains on aspirin plus Plavix with no bleeding issues. Is on statin and ACE inhibitor. Not on beta blocker for fear of exacerbating his fatigue and depression. See above. Continue DAPT for now - ASA/Plavix. Okay to potentially stop aspirin in July,continue Plavix indefinitely.

## 2014-04-15 NOTE — Assessment & Plan Note (Signed)
Has been well controlled. On statin. Monitored by PCP.

## 2014-04-15 NOTE — Assessment & Plan Note (Signed)
I do think that is a good idea for him to increase his Paxil dose over the holidays. He also is on when necessary Klonopin.

## 2014-04-15 NOTE — Assessment & Plan Note (Signed)
No significant lesion was treated with PCI. There could be some lateral ischemia from the small diagonal branch. Unless he has worsening symptoms I would discontinue to treat medically as this small branch was relatively small in diameter.

## 2014-04-15 NOTE — Patient Instructions (Addendum)
Your physician recommends that you schedule a follow-up appointment in July with Dr.Harding.   Your physician discussed the importance of regular exercise and recommended that you start or continue a regular exercise program for good health.

## 2014-04-15 NOTE — Assessment & Plan Note (Signed)
Essentially resolved following PCI. Seems to be doing okay with the moderate disease in the diagonal branch being treated medically.

## 2014-04-15 NOTE — Progress Notes (Signed)
PATIENT: [name] MRN: [id number]  DOB: Nov 02, 1955   DOV:04/15/2014 PCP: Kristie Cowman, MD  Clinic Note: Chief Complaint  Patient presents with  . Follow-up    6 mo. no complaints  . 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 He actually had a hospital admission for what was probably crescendo class III angina back in July 2015. He is found to have an abnormal Myoview. A cardiac catheter is a she had severe mid LAD disease treated with DES stent that was a new bioabsorbable polymer study stent. He also had lesions in the first diagonal and mid circumflex that were evaluated with FFR.  He was continued on Plavix He was actually seen on July 29 for post hospital-well, but his visits with me were progressively canceled for various reasons. At his initial follow-up visit he was not noticing any further symptoms. Less fatigue. He returned to work.    Interval History:  He presents today really with no major complaint. His wife however indicates that he still doesn't have the motivation to get up and go and exercise. He seems short of breath with doing moderate exertion. He denies this, but admits that he just sometimes feels tired in the work. Work is become much more stressful, especially over the holidays. He actually increased his Paxil dose to 15 mg for increased stress. That actually helped. He has noticed that work is more physically taxing than it used to be.he still likes to take a nap after he comes home from work and she is concerned about that. He denies any resting or exertional chest discomfort or significant dyspnea. Nothing suggestive of his previous angina. No dyspnea with rest or exertion. No heart failure symptoms of PND, orthopnea or edema. No palpitations, lightheadedness, dizziness, weakness, syncope/near syncope, or TIA/amaurosis fugax symptoms.  Past Medical History  Diagnosis  Date  . ST elevation myocardial infarction (STEMI) involving right coronary artery in recovery phase 06/2011    100% occluded RCA; PCI - 3.0x24 Promus DES (3.5-3.6 mm) ; Modwith moderate LAD & Cx disease, EF 55-60% w/ basal inferoseptal HK confirmed by echo  . CAD S/P percutaneous coronary angioplasty 06/2011, 09/2013    (Therapeutic P2Y12 on Plavix) STEMI -mid RCA DES PCI; 2015: Class III angina (abnormal stress test with anterior lateral ischemia -> mid-distal LAD 95-99% (PCI: Biotronik AG 2 DES 2.5 x 13, 2.75 mm), D1 proximal 50-60% (FFR 0.6) with 60-70% in small branch (med management), mid circumflex 50-60% (FFR 1.0)   . Abnormal nuclear stress test 10/21/2013    Intermediate Risk - septal, anterolateral ischemia --> cath with LAD, diagonal and circumflex disease.   Marland Kitchen Dyslipidemia, goal LDL below 70   . Essential hypertension   . Anxiety   . BPH (benign prostatic hyperplasia)   . GERD (gastroesophageal reflux disease)   . Arthritis     "mostly in my hands; sometimes other areas" (10/20/2013)  . RA (rheumatoid arthritis)   . Depression   . Bipolar disorder   . Rheumatic fever 1969  . Dizziness of unknown cause January 2011    abn MRI, Neuro w/u Jan 2011   No Known Allergies  Current Outpatient Prescriptions  Medication Sig Dispense Refill  . aspirin EC 81 MG tablet Take 81 mg by mouth daily.    Marland Kitchen atorvastatin (LIPITOR) 20 MG tablet Take 1 tablet (20 mg total) by mouth daily.  90 tablet 3  . clonazePAM (KLONOPIN) 1 MG tablet Take 1 mg by mouth 2 (two) times daily as needed for anxiety. Sleep/anixety    . clopidogrel (PLAVIX) 75 MG tablet Take 75 mg by mouth daily.    . finasteride (PROSCAR) 5 MG tablet Take 5 mg by mouth daily.    . fluticasone (FLONASE) 50 MCG/ACT nasal spray Place 1 spray into the nose daily as needed. For allergies    . lisinopril (PRINIVIL,ZESTRIL) 5 MG tablet TAKE 1 TABLET BY MOUTH DAILY. 30 tablet 6  . nitroGLYCERIN (NITROSTAT) 0.4 MG SL tablet Place 1 tablet (0.4  mg total) under the tongue every 5 (five) minutes as needed for chest pain. 25 tablet 4  . PARoxetine (PAXIL) 10 MG tablet Take 15 mg by mouth daily.      No current facility-administered medications for this visit.   History   Social History Narrative   Married father of 2. Never smoked. Does not drink alcohol significantly.   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   He does play golf on a fairly regular basis, but is currently not walking between holes, but riding. He is starting to play with a new partner who likes to walk, so he is hoping to get more walking.   ROS: A comprehensive Review of Systems - was performed Review of Systems  Constitutional: Malaise/fatigue: not really fatigued. Just tired at the end of work.  HENT: Negative for nosebleeds.   Respiratory: Positive for shortness of breath (if he really pushes himself). Negative for cough and wheezing.   Cardiovascular: Negative for claudication.  Gastrointestinal: Positive for constipation. Negative for blood in stool and melena.  Genitourinary: Negative for hematuria and flank pain.  Musculoskeletal: Positive for joint pain. Negative for falls.  Neurological: Negative for dizziness.  Endo/Heme/Allergies: Does not bruise/bleed easily (much better with Plavix then Brilinta).  Psychiatric/Behavioral: Positive for depression (Lack of motivation; increased stress -- no manic episodes). Negative for memory loss. The patient is not nervous/anxious and does not have insomnia.   All other systems reviewed and are negative.    PHYSICAL EXAM BP 136/94 mmHg  Pulse 65  Ht 5\' 10"  (1.778 m)  Wt 196 lb 9.6 oz (89.177 kg)  BMI 28.21 kg/m2 General appearance: alert, cooperative, appears stated age, no distress and Healthy-appearing, well-nourished/well-groomed. Present mood and affect Neck: no adenopathy, no carotid bruit, no JVD and  supple, symmetrical, trachea midline Lungs: CTA B, normal percussion bilaterally and Nonlabored, good air movement Heart: RRR, S1, S2 normal, no murmur, click, rub or gallop and normal apical impulse Abdomen: soft, non-tender; bowel sounds normal; no masses,  no organomegaly Extremities: extremities normal, atraumatic, no cyanosis or edema, no edema, redness or tenderness in the calves or thighs and no ulcers, gangrene or trophic changes Pulses: 2+ and symmetric Neurologic: A&OX 3, normal strength and tone. Normal symmetric reflexes. Normal coordination and gait   Adult ECG Report  Rate: 62 ;  Rhythm: normal sinus rhythm and With first degree AV block  Voltages: High in lead 1 (borderline LVH close;  Conduction Disturbances: first-degree A-V block   Narrative Interpretation: Essentially normal EKG. No significant change  Recent Labs: No recent labs. Monitored by PCP.  ASSESSMENT / PLAN: CAD S/P percutaneous coronary angioplasty; therapeutic P2Y12 for Plavix Status post PCI to both LAD and RCA. 6 months post RCA stent remains on aspirin  plus Plavix with no bleeding issues. Is on statin and ACE inhibitor. Not on beta blocker for fear of exacerbating his fatigue and depression. See above. Continue DAPT for now - ASA/Plavix. Okay to potentially stop aspirin in July,continue Plavix indefinitely.   Angina, class II - decreased exercise tolerance, dyspnea & fatigue Essentially resolved following PCI. Seems to be doing okay with the moderate disease in the diagonal branch being treated medically.   Presence of drug coated stent in LAD coronary artery: Biotronik AG DES 2.5 mm x 13 mm (2.75 mm) See above. Continue DAPT for now - ASA/Plavix. Okay to potentially stop aspirin in July,continue Plavix indefinitely.   Presence of DESin RCA - Promus DES 3.0 mm x 24 mm (post-dilated to 3.60mm) See above.    Abnormal nuclear cardiac imaging test - INTERMEDIATE RISK No significant lesion was treated  with PCI. There could be some lateral ischemia from the small diagonal branch. Unless he has worsening symptoms I would discontinue to treat medically as this small branch was relatively small in diameter.   Dyslipidemia, goal LDL below 70 Has been well controlled. On statin. Monitored by PCP.   Essential hypertension Borderline control today. Seems to be doing okay with 5 mg of ACE inhibitor. Again not using beta blocker for fear of exacerbating his fatigue and depression.   Anxiety I do think that is a good idea for him to increase his Paxil dose over the holidays. He also is on when necessary Klonopin.   History of ST elevation myocardial infarction (STEMI) of inferior wall No adverse effects as far as heart failure goes from his MI. But I think he is ill having some of the anxiety effect of the MI that was returned when he came in with angina and abnormal stress test. He's having a hard time convincing himself safe to get back into his exercise program. I spent quite a bit time counseling him on how this is a reasonable response to having CAD, but he that he needs to get past that and move forward. If it means he needs more Paxil that's fine. He needs to continue to exercise.   total time with patient was about 45 minutes. Over half the time was spent counseling on the psychiatric component of post MI care.  Orders Placed This Encounter  Procedures  . EKG 12-Lead    Standing Status: Standing     Number of Occurrences: 1     Standing Expiration Date:     Order Specific Question:  Reason for Exam    Answer:  CAD   Followup: 6 months  DAVID W. Herbie Baltimore, M.D., M.S. Interventional Cardiology [facility]

## 2014-04-15 NOTE — Assessment & Plan Note (Signed)
See above. Continue DAPT for now - ASA/Plavix. Okay to potentially stop aspirin in July,continue Plavix indefinitely. 

## 2014-04-15 NOTE — Assessment & Plan Note (Signed)
No adverse effects as far as heart failure goes from his MI. But I think he is ill having some of the anxiety effect of the MI that was returned when he came in with angina and abnormal stress test. He's having a hard time convincing himself safe to get back into his exercise program. I spent quite a bit time counseling him on how this is a reasonable response to having CAD, but he that he needs to get past that and move forward. If it means he needs more Paxil that's fine. He needs to continue to exercise.

## 2014-04-15 NOTE — Assessment & Plan Note (Signed)
See above

## 2014-04-15 NOTE — Assessment & Plan Note (Deleted)
See above. Continue DAPT for now - ASA/Plavix. Okay to potentially stop aspirin in July,continue Plavix indefinitely.

## 2014-04-15 NOTE — Assessment & Plan Note (Signed)
Borderline control today. Seems to be doing okay with 5 mg of ACE inhibitor. Again not using beta blocker for fear of exacerbating his fatigue and depression.

## 2014-05-11 ENCOUNTER — Telehealth: Payer: Self-pay | Admitting: *Deleted

## 2014-05-11 DIAGNOSIS — E785 Hyperlipidemia, unspecified: Secondary | ICD-10-CM

## 2014-05-11 DIAGNOSIS — Z79899 Other long term (current) drug therapy: Secondary | ICD-10-CM

## 2014-05-11 NOTE — Telephone Encounter (Signed)
-----   Message from Tobin Chad, RN sent at 07/23/2013  8:29 AM EDT ----- Mail NMR with lipid,cmp feb 2016  To due march 2016

## 2014-05-11 NOTE — Telephone Encounter (Signed)
Mail letter and labslip- nmr ,cmp 

## 2014-07-15 ENCOUNTER — Other Ambulatory Visit: Payer: Self-pay | Admitting: Cardiology

## 2014-07-15 NOTE — Telephone Encounter (Signed)
E sent to pharmacy 

## 2014-08-09 ENCOUNTER — Other Ambulatory Visit: Payer: Self-pay | Admitting: Cardiology

## 2014-08-21 ENCOUNTER — Encounter (HOSPITAL_COMMUNITY): Payer: Self-pay | Admitting: *Deleted

## 2014-08-21 ENCOUNTER — Emergency Department (HOSPITAL_COMMUNITY): Payer: Federal, State, Local not specified - PPO

## 2014-08-21 ENCOUNTER — Emergency Department (HOSPITAL_COMMUNITY)
Admission: EM | Admit: 2014-08-21 | Discharge: 2014-08-21 | Disposition: A | Discharge status: Home / Self Care | Payer: Federal, State, Local not specified - PPO | Attending: Emergency Medicine | Admitting: Emergency Medicine

## 2014-08-21 DIAGNOSIS — Y9389 Activity, other specified: Secondary | ICD-10-CM | POA: Insufficient documentation

## 2014-08-21 DIAGNOSIS — Z9889 Other specified postprocedural states: Secondary | ICD-10-CM | POA: Insufficient documentation

## 2014-08-21 DIAGNOSIS — Z7902 Long term (current) use of antithrombotics/antiplatelets: Secondary | ICD-10-CM | POA: Diagnosis not present

## 2014-08-21 DIAGNOSIS — I2510 Atherosclerotic heart disease of native coronary artery without angina pectoris: Secondary | ICD-10-CM | POA: Insufficient documentation

## 2014-08-21 DIAGNOSIS — W108XXA Fall (on) (from) other stairs and steps, initial encounter: Secondary | ICD-10-CM | POA: Insufficient documentation

## 2014-08-21 DIAGNOSIS — F419 Anxiety disorder, unspecified: Secondary | ICD-10-CM | POA: Diagnosis not present

## 2014-08-21 DIAGNOSIS — S3992XA Unspecified injury of lower back, initial encounter: Secondary | ICD-10-CM

## 2014-08-21 DIAGNOSIS — I252 Old myocardial infarction: Secondary | ICD-10-CM | POA: Insufficient documentation

## 2014-08-21 DIAGNOSIS — I10 Essential (primary) hypertension: Secondary | ICD-10-CM | POA: Diagnosis not present

## 2014-08-21 DIAGNOSIS — Y9289 Other specified places as the place of occurrence of the external cause: Secondary | ICD-10-CM | POA: Diagnosis not present

## 2014-08-21 DIAGNOSIS — Z8719 Personal history of other diseases of the digestive system: Secondary | ICD-10-CM | POA: Diagnosis not present

## 2014-08-21 DIAGNOSIS — Y990 Civilian activity done for income or pay: Secondary | ICD-10-CM | POA: Diagnosis not present

## 2014-08-21 DIAGNOSIS — S0001XA Abrasion of scalp, initial encounter: Secondary | ICD-10-CM | POA: Insufficient documentation

## 2014-08-21 DIAGNOSIS — Z7951 Long term (current) use of inhaled steroids: Secondary | ICD-10-CM | POA: Diagnosis not present

## 2014-08-21 DIAGNOSIS — W19XXXA Unspecified fall, initial encounter: Secondary | ICD-10-CM

## 2014-08-21 DIAGNOSIS — Z7982 Long term (current) use of aspirin: Secondary | ICD-10-CM | POA: Insufficient documentation

## 2014-08-21 DIAGNOSIS — S0990XA Unspecified injury of head, initial encounter: Secondary | ICD-10-CM | POA: Diagnosis present

## 2014-08-21 DIAGNOSIS — Z9861 Coronary angioplasty status: Secondary | ICD-10-CM | POA: Insufficient documentation

## 2014-08-21 DIAGNOSIS — M069 Rheumatoid arthritis, unspecified: Secondary | ICD-10-CM | POA: Diagnosis not present

## 2014-08-21 DIAGNOSIS — N400 Enlarged prostate without lower urinary tract symptoms: Secondary | ICD-10-CM | POA: Diagnosis not present

## 2014-08-21 DIAGNOSIS — E785 Hyperlipidemia, unspecified: Secondary | ICD-10-CM | POA: Insufficient documentation

## 2014-08-21 DIAGNOSIS — Z79899 Other long term (current) drug therapy: Secondary | ICD-10-CM | POA: Diagnosis not present

## 2014-08-21 DIAGNOSIS — F319 Bipolar disorder, unspecified: Secondary | ICD-10-CM | POA: Diagnosis not present

## 2014-08-21 MED ORDER — METHOCARBAMOL 500 MG PO TABS: 500.0000 mg | ORAL_TABLET | Freq: Two times a day (BID) | ORAL | Status: DC

## 2014-08-21 MED ORDER — HYDROCODONE-ACETAMINOPHEN 5-325 MG PO TABS: 2.0000 | ORAL_TABLET | ORAL | Status: DC | PRN

## 2014-08-21 NOTE — ED Notes (Signed)
Pt states that he slipped on a step today. States that he fell flat on his back. Pt reports headache at the back of his head. Pt alert and oriented. Pt reports taking plavix.

## 2014-08-21 NOTE — Discharge Instructions (Signed)
Take Vicodin as needed for pain. Take Robaxin as needed for muscle spasm. You may take these medications together. Refer to attached documents for more information.  °

## 2014-08-21 NOTE — ED Provider Notes (Signed)
CSN: [id number]     Arrival date & time 08/21/14  3888 History   First MD Initiated Contact with Patient 08/21/14 1002     No chief complaint on file.    (Consider location/radiation/quality/duration/timing/severity/associated sxs/prior Treatment) HPI Comments: Patient is a 59 year old male who presents after a mechanical fall at work prior to arrival. 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 He landed on his "tailbone" and hit the back of his head on brick. He denies LOC but reports a headache that is aching and located at the point of impact. His tailbone has aching and severe pain without radiation. No other injury. Movement and palpation makes the pain worse. No alleviating factors. Patient takes Plavix for anticoagulation from a previous MI.    Past Medical History  Diagnosis Date  . ST elevation myocardial infarction (STEMI) involving right coronary artery in recovery phase 06/2011    100% occluded RCA; PCI - 3.0x24 Promus DES (3.5-3.6 mm) ; Modwith moderate LAD & Cx disease, EF 55-60% w/ basal inferoseptal HK confirmed by echo  . CAD S/P percutaneous coronary angioplasty 06/2011, 09/2013    (Therapeutic P2Y12 on Plavix) STEMI -mid RCA DES PCI; 2015: Class III angina (abnormal stress test with anterior lateral ischemia -> mid-distal LAD 95-99% (PCI: Biotronik AG 2 DES 2.5 x 13, 2.75 mm), D1 proximal 50-60% (FFR 0.6) with 60-70% in small branch (med management), mid circumflex 50-60% (FFR 1.0)   . Abnormal nuclear stress test 10/21/2013    Intermediate Risk - septal, anterolateral ischemia --> cath with LAD, diagonal and circumflex disease.   Marland Kitchen Dyslipidemia, goal LDL below 70   . Essential hypertension   . Anxiety   . BPH (benign prostatic hyperplasia)   . GERD (gastroesophageal reflux disease)   . Arthritis     "mostly in my hands; sometimes other areas" (10/20/2013)  . RA (rheumatoid arthritis)   . Depression    . Bipolar disorder   . Rheumatic fever 1969  . Dizziness of unknown cause January 2011    abn MRI, Neuro w/u Jan 2011   Past Surgical History  Procedure Laterality Date  . Shoulder arthroscopy w/ rotator cuff repair Left July 2011  . Percutaneous coronary stent intervention (pci-s)  06/2011; 09/2013    a) 3/'13: 100%  RCA ->PCI 3.20x2 (3.6) Promus DES stent; 7/'15: mLAD PCI Biotronik AG DES 2.5 x13 2.75 mm), FFR D1 50-60% = 0.86 (small branch had ~70%), mCx ~50-60% FFR=1.0.  . Plantar fascia surgery Left 2004    "& neuroma"  . Transthoracic echocardiogram  March 2013    EF 55%-60% with mild basilar inferoseptal hypokinesis also noted on LV-gram  . Left heart catheterization with coronary angiogram N/A 06/28/2011    Procedure: LEFT HEART CATHETERIZATION WITH CORONARY ANGIOGRAM;  Surgeon: Marykay Lex, MD;  Location: [facility] CATH LAB;  Service: Cardiovascular;  Laterality: N/A;  . Left heart catheterization with coronary angiogram N/A 10/20/2013    Procedure: LEFT HEART CATHETERIZATION WITH CORONARY ANGIOGRAM;  Surgeon: Marykay Lex, MD;  Location: [facility] CATH LAB;  Service: Cardiovascular;  Laterality: N/A;  . Nm myoview ltd  10/19/2013    INTERMEDIATE RISK, septal, anterior-anterolateral ischemia --> See   Family History  Problem Relation Age of Onset  . Cancer - Colon Mother 84  . Heart attack Father 75    open heart surgery/valve replaced  . Heart failure Maternal Grandmother 91  .  Cancer Paternal Grandmother 37  . Heart failure Paternal Grandfather 60   History  Substance Use Topics  . Smoking status: Never Smoker   . Smokeless tobacco: Never Used  . Alcohol Use: No    Review of Systems  Constitutional: Negative for fever, chills and fatigue.  HENT: Negative for trouble swallowing.   Eyes: Negative for visual disturbance.  Respiratory: Negative for shortness of breath.   Cardiovascular: Negative for chest pain and palpitations.  Gastrointestinal: Negative for nausea, vomiting,  abdominal pain and diarrhea.  Genitourinary: Negative for dysuria and difficulty urinating.  Musculoskeletal: Positive for arthralgias. Negative for neck pain.  Skin: Negative for color change.  Neurological: Positive for headaches. Negative for dizziness and weakness.  Psychiatric/Behavioral: Negative for dysphoric mood.      Allergies  Review of patient's allergies indicates no known allergies.  Home Medications   Prior to Admission medications   Medication Sig Start Date End Date Taking? Authorizing Provider  aspirin EC 81 MG tablet Take 81 mg by mouth daily.   Yes Historical Provider, MD  atorvastatin (LIPITOR) 20 MG tablet TAKE 1 TABLET BY MOUTH DAILY 08/09/14  Yes Marykay Lex, MD  clonazePAM (KLONOPIN) 1 MG tablet Take 1 mg by mouth 2 (two) times daily as needed for anxiety. Sleep/anixety   Yes Historical Provider, MD  clopidogrel (PLAVIX) 75 MG tablet TAKE 1 TABLET BY MOUTH DAILY 07/15/14  Yes Marykay Lex, MD  finasteride (PROSCAR) 5 MG tablet Take 5 mg by mouth daily.   Yes Historical Provider, MD  fluticasone (FLONASE) 50 MCG/ACT nasal spray Place 1 spray into the nose daily as needed. For allergies   Yes Historical Provider, MD  lisinopril (PRINIVIL,ZESTRIL) 5 MG tablet TAKE 1 TABLET BY MOUTH DAILY. 02/08/14  Yes Marykay Lex, MD  nitroGLYCERIN (NITROSTAT) 0.4 MG SL tablet Place 1 tablet (0.4 mg total) under the tongue every 5 (five) minutes as needed for chest pain. 10/21/13  Yes Leone Brand, NP  PARoxetine (PAXIL) 10 MG tablet Take 15 mg by mouth daily.    Yes Historical Provider, MD   BP 195/109 mmHg  Pulse 68  Temp(Src) 97.9 F (36.6 C) (Oral)  Resp 16  SpO2 98% Physical Exam  Constitutional: He is oriented to person, place, and time. He appears well-developed and well-nourished. No distress.  HENT:  Head: Normocephalic and atraumatic.  Abrasion noted of parietooccipital area of scalp. No open wound.   Eyes: Conjunctivae and EOM are normal. Pupils are  equal, round, and reactive to light.  Neck: Normal range of motion.  Cardiovascular: Normal rate and regular rhythm.  Exam reveals no gallop and no friction rub.   No murmur heard. Pulmonary/Chest: Effort normal and breath sounds normal. He has no wheezes. He has no rales. He exhibits no tenderness.  Abdominal: Soft. He exhibits no distension. There is no tenderness. There is no rebound.  Musculoskeletal: Normal range of motion.  No midline spine tenderness to palpation. Right gluteal and sacral tenderness to palpation.   Neurological: He is alert and oriented to person, place, and time. Coordination normal.  Speech is goal-oriented. Moves limbs without ataxia.   Skin: Skin is warm and dry.  Psychiatric: He has a normal mood and affect. His behavior is normal.  Nursing note and vitals reviewed.   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 Initial encounter.  EXAM: SACRUM AND COCCYX - 2+ VIEW  COMPARISON:  None.  FINDINGS: No acute fractures involving the sacrum. Sacroiliac joints intact. Symphysis pubis intact with degenerative changes.  IMPRESSION: No acute osseous abnormality.   Electronically Signed   By: Hulan Saas M.D.   On: 08/21/2014 11:22   Ct Head Wo Contrast  08/21/2014   CLINICAL DATA:  Fall with posterior head injury on brick wall. Dizziness and headache. History of chronic anticoagulation.  EXAM: CT HEAD WITHOUT CONTRAST  TECHNIQUE: Contiguous axial images were obtained from the base of the skull through the vertex without intravenous contrast.  COMPARISON:  03/31/2009  FINDINGS: Stable advanced small vessel disease in the periventricular white matter. The brain demonstrates no evidence of hemorrhage, acute infarction, edema, mass effect, extra-axial fluid collection,  hydrocephalus or mass lesion. The skull is unremarkable. No evidence of skull fracture.  IMPRESSION: No acute findings.  Stable advanced small vessel disease.   Electronically Signed   By: Irish Lack M.D.   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 Initial encounter.  EXAM: RIGHT HIP (WITH PELVIS) 2-3 VIEWS  COMPARISON:  None.  FINDINGS: No evidence of acute fracture or dislocation. Mild axial joint space narrowing with mild spurring along the inferior wall of the acetabulum. Bone mineral density well-preserved.  Included AP pelvis demonstrates symmetric mild axial joint space narrowing in the contralateral left hip. Sacroiliac joints intact. Symphysis pubis intact with mild degenerative changes. Degenerative changes involving the lumbosacral junction.  IMPRESSION: 1. No acute osseous abnormality. 2. Mild osteoarthritis.   Electronically Signed   By: Hulan Saas M.D.   On: 08/21/2014 11:21     EKG Interpretation None      MDM   Final diagnoses:  Fall  Head injury, initial encounter  Lower back injury, initial encounter    10:19 AM Imaging of head and pelvis and sacrum pending. Patient declines pain medication. Vitals stable and patient afebrile.   11:48 AM Imaging unremarkable for acute changes. Patient will be discharged with Vicodin and robaxin.   Emilia Beck, PA-C 08/21/14 1148  Richardean Canal, MD 08/21/14 [phone]

## 2014-08-24 ENCOUNTER — Encounter (HOSPITAL_COMMUNITY): Payer: Self-pay | Admitting: Emergency Medicine

## 2014-08-24 ENCOUNTER — Emergency Department (HOSPITAL_COMMUNITY)
Admission: EM | Admit: 2014-08-24 | Discharge: 2014-08-24 | Disposition: A | Discharge status: Home / Self Care | Attending: Emergency Medicine | Admitting: Emergency Medicine

## 2014-08-24 DIAGNOSIS — F419 Anxiety disorder, unspecified: Secondary | ICD-10-CM | POA: Insufficient documentation

## 2014-08-24 DIAGNOSIS — I252 Old myocardial infarction: Secondary | ICD-10-CM | POA: Diagnosis not present

## 2014-08-24 DIAGNOSIS — M1990 Unspecified osteoarthritis, unspecified site: Secondary | ICD-10-CM | POA: Diagnosis not present

## 2014-08-24 DIAGNOSIS — N400 Enlarged prostate without lower urinary tract symptoms: Secondary | ICD-10-CM | POA: Insufficient documentation

## 2014-08-24 DIAGNOSIS — I159 Secondary hypertension, unspecified: Secondary | ICD-10-CM

## 2014-08-24 DIAGNOSIS — W01198A Fall on same level from slipping, tripping and stumbling with subsequent striking against other object, initial encounter: Secondary | ICD-10-CM | POA: Diagnosis not present

## 2014-08-24 DIAGNOSIS — Z9861 Coronary angioplasty status: Secondary | ICD-10-CM | POA: Diagnosis not present

## 2014-08-24 DIAGNOSIS — Z7902 Long term (current) use of antithrombotics/antiplatelets: Secondary | ICD-10-CM | POA: Insufficient documentation

## 2014-08-24 DIAGNOSIS — S134XXA Sprain of ligaments of cervical spine, initial encounter: Secondary | ICD-10-CM | POA: Diagnosis not present

## 2014-08-24 DIAGNOSIS — E780 Pure hypercholesterolemia: Secondary | ICD-10-CM | POA: Insufficient documentation

## 2014-08-24 DIAGNOSIS — Y998 Other external cause status: Secondary | ICD-10-CM | POA: Insufficient documentation

## 2014-08-24 DIAGNOSIS — F319 Bipolar disorder, unspecified: Secondary | ICD-10-CM | POA: Diagnosis not present

## 2014-08-24 DIAGNOSIS — Z9889 Other specified postprocedural states: Secondary | ICD-10-CM | POA: Insufficient documentation

## 2014-08-24 DIAGNOSIS — S139XXA Sprain of joints and ligaments of unspecified parts of neck, initial encounter: Secondary | ICD-10-CM

## 2014-08-24 DIAGNOSIS — Y9289 Other specified places as the place of occurrence of the external cause: Secondary | ICD-10-CM | POA: Insufficient documentation

## 2014-08-24 DIAGNOSIS — Z7982 Long term (current) use of aspirin: Secondary | ICD-10-CM | POA: Insufficient documentation

## 2014-08-24 DIAGNOSIS — S0990XA Unspecified injury of head, initial encounter: Secondary | ICD-10-CM

## 2014-08-24 DIAGNOSIS — Z79899 Other long term (current) drug therapy: Secondary | ICD-10-CM | POA: Diagnosis not present

## 2014-08-24 DIAGNOSIS — Z0000 Encounter for general adult medical examination without abnormal findings: Secondary | ICD-10-CM | POA: Diagnosis present

## 2014-08-24 DIAGNOSIS — Y9389 Activity, other specified: Secondary | ICD-10-CM | POA: Insufficient documentation

## 2014-08-24 DIAGNOSIS — I2510 Atherosclerotic heart disease of native coronary artery without angina pectoris: Secondary | ICD-10-CM | POA: Diagnosis not present

## 2014-08-24 DIAGNOSIS — M069 Rheumatoid arthritis, unspecified: Secondary | ICD-10-CM | POA: Diagnosis not present

## 2014-08-24 DIAGNOSIS — Z8719 Personal history of other diseases of the digestive system: Secondary | ICD-10-CM | POA: Insufficient documentation

## 2014-08-24 NOTE — ED Notes (Signed)
Patient states he had a fall x a few days ago.   Patient states his R hip is feeling better and the bruising is better.   Patient states he still has a small amount of neck pain, but that it is getting better also.   Patient denies dizziness or other problems.   Patient here today to get form filled out for work so he can return.

## 2014-08-24 NOTE — Discharge Instructions (Signed)
Please follow-up with your primary care doctor as needed. Please follow-up to recheck your blood pressure in 1-2 weeks.  Head Injury You have received a head injury. It does not appear serious at this time. Headaches and vomiting are common following head injury. It should be easy to awaken from sleeping. Sometimes it is necessary for you to stay in the emergency department for a while for observation. Sometimes admission to the hospital may be needed. After injuries such as yours, most problems occur within the first 24 hours, but side effects may occur up to 7-10 days after the injury. It is important for you to carefully monitor your condition and contact your health care provider or seek immediate medical care if there is a change in your condition. WHAT ARE THE TYPES OF HEAD INJURIES? Head injuries can be as minor as a bump. Some head injuries can be more severe. More severe head injuries include:  A jarring injury to the brain (concussion).  A bruise of the brain (contusion). This mean there is bleeding in the brain that can cause swelling.  A cracked skull (skull fracture).  Bleeding in the brain that collects, clots, and forms a bump (hematoma). WHAT CAUSES A HEAD INJURY? A serious head injury is most likely to happen to someone who is in a car wreck and is not wearing a seat belt. Other causes of major head injuries include bicycle or motorcycle accidents, sports injuries, and falls. HOW ARE HEAD INJURIES DIAGNOSED? A complete history of the event leading to the injury and your current symptoms will be helpful in diagnosing head injuries. Many times, pictures of the brain, such as CT or MRI are needed to see the extent of the injury. Often, an overnight hospital stay is necessary for observation.  WHEN SHOULD I SEEK IMMEDIATE MEDICAL CARE?  You should get help right away if:  You have confusion or drowsiness.  You feel sick to your stomach (nauseous) or have continued, forceful  vomiting.  You have dizziness or unsteadiness that is getting worse.  You have severe, continued headaches not relieved by medicine. Only take over-the-counter or prescription medicines for pain, fever, or discomfort as directed by your health care provider.  You do not have normal function of the arms or legs or are unable to walk.  You notice changes in the black spots in the center of the colored part of your eye (pupil).  You have a clear or bloody fluid coming from your nose or ears.  You have a loss of vision. During the next 24 hours after the injury, you must stay with someone who can watch you for the warning signs. This person should contact local emergency services (911 in the [place] if you have seizures, you become unconscious, or you are unable to wake up. HOW CAN I PREVENT A HEAD INJURY IN THE FUTURE? The most important factor for preventing major head injuries is avoiding motor vehicle accidents. To minimize the potential for damage to your head, it is crucial to wear seat belts while riding in motor vehicles. Wearing helmets while bike riding and playing collision sports (like football) is also helpful. Also, avoiding dangerous activities around the house will further help reduce your risk of head injury.  WHEN CAN I RETURN TO NORMAL ACTIVITIES AND ATHLETICS? You should be reevaluated by your health care provider before returning to these activities. If you have any of the following symptoms, you should not return to activities or contact sports until 1 week  after the symptoms have stopped:  Persistent headache.  Dizziness or vertigo.  Poor attention and concentration.  Confusion.  Memory problems.  Nausea or vomiting.  Fatigue or tire easily.  Irritability.  Intolerant of bright lights or loud noises.  Anxiety or depression.  Disturbed sleep. MAKE SURE YOU:   Understand these instructions.  Will watch your condition.  Will get help right away if you are  not doing well or get worse. Document Released: 03/19/2005 Document Revised: 03/24/2013 Document Reviewed: 11/24/2012 [employer]  ient Information [address], [place]. This information is not intended to replace advice given to you by your health care provider. Make sure you discuss any questions you have with your health care provider.    Cervical Sprain A cervical sprain is an injury in the neck in which the strong, fibrous tissues (ligaments) that connect your neck bones stretch or tear. Cervical sprains can range from mild to severe. Severe cervical sprains can cause the neck vertebrae to be unstable. This can lead to damage of the spinal cord and can result in serious nervous system problems. The amount of time it takes for a cervical sprain to get better depends on the cause and extent of the injury. Most cervical sprains heal in 1 to 3 weeks. CAUSES  Severe cervical sprains may be caused by:   Contact sport injuries (such as from football, rugby, wrestling, hockey, auto racing, gymnastics, diving, martial arts, or boxing).   Motor vehicle collisions.   Whiplash injuries. This is an injury from a sudden forward and backward whipping movement of the head and neck.  Falls.  Mild cervical sprains may be caused by:   Being in an awkward position, such as while cradling a telephone between your ear and shoulder.   Sitting in a chair that does not offer proper support.   Working at a poorly Marketing executive station.   Looking up or down for long periods of time.  SYMPTOMS   Pain, soreness, stiffness, or a burning sensation in the front, back, or sides of the neck. This discomfort may develop immediately after the injury or slowly, 24 hours or more after the injury.   Pain or tenderness directly in the middle of the back of the neck.   Shoulder or upper back pain.   Limited ability to move the neck.   Headache.   Dizziness.   Weakness, numbness, or tingling  in the hands or arms.   Muscle spasms.   Difficulty swallowing or chewing.   Tenderness and swelling of the neck.  DIAGNOSIS  Most of the time your health care provider can diagnose a cervical sprain by taking your history and doing a physical exam. Your health care provider will ask about previous neck injuries and any known neck problems, such as arthritis in the neck. X-rays may be taken to find out if there are any other problems, such as with the bones of the neck. Other tests, such as a CT scan or MRI, may also be needed.  TREATMENT  Treatment depends on the severity of the cervical sprain. Mild sprains can be treated with rest, keeping the neck in place (immobilization), and pain medicines. Severe cervical sprains are immediately immobilized. Further treatment is done to help with pain, muscle spasms, and other symptoms and may include:  Medicines, such as pain relievers, numbing medicines, or muscle relaxants.   Physical therapy. This may involve stretching exercises, strengthening exercises, and posture training. Exercises and improved posture can help stabilize the  neck, strengthen muscles, and help stop symptoms from returning.  HOME CARE INSTRUCTIONS   Put ice on the injured area.   Put ice in a plastic bag.   Place a towel between your skin and the bag.   Leave the ice on for 15-20 minutes, 3-4 times a day.   If your injury was severe, you may have been given a cervical collar to wear. A cervical collar is a two-piece collar designed to keep your neck from moving while it heals.  Do not remove the collar unless instructed by your health care provider.  If you have long hair, keep it outside of the collar.  Ask your health care provider before making any adjustments to your collar. Minor adjustments may be required over time to improve comfort and reduce pressure on your chin or on the back of your head.  Ifyou are allowed to remove the collar for cleaning or  bathing, follow your health care provider's instructions on how to do so safely.  Keep your collar clean by wiping it with mild soap and water and drying it completely. If the collar you have been given includes removable pads, remove them every 1-2 days and hand wash them with soap and water. Allow them to air dry. They should be completely dry before you wear them in the collar.  If you are allowed to remove the collar for cleaning and bathing, wash and dry the skin of your neck. Check your skin for irritation or sores. If you see any, tell your health care provider.  Do not drive while wearing the collar.   Only take over-the-counter or prescription medicines for pain, discomfort, or fever as directed by your health care provider.   Keep all follow-up appointments as directed by your health care provider.   Keep all physical therapy appointments as directed by your health care provider.   Make any needed adjustments to your workstation to promote good posture.   Avoid positions and activities that make your symptoms worse.   Warm up and stretch before being active to help prevent problems.  SEEK MEDICAL CARE IF:   Your pain is not controlled with medicine.   You are unable to decrease your pain medicine over time as planned.   Your activity level is not improving as expected.  SEEK IMMEDIATE MEDICAL CARE IF:   You develop any bleeding.  You develop stomach upset.  You have signs of an allergic reaction to your medicine.   Your symptoms get worse.   You develop new, unexplained symptoms.   You have numbness, tingling, weakness, or paralysis in any part of your body.  MAKE SURE YOU:   Understand these instructions.  Will watch your condition.  Will get help right away if you are not doing well or get worse. Document Released: 01/14/2007 Document Revised: 03/24/2013 Document Reviewed: 09/24/2012 [facility] Patient Information 2015 [place], [place]. This  information is not intended to replace advice given to you by your health care provider. Make sure you discuss any questions you have with your health care provider.

## 2014-08-24 NOTE — ED Provider Notes (Signed)
CSN: [id number]     Arrival date & time 08/24/14  0037 History   First MD Initiated Contact with Patient 08/24/14 [phone]     Chief Complaint  Patient presents with  . Follow-up     (Consider location/radiation/quality/duration/timing/severity/associated sxs/prior Treatment) HPI [name] is a 59 y.o. male with history of coronary disease, hypertension, anxiety, presents to emergency department for follow-up after a fall. 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 Patient reports hitting his head, and landing on her bottom. Patient did not have loss of consciousness. He was seen in emergency department at that time, he was complaining of a headache and right hip pain. He had negative CT of the brain, negative right hip x-rays. He does take Plavix, he was discharged with strict return precautions. Patient states he has been doing well since then. He denies any headache, no dizziness, nausea, vomiting. No difficulty ambulating. He states his hip is feeling much better. He reports pain to bilateral neck. He denies any numbness or weakness in extremities. No midline neck pain. No back pain. He is requesting a paperwork filled out that would allow him to return back to work. He does not think he has any limitations to do his job at this time.  Past Medical History  Diagnosis Date  . ST elevation myocardial infarction (STEMI) involving right coronary artery in recovery phase 06/2011    100% occluded RCA; PCI - 3.0x24 Promus DES (3.5-3.6 mm) ; Modwith moderate LAD & Cx disease, EF 55-60% w/ basal inferoseptal HK confirmed by echo  . CAD S/P percutaneous coronary angioplasty 06/2011, 09/2013    (Therapeutic P2Y12 on Plavix) STEMI -mid RCA DES PCI; 2015: Class III angina (abnormal stress test with anterior lateral ischemia -> mid-distal LAD 95-99% (PCI: Biotronik AG 2 DES 2.5 x 13, 2.75 mm), D1 proximal 50-60% (FFR 0.6) with 60-70% in  small branch (med management), mid circumflex 50-60% (FFR 1.0)   . Abnormal nuclear stress test 10/21/2013    Intermediate Risk - septal, anterolateral ischemia --> cath with LAD, diagonal and circumflex disease.   Marland Kitchen Dyslipidemia, goal LDL below 70   . Essential hypertension   . Anxiety   . BPH (benign prostatic hyperplasia)   . GERD (gastroesophageal reflux disease)   . Arthritis     "mostly in my hands; sometimes other areas" (10/20/2013)  . RA (rheumatoid arthritis)   . Depression   . Bipolar disorder   . Rheumatic fever 1969  . Dizziness of unknown cause January 2011    abn MRI, Neuro w/u Jan 2011   Past Surgical History  Procedure Laterality Date  . Shoulder arthroscopy w/ rotator cuff repair Left July 2011  . Percutaneous coronary stent intervention (pci-s)  06/2011; 09/2013    a) 3/'13: 100%  RCA ->PCI 3.20x2 (3.6) Promus DES stent; 7/'15: mLAD PCI Biotronik AG DES 2.5 x13 2.75 mm), FFR D1 50-60% = 0.86 (small branch had ~70%), mCx ~50-60% FFR=1.0.  . Plantar fascia surgery Left 2004    "& neuroma"  . Transthoracic echocardiogram  March 2013    EF 55%-60% with mild basilar inferoseptal hypokinesis also noted on LV-gram  . Left heart catheterization with coronary angiogram N/A 06/28/2011    Procedure: LEFT HEART CATHETERIZATION WITH CORONARY ANGIOGRAM;  Surgeon: Marykay Lex, MD;  Location: [facility] CATH LAB;  Service: Cardiovascular;  Laterality: N/A;  . Left heart catheterization with coronary angiogram N/A 10/20/2013  Procedure: LEFT HEART CATHETERIZATION WITH CORONARY ANGIOGRAM;  Surgeon: Marykay Lex, MD;  Location: [facility] CATH LAB;  Service: Cardiovascular;  Laterality: N/A;  . Nm myoview ltd  10/19/2013    INTERMEDIATE RISK, septal, anterior-anterolateral ischemia --> See   Family History  Problem Relation Age of Onset  . Cancer - Colon Mother 42  . Heart attack Father 75    open heart surgery/valve replaced  . Heart failure Maternal Grandmother 91  . Cancer Paternal  Grandmother 21  . Heart failure Paternal Grandfather 42   History  Substance Use Topics  . Smoking status: Never Smoker   . Smokeless tobacco: Never Used  . Alcohol Use: No    Review of Systems  Constitutional: Negative for fever and chills.  Respiratory: Negative for cough, chest tightness and shortness of breath.   Cardiovascular: Negative for chest pain, palpitations and leg swelling.  Gastrointestinal: Negative for nausea, vomiting, abdominal pain, diarrhea and abdominal distention.  Musculoskeletal: Positive for neck pain. Negative for myalgias, arthralgias and neck stiffness.  Skin: Negative for rash.  Allergic/Immunologic: Negative for immunocompromised state.  Neurological: Negative for dizziness, weakness, light-headedness, numbness and headaches.      Allergies  Review of patient's allergies indicates no known allergies.  Home Medications   Prior to Admission medications   Medication Sig Start Date End Date Taking? Authorizing Provider  aspirin EC 81 MG tablet Take 81 mg by mouth daily.    Historical Provider, MD  atorvastatin (LIPITOR) 20 MG tablet TAKE 1 TABLET BY MOUTH DAILY 08/09/14   Marykay Lex, MD  clonazePAM (KLONOPIN) 1 MG tablet Take 1 mg by mouth 2 (two) times daily as needed for anxiety. Sleep/anixety    Historical Provider, MD  clopidogrel (PLAVIX) 75 MG tablet TAKE 1 TABLET BY MOUTH DAILY 07/15/14   Marykay Lex, MD  finasteride (PROSCAR) 5 MG tablet Take 5 mg by mouth daily.    Historical Provider, MD  fluticasone (FLONASE) 50 MCG/ACT nasal spray Place 1 spray into the nose daily as needed. For allergies    Historical Provider, MD  HYDROcodone-acetaminophen (NORCO/VICODIN) 5-325 MG per tablet Take 2 tablets by mouth every 4 (four) hours as needed. 08/21/14   Kaitlyn Szekalski, PA-C  lisinopril (PRINIVIL,ZESTRIL) 5 MG tablet TAKE 1 TABLET BY MOUTH DAILY. 02/08/14   Marykay Lex, MD  methocarbamol (ROBAXIN) 500 MG tablet Take 1 tablet (500 mg total)  by mouth 2 (two) times daily. 08/21/14   Kaitlyn Szekalski, PA-C  nitroGLYCERIN (NITROSTAT) 0.4 MG SL tablet Place 1 tablet (0.4 mg total) under the tongue every 5 (five) minutes as needed for chest pain. 10/21/13   Leone Brand, NP  PARoxetine (PAXIL) 10 MG tablet Take 15 mg by mouth daily.     Historical Provider, MD   BP 166/99 mmHg  Pulse 80  Temp(Src) 98.9 F (37.2 C) (Oral)  Resp 16  SpO2 100% Physical Exam  Constitutional: He is oriented to person, place, and time. He appears well-developed and well-nourished. No distress.  HENT:  Head: Normocephalic and atraumatic.  Right Ear: External ear normal.  Left Ear: External ear normal.  Eyes: Conjunctivae and EOM are normal. Pupils are equal, round, and reactive to light.  Neck: Neck supple.  Tenderness over bilateral sternocleidomastoid, full range of motion of the neck.  Cardiovascular: Normal rate, regular rhythm and normal heart sounds.   Pulmonary/Chest: Effort normal. No respiratory distress. He has no wheezes. He has no rales.  Abdominal: Soft. Bowel sounds are normal. He  exhibits no distension. There is no tenderness. There is no rebound.  Musculoskeletal: He exhibits no edema.  Neurological: He is alert and oriented to person, place, and time. No cranial nerve deficit. Coordination normal.  5/5 and equal upper and lower extremity strength bilaterally. Equal grip strength bilaterally. Normal finger to nose and heel to shin. No pronator drift. Gait is normal   Skin: Skin is warm and dry.  Nursing note and vitals reviewed.   ED Course  Procedures (including critical care time) Labs Review Labs Reviewed - No data to display  Imaging Review No results found.   EKG Interpretation None      MDM   Final diagnoses:  Minor head injury, initial encounter  Cervical sprain, initial encounter  Secondary hypertension, unspecified   Patient is here for recheck after a fall 3 days ago. He denies any complaints. He has  slight bilateral neck tenderness with no midline tenderness. Full range of motion of the neck. No headache, dizziness, vomiting, memory loss, confusion, difficulty moving extremities. He is requesting paperwork filled out to go back to work. I believe patient is able to go back to work at this time. Addressed his elevated blood pressure, patient is aware and will follow-up with his doctor.  Filed Vitals:   08/24/14 0745  BP: 166/99  Pulse: 80  Temp: 98.9 F (37.2 C)  TempSrc: Oral  Resp: 16  SpO2: 100%       Jaynie Crumble, PA-C 08/24/14 1022  Richardean Canal, MD 08/24/14 1525

## 2014-09-13 LAB — COMPREHENSIVE METABOLIC PANEL
ALT: 36 U/L (ref 0–53)
AST: 21 U/L (ref 0–37)
Albumin: 4.6 g/dL (ref 3.5–5.2)
Alkaline Phosphatase: 67 U/L (ref 39–117)
BUN: 27 mg/dL — AB (ref 6–23)
CO2: 30 meq/L (ref 19–32)
CREATININE: 0.86 mg/dL (ref 0.50–1.35)
Calcium: 9.7 mg/dL (ref 8.4–10.5)
Chloride: 101 mEq/L (ref 96–112)
GLUCOSE: 98 mg/dL (ref 70–99)
Potassium: 4.7 mEq/L (ref 3.5–5.3)
SODIUM: 141 meq/L (ref 135–145)
TOTAL PROTEIN: 6.9 g/dL (ref 6.0–8.3)
Total Bilirubin: 0.7 mg/dL (ref 0.2–1.2)

## 2014-09-14 ENCOUNTER — Other Ambulatory Visit: Payer: Self-pay | Admitting: Cardiology

## 2014-09-14 NOTE — Telephone Encounter (Signed)
Rx(s) sent to pharmacy electronically.  

## 2014-09-15 LAB — NMR LIPOPROFILE WITH LIPIDS
Cholesterol, Total: 205 mg/dL — ABNORMAL HIGH (ref 100–199)
HDL PARTICLE NUMBER: 39.9 umol/L (ref >=30.5)
HDL Size: 8.4 nm — ABNORMAL LOW (ref >=9.2)
HDL-C: 50 mg/dL (ref >39)
LARGE HDL: 1.9 umol/L — AB (ref >=4.8)
LARGE VLDL-P: 9.6 nmol/L — AB (ref <=2.7)
LDL (calc): 122 mg/dL — ABNORMAL HIGH (ref 0–99)
LDL Particle Number: 1920 nmol/L — ABNORMAL HIGH (ref <1000)
LDL SIZE: 20.4 nm (ref >=20.8)
LP-IR Score: 95 — ABNORMAL HIGH (ref <=45)
SMALL LDL PARTICLE NUMBER: 1126 nmol/L — AB (ref <=527)
Triglycerides: 164 mg/dL — ABNORMAL HIGH (ref 0–149)
VLDL Size: 61.9 nm — ABNORMAL HIGH (ref <=46.6)

## 2014-09-20 NOTE — Progress Notes (Signed)
PATIENT: [name] MRN: [id number]  DOB: Apr 23, 1955   DOV:09/23/2014 PCP: Kristie Cowman, MD  Clinic Note: Chief Complaint  Patient presents with  . Follow-up    26month exam; no chest pain, no shortness of breath, no edema, no pain in legs, no cramping in legs, occassional lightheadedness, occassional dizziness  . 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 -->  Cardiac Cath => severe mid LAD disease treated with DES stent that was a new bioabsorbable polymer study stent. He also had lesions in the first diagonal and mid circumflex that were evaluated with FFR.  He was continued on Plavix  Last seen Jan 2016  Interval History:  Rate he presents today really without major cardiac symptoms to speak of.  His wife notes that he is not exercising as much. Still up & out with work - mostly in vehicle, but dismounts a lot & lots of lifting. Eating out more than usual 0 ~5 x week. His wife however indicates that he still doesn't have the motivation to get up and go and exercise. He seems short of breath with doing moderate exertion. He denies this, but admits that he just sometimes feels tired in the work Has periods of feeling "hard" heart beat - up near throat, but no resting or exertional chest tightness or pressure. He does note occasional Positional dizziness/lightheadedness when getting in and out of his truck..  He denies any resting or exertional chest discomfort or significant dyspnea. Nothing suggestive of his previous angina. No dyspnea with rest or exertion. No heart failure symptoms of PND, orthopnea or edema. No palpitations, lightheadedness, dizziness, weakness, syncope/near syncope, or TIA/amaurosis fugax symptoms.  Past Medical History  Diagnosis Date  . ST elevation myocardial  infarction (STEMI) involving right coronary artery in recovery phase 06/2011    100% occluded RCA; PCI - 3.0x24 Promus DES (3.5-3.6 mm) ; Modwith moderate LAD & Cx disease, EF 55-60% w/ basal inferoseptal HK confirmed by echo  . CAD S/P percutaneous coronary angioplasty 06/2011, 09/2013    (Therapeutic P2Y12 on Plavix);; a) STEMI -mid RCA DES PCI; 2015: Class III angina (abnormal stress test with anterior- lateral ischemia -> mid-distal LAD 95-99% (PCI: Biotronik AG 2 DES 2.5 x 13, 2.75 mm), D1 proximal 50-60% (FFR 0.6) with 60-70% in small branch (med management), mid circumflex 50-60% (FFR 1.0)   . Abnormal nuclear stress test 10/21/2013    Intermediate Risk - septal, anterolateral ischemia --> cath with LAD, diagonal and circumflex disease.   Marland Kitchen Dyslipidemia, goal LDL below 70   . Essential hypertension   . Anxiety   . BPH (benign prostatic hyperplasia)   . GERD (gastroesophageal reflux disease)   . Arthritis     "mostly in my hands; sometimes other areas" (10/20/2013)  . RA (rheumatoid arthritis)   . Depression   . Bipolar disorder   . Rheumatic fever 1969  . Dizziness of unknown cause January 2011    abn MRI, Neuro w/u Jan 2011   Past Surgical History  Procedure Laterality Date  . Shoulder arthroscopy w/ rotator cuff repair Left July 2011  . Percutaneous coronary stent intervention (pci-s)  06/2011; 09/2013    a) 3/'13: 100%  RCA ->PCI 3.20x2 (3.6) Promus DES stent; 7/'15: mLAD  PCI Biotronik AG DES 2.5 x13 2.75 mm), FFR D1 50-60% = 0.86 (small branch had ~70%), mCx ~50-60% FFR=1.0.  . Plantar fascia surgery Left 2004    "& neuroma"  . Transthoracic echocardiogram  March 2013    EF 55%-60% with mild basilar inferoseptal hypokinesis also noted on LV-gram  . Left heart catheterization with coronary angiogram N/A 06/28/2011    Procedure: LEFT HEART CATHETERIZATION WITH CORONARY ANGIOGRAM;  Surgeon: Marykay Lex, MD;  Location: [facility] CATH LAB;  Service: Cardiovascular;  Laterality: N/A;  . Left  heart catheterization with coronary angiogram N/A 10/20/2013    Procedure: LEFT HEART CATHETERIZATION WITH CORONARY ANGIOGRAM;  Surgeon: Marykay Lex, MD;  Location: [facility] CATH LAB;  Service: Cardiovascular;  Laterality: N/A;  . Nm myoview ltd  10/19/2013    INTERMEDIATE RISK, septal, anterior-anterolateral ischemia --> referred for cath the following day    No Known Allergies  Current Outpatient Prescriptions  Medication Sig Dispense Refill  . clonazePAM (KLONOPIN) 0.5 MG tablet Take 1 tablet by mouth 2 (two) times daily. Take 1 tab twice daily    . clonazePAM (KLONOPIN) 1 MG tablet Take 1 mg by mouth 2 (two) times daily as needed for anxiety. Sleep/anixety    . clopidogrel (PLAVIX) 75 MG tablet TAKE 1 TABLET BY MOUTH DAILY 30 tablet 11  . finasteride (PROSCAR) 5 MG tablet Take 5 mg by mouth daily.    . fluticasone (FLONASE) 50 MCG/ACT nasal spray Place 1 spray into the nose daily as needed. For allergies    . nitroGLYCERIN (NITROSTAT) 0.4 MG SL tablet Place 1 tablet (0.4 mg total) under the tongue every 5 (five) minutes as needed for chest pain. 25 tablet 4  . PARoxetine (PAXIL) 10 MG tablet Take 15 mg by mouth daily.     Marland Kitchen atorvastatin (LIPITOR) 40 MG tablet Take 1 tablet (40 mg total) by mouth daily. 90 tablet 3  . lisinopril (PRINIVIL,ZESTRIL) 10 MG tablet Take 1 tablet (10 mg total) by mouth daily. 90 tablet 3   No current facility-administered medications for this visit.   History   Social History Narrative   Married father of 2. Never smoked. Does not drink alcohol significantly.   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   He does play golf on a fairly regular basis, but is currently not walking between holes, but riding. He is starting to play with a new partner who likes to walk, so he is hoping to get more walking.   family history includes Cancer (age of onset: 39) in his  paternal grandmother; Cancer - Colon (age of onset: 32) in his mother; Heart attack (age of onset: 55) in his father; Heart failure (age of onset: 59) in his paternal grandfather; Heart failure (age of onset: 47) in his maternal grandmother.   ROS: A comprehensive Review of Systems - was performed Review of Systems  Constitutional: Positive for malaise/fatigue (not really fatigued. Just tired at the end of work.  Not as much as usual -- just not in the habit  of doing anything else.).  HENT: Negative for nosebleeds.   Eyes: Negative for blurred vision.  Respiratory: Positive for shortness of breath (if he really pushes himself - nothing out of ordinary). Negative for cough and wheezing.   Cardiovascular: Negative for claudication.       Per HPI  Gastrointestinal: Positive for constipation. Negative  for blood in stool and melena.  Genitourinary: Negative for hematuria and flank pain.  Musculoskeletal: Positive for joint pain and falls (slipped on wet surface last month).  Neurological: Negative for dizziness and headaches.  Endo/Heme/Allergies: Does not bruise/bleed easily (much better with Plavix then Brilinta).  Psychiatric/Behavioral: Positive for depression (Lack of motivation; increased stress -- no manic episodes). Negative for memory loss. The patient is not nervous/anxious and does not have insomnia.   All other systems reviewed and are negative.   PHYSICAL EXAM BP 154/90 mmHg  Pulse 64  Ht 5\' 10"  (1.778 m)  Wt 87.862 kg (193 lb 11.2 oz)  BMI 27.79 kg/m2 General appearance: alert, cooperative, appears stated age, no distress and Healthy-appearing, well-nourished/well-groomed. Present mood and affect Neck: no adenopathy, no carotid bruit, no JVD and supple, symmetrical, trachea midline Lungs: CTA B, normal percussion bilaterally and Nonlabored, good air movement Heart: RRR, S1, S2 normal, no murmur, click, rub or gallop and normal apical impulse Abdomen: soft, non-tender; bowel  sounds normal; no masses,  no organomegaly Extremities: extremities normal, atraumatic, no cyanosis or edema, no edema, redness or tenderness in the calves or thighs and no ulcers, gangrene or trophic changes Pulses: 2+ and symmetric Neurologic: A&OX 3, normal strength and tone. Normal symmetric reflexes. Normal coordination and gait   Adult ECG Report  Rate: 64 ;  Rhythm: normal sinus rhythm and With first degree AV block, ~ Q waves in III, avF cro Inf MI - age undetermined.  Voltages: LVH;  Conduction Disturbances: first-degree A-V block (214)  Narrative Interpretation: Essentially normal EKG. No significant change  Recent Labs: No recent labs. Monitored by PCP.  Lab Results  Component Value Date   CHOL 205* 09/13/2014   HDL 50 09/13/2014   LDLCALC 122* 09/13/2014   TRIG 164* 09/13/2014   CHOLHDL 5.8 06/29/2011    ASSESSMENT / PLAN: Problem List Items Addressed This Visit    Angina, class II - decreased exercise tolerance, dyspnea & fatigue    Resolved following his PCI to the LAD. No angina from the diagonal branch disease treated medically.      Relevant Medications   lisinopril (PRINIVIL,ZESTRIL) 10 MG tablet   atorvastatin (LIPITOR) 40 MG tablet   Anxiety (Chronic)    Stable on Paxil.      CAD S/P percutaneous coronary angioplasty; therapeutic P2Y12 for Plavix (Chronic)    History of PCI to the LAD as well as RCA. Now all on aspirin plus Plavix. Notably less bleeding. With the extent of his stents, and continued on dual antiplatelet therapy.however, I think now is far enough out that he can stop his aspirin. On statin and ACE inhibitor. Not on beta blocker due to his fatigue and depression issues.      Relevant Medications   lisinopril (PRINIVIL,ZESTRIL) 10 MG tablet   atorvastatin (LIPITOR) 40 MG tablet   Other Relevant Orders   EKG 12-Lead (Completed)   Lipid panel   Comprehensive metabolic panel   Dyslipidemia, goal LDL below 70 (Chronic)    None goal.  Increasing Lipitor back to 40 mg. Also the importance of continued exercise and dietary adjustments. This may partly be related to his frequent eating out.  Recheck CMP and lipids in roughly 3 months.      Relevant Medications   lisinopril (PRINIVIL,ZESTRIL) 10 MG tablet   atorvastatin (LIPITOR) 40 MG tablet   Other Relevant Orders   EKG 12-Lead (Completed)   Lipid panel   Comprehensive metabolic panel   Essential hypertension (Chronic)  Pressure is not adequately controlled today. I do want to give him some more afterload reduction without adding additional medications. I will increase lisinopril 10 mg. We can reassess his renal function when we recheck lipids. Discussed dietary changes with less eating out and more healthy food choices.      Relevant Medications   lisinopril (PRINIVIL,ZESTRIL) 10 MG tablet   atorvastatin (LIPITOR) 40 MG tablet   Other Relevant Orders   EKG 12-Lead (Completed)   Lipid panel   Comprehensive metabolic panel   History of ST elevation myocardial infarction (STEMI) of inferior wall - Primary (Chronic)    No evidence of infarction on Myoview stress test performed in the setting of concerning chest pain from stable angina. There was ischemia but no infarction noted. No further anginal or heart failure symptoms since his last cath. He seemed to be stabilized from a psychosocial standpoint on low-dose Paxil. Rarely taking Klonopin.      Relevant Medications   lisinopril (PRINIVIL,ZESTRIL) 10 MG tablet   atorvastatin (LIPITOR) 40 MG tablet   Other Relevant Orders   EKG 12-Lead (Completed)   Lipid panel   Comprehensive metabolic panel    Other Visit Diagnoses    Drug therapy        Relevant Orders    Lipid panel    Comprehensive metabolic panel       Patient Iinstructions: STOP ASPIRIN 81 MG  INCREASE LISINOPRIL 10 MG ONE TABLET DAILY  INCREASE ATORVASTATIN 40 MG TAKE ONE TABLET AT BEDTIME.  WILL RECHECK CHOLESTEROL LEVEL IN 3-4 MONTHS-  WILL MAIL LAB SLIP AT THAT TIME.  Orders Placed This Encounter  Procedures  . Lipid panel    Standing Status: Future     Number of Occurrences:      Standing Expiration Date: 09/21/2015    Order Specific Question:  Has the patient fasted?    Answer:  Yes  . Comprehensive metabolic panel    Standing Status: Future     Number of Occurrences:      Standing Expiration Date: 09/21/2015    Order Specific Question:  Has the patient fasted?    Answer:  Yes  . EKG 12-Lead   Your physician wants you to follow-up in 6 MONTH DR Ailed Defibaugh.   Marybeth Dandy W. Herbie Baltimore, M.D., M.S. Interventional Cardiology [facility]

## 2014-09-21 ENCOUNTER — Encounter: Payer: Self-pay | Admitting: Cardiology

## 2014-09-21 ENCOUNTER — Ambulatory Visit (INDEPENDENT_AMBULATORY_CARE_PROVIDER_SITE_OTHER): Payer: Federal, State, Local not specified - PPO | Admitting: Cardiology

## 2014-09-21 VITALS — BP 154/90 | HR 64 | Ht 70.0 in | Wt 193.7 lb

## 2014-09-21 DIAGNOSIS — I209 Angina pectoris, unspecified: Secondary | ICD-10-CM

## 2014-09-21 DIAGNOSIS — I208 Other forms of angina pectoris: Secondary | ICD-10-CM

## 2014-09-21 DIAGNOSIS — E785 Hyperlipidemia, unspecified: Secondary | ICD-10-CM

## 2014-09-21 DIAGNOSIS — I2119 ST elevation (STEMI) myocardial infarction involving other coronary artery of inferior wall: Secondary | ICD-10-CM | POA: Diagnosis not present

## 2014-09-21 DIAGNOSIS — I10 Essential (primary) hypertension: Secondary | ICD-10-CM

## 2014-09-21 DIAGNOSIS — F419 Anxiety disorder, unspecified: Secondary | ICD-10-CM

## 2014-09-21 DIAGNOSIS — Z9861 Coronary angioplasty status: Secondary | ICD-10-CM

## 2014-09-21 DIAGNOSIS — I2510 Atherosclerotic heart disease of native coronary artery without angina pectoris: Secondary | ICD-10-CM

## 2014-09-21 DIAGNOSIS — Z79899 Other long term (current) drug therapy: Secondary | ICD-10-CM

## 2014-09-21 MED ORDER — ATORVASTATIN CALCIUM 40 MG PO TABS: 40.0000 mg | ORAL_TABLET | Freq: Every day | ORAL | Status: DC

## 2014-09-21 MED ORDER — LISINOPRIL 10 MG PO TABS: 10.0000 mg | ORAL_TABLET | Freq: Every day | ORAL | Status: DC

## 2014-09-21 NOTE — Patient Instructions (Signed)
STOP ASPIRIN 81 MG  INCREASE LISINOPRIL 10 MG ONE TABLET DAILY  INCREASE ATORVASTATIN 40 MG TAKE ONE TABLET AT BEDTIME.  WILL RECHECK CHOLESTEROL LEVEL IN 3-4 MONTHS- WILL MAIL LAB SLIP AT THAT TIME.    Your physician wants you to follow-up in 6 MONTH DR HARDING.  You will receive a reminder letter in the mail two months in advance. If you don't receive a letter, please call our office to schedule the follow-up appointment.

## 2014-09-23 ENCOUNTER — Encounter: Payer: Self-pay | Admitting: Cardiology

## 2014-09-23 NOTE — Assessment & Plan Note (Signed)
No evidence of infarction on Myoview stress test performed in the setting of concerning chest pain from stable angina. There was ischemia but no infarction noted. No further anginal or heart failure symptoms since his last cath. He seemed to be stabilized from a psychosocial standpoint on low-dose Paxil. Rarely taking Klonopin.

## 2014-09-23 NOTE — Assessment & Plan Note (Signed)
Stable on Paxil

## 2014-09-23 NOTE — Assessment & Plan Note (Addendum)
Pressure is not adequately controlled today. I do want to give him some more afterload reduction without adding additional medications. I will increase lisinopril 10 mg. We can reassess his renal function when we recheck lipids. Discussed dietary changes with less eating out and more healthy food choices.

## 2014-09-23 NOTE — Assessment & Plan Note (Signed)
None goal. Increasing Lipitor back to 40 mg. Also the importance of continued exercise and dietary adjustments. This may partly be related to his frequent eating out.  Recheck CMP and lipids in roughly 3 months.

## 2014-09-23 NOTE — Assessment & Plan Note (Signed)
Resolved following his PCI to the LAD. No angina from the diagonal branch disease treated medically.

## 2014-09-23 NOTE — Assessment & Plan Note (Signed)
History of PCI to the LAD as well as RCA. Now all on aspirin plus Plavix. Notably less bleeding. With the extent of his stents, and continued on dual antiplatelet therapy.however, I think now is far enough out that he can stop his aspirin. On statin and ACE inhibitor. Not on beta blocker due to his fatigue and depression issues.

## 2014-12-08 ENCOUNTER — Telehealth: Payer: Self-pay | Admitting: Cardiology

## 2014-12-08 NOTE — Telephone Encounter (Signed)
Not sure that I can justify a work note for Fatigue with Hypertension.   I think we can increase Lisinopril to 20 mg to see how BP reacts.  Marykay Lex, MD

## 2014-12-08 NOTE — Telephone Encounter (Signed)
LVMTCB.  I have not discussed the increase for the Lisinopril and placed a medication order for such in the system.  I have not discussed work note with patient.  Waiting for him to call back

## 2014-12-08 NOTE — Telephone Encounter (Signed)
Please call,blood pressure still elevated. He says it is still up,even after taking his medicine.

## 2014-12-08 NOTE — Telephone Encounter (Signed)
Patient said that he has been fatigued the last two days and has not gone to work, no other symptoms  Took his blood pressure three times today after his medication  169/99 168/94 169/103  Last OV 09/2014 154/90 , Lisinopril was increased to 10 mg  I asked him to take his blood pressure daily for the next 5 days and call us back the readings  Also would like a work note since he has called in to work the last two days from his job at the [employer] because of fatigue

## 2014-12-09 NOTE — Telephone Encounter (Signed)
LVM

## 2014-12-10 ENCOUNTER — Telehealth: Payer: Self-pay

## 2014-12-10 NOTE — Telephone Encounter (Signed)
Called patient on mobile [phone].  LVMTCB  Also LVM that Dr. Herbie Baltimore would like to increase his Lisinopril to 20 mg to see how blood pressure reacts.  I have not changed or ordered medication in system.  I am waiting for him to return call.    Also let him know that Dr. Herbie Baltimore is not sure that he can justify a work note for fatigue and hypertention

## 2014-12-13 NOTE — Telephone Encounter (Signed)
  Patient has not called back after 3 attempts.  No change in medication regimen.  No information shared about work note

## 2014-12-28 ENCOUNTER — Telehealth: Payer: Self-pay | Admitting: Cardiology

## 2014-12-28 MED ORDER — AMLODIPINE BESYLATE 2.5 MG PO TABS: 2.5000 mg | ORAL_TABLET | Freq: Every day | ORAL | Status: DC

## 2014-12-28 MED ORDER — LISINOPRIL 10 MG PO TABS: 20.0000 mg | ORAL_TABLET | Freq: Every day | ORAL | Status: DC

## 2014-12-28 NOTE — Telephone Encounter (Signed)
Pt called in stating that for the past 2 days his BP has been fluctuating( mainly elevated around 153/102) and he is concerned. He thinks that he may been to come in and been seen. Please call  Thanks

## 2014-12-28 NOTE — Telephone Encounter (Signed)
Lets start Amlodipine @ 2.5 mg qhs.  DH

## 2014-12-28 NOTE — Telephone Encounter (Signed)
Returned patient call  States he did increase his lisinopril to 20 mg on 9/10  He has missed two days of week because of his BP being up and having vertigo  Patients Record of BP readings:  9/10  135/77 9/12  165/90  9/17  134/82 9/26  164/99          139/85          148/95          140/84 9/27  153/102          144/93          136/91  9am  Route to Dr. Herbie Baltimore and Dr. Rennis Golden DOD

## 2014-12-28 NOTE — Telephone Encounter (Signed)
Called patient  Instructed him to add Amlodipine 2.5 mg at bed time.  Will take BP daily and call us back with readings in one week

## 2015-03-08 ENCOUNTER — Ambulatory Visit (INDEPENDENT_AMBULATORY_CARE_PROVIDER_SITE_OTHER): Payer: Federal, State, Local not specified - PPO | Admitting: Cardiology

## 2015-03-08 VITALS — BP 142/88 | HR 57 | Ht 70.0 in | Wt 197.0 lb

## 2015-03-08 DIAGNOSIS — Z955 Presence of coronary angioplasty implant and graft: Secondary | ICD-10-CM

## 2015-03-08 DIAGNOSIS — Z79899 Other long term (current) drug therapy: Secondary | ICD-10-CM

## 2015-03-08 DIAGNOSIS — E785 Hyperlipidemia, unspecified: Secondary | ICD-10-CM | POA: Diagnosis not present

## 2015-03-08 DIAGNOSIS — I209 Angina pectoris, unspecified: Secondary | ICD-10-CM

## 2015-03-08 DIAGNOSIS — I10 Essential (primary) hypertension: Secondary | ICD-10-CM

## 2015-03-08 DIAGNOSIS — I2510 Atherosclerotic heart disease of native coronary artery without angina pectoris: Secondary | ICD-10-CM

## 2015-03-08 DIAGNOSIS — Z01818 Encounter for other preprocedural examination: Secondary | ICD-10-CM | POA: Diagnosis not present

## 2015-03-08 DIAGNOSIS — I2119 ST elevation (STEMI) myocardial infarction involving other coronary artery of inferior wall: Secondary | ICD-10-CM | POA: Diagnosis not present

## 2015-03-08 DIAGNOSIS — Z9861 Coronary angioplasty status: Secondary | ICD-10-CM

## 2015-03-08 MED ORDER — LISINOPRIL 20 MG PO TABS: 20.0000 mg | ORAL_TABLET | Freq: Every day | ORAL | Status: DC

## 2015-03-08 MED ORDER — AMLODIPINE BESYLATE 5 MG PO TABS: 5.0000 mg | ORAL_TABLET | Freq: Every day | ORAL | Status: DC

## 2015-03-08 NOTE — Progress Notes (Signed)
PCP: Kristie Cowman, MD  Clinic Note: Chief Complaint  Patient presents with  . Follow-up    surgery clearance  . Chest Pain    pt states no chest pain  . Shortness of Breath    no SOB, no light headedness or dizziness  . Edema    no edema    HPI: Brent LEVENE is a 59 y.o. male with a PMH below who presents today for 6 month f/u of CAD/STEMI=> PCI.  1. Inferior STEMI  March 2013 - PCI to RCA 2. Crescendo class III angina in July 2015 -- abnormal Myoview. --> Cardiac Cath => severe mid LAD disease treated with DES stent that was a new bioabsorbable polymer study stent. He also had lesions in the first diagonal and mid circumflex that were evaluated with FFR & med Rx. He was continued on Plavix  Malike L Rockwell was last seen in June 2016 - was doing well. Just not exercising much - he noted feeling tired after long work day & not feeling up to exercise.  September called re: fatigue & HTN --> lncreasd Lisinopril to 20 mg; --> added Amlodipine 2.5 mg  Recent Hospitalizations: none  Studies Reviewed: none  Interval History: radiata is actually feeling relatively well today. He denies any chest tightness or pressure with rest exertion. No PND, orthopnea or edema. No exertional dyspnea. Is still moderately exercising, is retired at the end of work. Otherwise doing quite well from a cardiac standpoint. No major complaints.he does note bruising, better on Plavix then prior to converting to Plavix. No myalgias on statin.  No palpitations, lightheadedness, dizziness, weakness or syncope/near syncope. No TIA/amaurosis fugax symptoms. No claudication.  One major reason for his visit today is to discuss preoperative risk assessment for a possible abdominal hernia surgery.  ROS: A comprehensive was performed. Review of Systems  Constitutional: Negative for malaise/fatigue (Tired at the end of a long day at work).  Respiratory: Negative.   Cardiovascular: Negative.        Per  history of present illness  Gastrointestinal: Negative for blood in stool and melena.  Genitourinary: Negative for hematuria.  Musculoskeletal: Negative for myalgias and joint pain.  Neurological: Negative.  Negative for weakness.  Psychiatric/Behavioral:       Lack of motivation. Increased stress.    Past Medical History  Diagnosis Date  . ST elevation myocardial infarction (STEMI) involving right coronary artery in recovery phase (HCC) 06/2011    100% occluded RCA; PCI - 3.0x24 Promus DES (3.5-3.6 mm) ; Modwith moderate LAD & Cx disease, EF 55-60% w/ basal inferoseptal HK confirmed by echo  . CAD S/P percutaneous coronary angioplasty 06/2011, 09/2013    (Therapeutic P2Y12 on Plavix);; a) STEMI -mid RCA DES PCI; 2015: Class III angina (abnormal stress test with anterior- lateral ischemia -> mid-distal LAD 95-99% (PCI: Biotronik AG 2 DES 2.5 x 13, 2.75 mm), D1 proximal 50-60% (FFR 0.6) with 60-70% in small branch (med management), mid circumflex 50-60% (FFR 1.0)   . Abnormal nuclear stress test 10/21/2013    Intermediate Risk - septal, anterolateral ischemia --> cath with LAD, diagonal and circumflex disease.   Marland Kitchen Dyslipidemia, goal LDL below 70   . Essential hypertension   . Anxiety   . BPH (benign prostatic hyperplasia)   . GERD (gastroesophageal reflux disease)   . Arthritis     "mostly in my hands; sometimes other areas" (10/20/2013)  . RA (rheumatoid arthritis) (HCC)   . Depression   . Bipolar disorder (  HCC)   . Rheumatic fever 1969  . Dizziness of unknown cause January 2011    abn MRI, Neuro w/u Jan 2011    Past Surgical History  Procedure Laterality Date  . Shoulder arthroscopy w/ rotator cuff repair Left July 2011  . Percutaneous coronary stent intervention (pci-s)  06/2011; 09/2013    a) 3/'13: 100%  RCA ->PCI 3.20x2 (3.6) Promus DES stent; 7/'15: mLAD PCI Biotronik AG DES 2.5 x13 2.75 mm), FFR D1 50-60% = 0.86 (small branch had ~70%), mCx ~50-60% FFR=1.0.  . Plantar fascia  surgery Left 2004    "& neuroma"  . Transthoracic echocardiogram  March 2013    EF 55%-60% with mild basilar inferoseptal hypokinesis also noted on LV-gram  . Left heart catheterization with coronary angiogram N/A 06/28/2011    Procedure: LEFT HEART CATHETERIZATION WITH CORONARY ANGIOGRAM;  Surgeon: Marykay Lex, MD;  Location: [facility] CATH LAB;  Service: Cardiovascular;  Laterality: N/A;  . Left heart catheterization with coronary angiogram N/A 10/20/2013    Procedure: LEFT HEART CATHETERIZATION WITH CORONARY ANGIOGRAM;  Surgeon: Marykay Lex, MD;  Location: [facility] CATH LAB;  Service: Cardiovascular;  Laterality: N/A;  . Nm myoview ltd  10/19/2013    INTERMEDIATE RISK, septal, anterior-anterolateral ischemia --> referred for cath the following day    Prior to Admission medications   Medication Sig Start Date End Date Taking? Authorizing Provider  amLODipine (NORVASC) 2.5 MG tablet Take 1 tablet (2.5 mg total) by mouth daily. 12/28/14  Yes Marykay Lex, MD  atorvastatin (LIPITOR) 40 MG tablet Take 1 tablet (40 mg total) by mouth daily. 09/21/14  Yes Marykay Lex, MD  clonazePAM (KLONOPIN) 1 MG tablet Take 1 mg by mouth 2 (two) times daily as needed for anxiety. Sleep/anixety   Yes Historical Provider, MD  clopidogrel (PLAVIX) 75 MG tablet TAKE 1 TABLET BY MOUTH DAILY 07/15/14  Yes Marykay Lex, MD  finasteride (PROSCAR) 5 MG tablet Take 5 mg by mouth daily.   Yes Historical Provider, MD  fluticasone (FLONASE) 50 MCG/ACT nasal spray Place 1 spray into the nose daily as needed. For allergies   Yes Historical Provider, MD  lisinopril (PRINIVIL,ZESTRIL) 20 MG tablet Take 20 mg by mouth daily.   Yes Historical Provider, MD  nitroGLYCERIN (NITROSTAT) 0.4 MG SL tablet Place 1 tablet (0.4 mg total) under the tongue every 5 (five) minutes as needed for chest pain. 10/21/13  Yes Leone Brand, NP  PARoxetine (PAXIL) 10 MG tablet Take 15 mg by mouth daily.    Yes Historical Provider, MD   No Known  Allergies   Social History   Social History  . Marital Status: Married    Spouse Name: N/A  . Number of Children: 2  . 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 Smoking status: Never Smoker   . Smokeless tobacco: Never Used  . Alcohol Use: No  . Drug Use: No  . Sexual Activity: Not Currently   Other Topics Concern  . None   Social History Narrative   Married father of 2. Never smoked. Does not drink alcohol significantly.   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   He does play golf on a fairly regular basis, but is currently not walking between holes, but riding. He is starting to play  with a new partner who likes to walk, so he is hoping to get more walking.   Family History  Problem Relation Age of Onset  . Cancer - Colon Mother 74  . Heart attack Father 75    open heart surgery/valve replaced  . Heart failure Maternal Grandmother 91  . Cancer Paternal Grandmother 12  . Heart failure Paternal Grandfather 71     Wt Readings from Last 3 Encounters:  03/08/15 197 lb (89.359 kg)  09/21/14 193 lb 11.2 oz (87.862 kg)  04/15/14 196 lb 9.6 oz (89.177 kg)    PHYSICAL EXAM BP 142/88 mmHg  Pulse 57  Ht 5\' 10"  (1.778 m)  Wt 197 lb (89.359 kg)  BMI 28.27 kg/m2 General appearance: alert, cooperative, appears stated age, no distress and Healthy-appearing, well-nourished/well-groomed. Present mood and affect Neck: no adenopathy, no carotid bruit, no JVD and supple, symmetrical, trachea midline Lungs: CTA B, normal percussion bilaterally and Nonlabored, good air movement Heart: RRR, S1, S2 normal, no murmur, click, rub or gallop and normal apical impulse Abdomen: soft, non-tender; bowel sounds normal; no masses, no organomegaly Extremities: extremities normal, atraumatic, no cyanosis or edema, no edema, redness or  tenderness in the calves or thighs and no ulcers, gangrene or trophic changes Pulses: 2+ and symmetric Neurologic: A&OX 3, normal strength and tone. Normal symmetric reflexes. Normal coordination and gait   Adult ECG Report  Rate: 57 ;  Rhythm: sinus bradycardia and Fist Deg AVB, Borderline LVH;   Narrative Interpretation: Stable EKG - Less prominent Inferior Q waves   Other studies Reviewed: Additional studies/ records that were reviewed today include:  Recent Labs:   Lab Results  Component Value Date   CHOL 205* 09/13/2014   HDL 50 09/13/2014   LDLCALC 122* 09/13/2014   TRIG 164* 09/13/2014   CHOLHDL 5.8 06/29/2011     ASSESSMENT / PLAN: Problem List Items Addressed This Visit    Presence of drug coated stent in LAD coronary artery: Biotronik AG DES 2.5 mm x 13 mm (2.75 mm) (Chronic)   Presence of DESin RCA - Promus DES 3.0 mm x 24 mm (post-dilated to 3.37mm) (Chronic)   Relevant Orders   EKG 12-Lead (Completed)   Lipid panel   Comprehensive metabolic panel   Pre-operative clearance    Asymptomatic from a cardiac standpoint with no angina or CHF. No prior stroke. Nondiabetic. Surgery he is intraperitoneal, but likely to be laparoscopic. Would be considered intermediate risk.  Based upon ACC/AHA guidelines, I would recommend proceeding to the OR with an additional evaluation. He is asymptomatic from cardiac standpoint. Revised Cardiac Risk Index indicates low risk patient.  See below He is over a year status post PCI - okay to stop Plavix 5-7 days preoperatively. Restart once safe in the postop period.  PREOPERATIVE CARDIAC RISK ASSESSMENT   Revised Cardiac Risk Index:  High Risk Surgery: no;   Defined as Intraperitoneal, intrathoracic or suprainguinal vascular  Active CAD: no; n  CHF: no;   Cerebrovascular Disease: no;   Diabetes: no; On Insulin: no  CKD (Cr >~ 2): no;   Total: 0 Estimated Risk of Adverse Outcome: LOW-risk  Estimated Risk of MI, PE, VF/VT  (Cardiac Arrest), Complete Heart Block: <1 %   ACC/AHA Guidelines for "Clearance":  Step 1 - Need for Emergency Surgery: No: elective  If Yes - go straight to OR with perioperative surveillance  Step 2 - Active Cardiac Conditions (Unstable Angina, Decompensated HF, Significant  Arrhytmias - Complete HB, Mobitz II, Symptomatic  VT or SVT, Severe Aortic Stenosis - mean gradient > 40 mmHg, Valve area < 1.0 cm2):   No:   If Yes - Evaluate & Treat per ACC/AHA Guidelines  Step 3 -  Low Risk Surgery: Yes  If Yes --> proceed to OR  If No --> Step 4  Step 4 - Functional Capacity >= 4 METS without symptoms: Yes  If Yes --> proceed to OR  If No --> Step 5  Step 5 --  Clinical Risk Factors (CRF)   No CRFs: Yes  If Yes --> Proceed to OR        Relevant Orders   EKG 12-Lead (Completed)   Lipid panel   Comprehensive metabolic panel   History of ST elevation myocardial infarction (STEMI) of inferior wall (Chronic)    Thankfully, no evidence of full thickness infarction on Myoview with preserved EF. No recurrent anal symptoms since followup PCI to the LAD.      Relevant Medications   lisinopril (PRINIVIL,ZESTRIL) 20 MG tablet   amLODipine (NORVASC) 5 MG tablet   Other Relevant Orders   EKG 12-Lead (Completed)   Lipid panel   Comprehensive metabolic panel   Essential hypertension (Chronic)    Not as well-controlled as I would like. We had started him back on amlodipine during the interim from last seen him. I will increase to 5 mg from 2.5 today.      Relevant Medications   lisinopril (PRINIVIL,ZESTRIL) 20 MG tablet   amLODipine (NORVASC) 5 MG tablet   Dyslipidemia, goal LDL below 70 (Chronic)    Most recent labs were based on reduced dose of statin. Was increased to 40 mg. He is due to have rechecked labs ordered. This can be added on to his preop labs.      Relevant Medications   lisinopril (PRINIVIL,ZESTRIL) 20 MG tablet   amLODipine (NORVASC) 5 MG tablet   Other  Relevant Orders   Lipid panel   Comprehensive metabolic panel   CAD S/P percutaneous coronary angioplasty; therapeutic P2Y12 for Plavix - Primary (Chronic)    Status post PCI of both RCA and LAD. On Plavix alone. Takes statin, calcium blocker, and ACE inhibitor. No beta blocker secondary to fatigue and depression. No active anginal symptoms.      Relevant Medications   lisinopril (PRINIVIL,ZESTRIL) 20 MG tablet   amLODipine (NORVASC) 5 MG tablet   Other Relevant Orders   EKG 12-Lead (Completed)   Lipid panel   Comprehensive metabolic panel   Angina, class II - decreased exercise tolerance, dyspnea & fatigue    Related to LAD lesion. Result ectopy stat LAD. No further angina.      Relevant Medications   lisinopril (PRINIVIL,ZESTRIL) 20 MG tablet   amLODipine (NORVASC) 5 MG tablet    Other Visit Diagnoses    Drug therapy        Relevant Orders    Lipid panel    Comprehensive metabolic panel       Current medicines are reviewed at length with the patient today. (+/- concerns) none The following changes have been made:   INCREASE TO 5 MG AMLODIPINE  Continue with LINSINOPRIL 20 MG  DAILY  LABS LIPID,CMP - NO EATING OR DRINKING THE MORNING OF THE TEST  YOU ARE CLEARED FOR SURGERY  Your physician wants you to follow-up in  6 MONTHS DR HARDING - 30 MIN  Studies Ordered:   Orders Placed This Encounter  Procedures  . Lipid panel  . Comprehensive metabolic panel  .  EKG 12-Lead      Marykay Lex, M.D., M.S. Interventional Cardiologist   Pager # [phone]

## 2015-03-08 NOTE — Patient Instructions (Signed)
INCREASE TO 5 MG AMLODIPINE  LINSINOPRIL 20 MG  DAILY  LABS LIPID,CMP - NO EATING OR DRINKING THE MORNING OF THE TEST  YOU ARE CLEARED FOR SURGERY  Your physician wants you to follow-up in  6 MONTHS DR HARDING - 30 MIN You will receive a reminder letter in the mail two months in advance. If you don't receive a letter, please call our office to schedule the follow-up appointment.  If you need a refill on your cardiac medications before your next appointment, please call your pharmacy.

## 2015-03-10 ENCOUNTER — Encounter: Payer: Self-pay | Admitting: Cardiology

## 2015-03-10 DIAGNOSIS — Z01810 Encounter for preprocedural cardiovascular examination: Secondary | ICD-10-CM | POA: Insufficient documentation

## 2015-03-10 NOTE — Assessment & Plan Note (Signed)
Status post PCI of both RCA and LAD. On Plavix alone. Takes statin, calcium blocker, and ACE inhibitor. No beta blocker secondary to fatigue and depression. No active anginal symptoms.

## 2015-03-10 NOTE — Assessment & Plan Note (Signed)
Most recent labs were based on reduced dose of statin. Was increased to 40 mg. He is due to have rechecked labs ordered. This can be added on to his preop labs.

## 2015-03-10 NOTE — Assessment & Plan Note (Signed)
Asymptomatic from a cardiac standpoint with no angina or CHF. No prior stroke. Nondiabetic. Surgery he is intraperitoneal, but likely to be laparoscopic. Would be considered intermediate risk.  Based upon ACC/AHA guidelines, I would recommend proceeding to the OR with an additional evaluation. He is asymptomatic from cardiac standpoint. Revised Cardiac Risk Index indicates low risk patient.  See below He is over a year status post PCI - okay to stop Plavix 5-7 days preoperatively. Restart once safe in the postop period.  PREOPERATIVE CARDIAC RISK ASSESSMENT   Revised Cardiac Risk Index:  High Risk Surgery: no;   Defined as Intraperitoneal, intrathoracic or suprainguinal vascular  Active CAD: no; n  CHF: no;   Cerebrovascular Disease: no;   Diabetes: no; On Insulin: no  CKD (Cr >~ 2): no;   Total: 0 Estimated Risk of Adverse Outcome: LOW-risk  Estimated Risk of MI, PE, VF/VT (Cardiac Arrest), Complete Heart Block: <1 %   ACC/AHA Guidelines for "Clearance":  Step 1 - Need for Emergency Surgery: No: elective  If Yes - go straight to OR with perioperative surveillance  Step 2 - Active Cardiac Conditions (Unstable Angina, Decompensated HF, Significant  Arrhytmias - Complete HB, Mobitz II, Symptomatic VT or SVT, Severe Aortic Stenosis - mean gradient > 40 mmHg, Valve area < 1.0 cm2):   No:   If Yes - Evaluate & Treat per ACC/AHA Guidelines  Step 3 -  Low Risk Surgery: Yes  If Yes --> proceed to OR  If No --> Step 4  Step 4 - Functional Capacity >= 4 METS without symptoms: Yes  If Yes --> proceed to OR  If No --> Step 5  Step 5 --  Clinical Risk Factors (CRF)   No CRFs: Yes  If Yes --> Proceed to OR

## 2015-03-10 NOTE — Assessment & Plan Note (Signed)
Not as well-controlled as I would like. We had started him back on amlodipine during the interim from last seen him. I will increase to 5 mg from 2.5 today.

## 2015-03-10 NOTE — Assessment & Plan Note (Signed)
Related to LAD lesion. Result ectopy stat LAD. No further angina.

## 2015-03-10 NOTE — Assessment & Plan Note (Signed)
Thankfully, no evidence of full thickness infarction on Myoview with preserved EF. No recurrent anal symptoms since followup PCI to the LAD.

## 2015-07-26 ENCOUNTER — Other Ambulatory Visit: Payer: Self-pay | Admitting: Cardiology

## 2015-08-08 DIAGNOSIS — M25511 Pain in right shoulder: Secondary | ICD-10-CM | POA: Diagnosis not present

## 2015-08-08 DIAGNOSIS — G5761 Lesion of plantar nerve, right lower limb: Secondary | ICD-10-CM | POA: Diagnosis not present

## 2015-08-16 DIAGNOSIS — M25511 Pain in right shoulder: Secondary | ICD-10-CM | POA: Diagnosis not present

## 2015-08-24 ENCOUNTER — Telehealth: Payer: Self-pay | Admitting: *Deleted

## 2015-08-24 NOTE — Telephone Encounter (Signed)
LAST APPOINTMENT 09/21/2014  Request for surgical clearance:  1. What type of surgery is being performed? RIGHT SHOULDER SCOPE ROTATOR CUFF REPAIR  2. When is this surgery scheduled? PENDING  3. Are there any medications that need to be held prior to surgery and how long? PLAVIX  4. Name of physician performing surgery? ROBERT WAINER  5. What is your office phone and fax number?  PHONE [phone] EXT [phone] ATTN SHERRI   FAX [phone].

## 2015-08-31 NOTE — Telephone Encounter (Signed)
[name] we just need to make sure that he is still feeling fine without any chest tightness or pressure with exertion or dyspnea with exertion. As long as that is okay, we can use this preop evaluation. If not we should see him for better evaluation.  As of his last visit in Lang 2016, he was symptomatic from a cardiac standpoint with no angina or CHF. No prior stroke. Nondiabetic. Surgery is low risk.   Based upon ACC/AHA guidelines, I would recommend proceeding to the OR with an additional evaluation. He is asymptomatic from cardiac standpoint. Revised Cardiac Risk Index indicates low risk patient. See below He is over a year status post PCI - okay to stop Plavix 5-7 days preoperatively. Restart once safe in the postop period.  PREOPERATIVE CARDIAC RISK ASSESSMENT   Revised Cardiac Risk Index:  High Risk Surgery: no; low risk  Defined as Intraperitoneal, intrathoracic or suprainguinal vascular  Active CAD: no; n  CHF: no;   Cerebrovascular Disease: no;   Diabetes: no; On Insulin: no  CKD (Cr >~ 2): no;  Total: 0 Estimated Risk of Adverse Outcome: LOW-risk  Estimated Risk of MI, PE, VF/VT (Cardiac Arrest), Complete Heart Block: <1 %   ACC/AHA Guidelines for "Clearance":  Step 1 - Need for Emergency Surgery: No: elective  If Yes - go straight to OR with perioperative surveillance  Step 2 - Active Cardiac Conditions (Unstable Angina, Decompensated HF, Significant Arrhytmias - Complete HB, Mobitz II, Symptomatic VT or SVT, Severe Aortic Stenosis - mean gradient > 40 mmHg, Valve area < 1.0 cm2):   No:   If Yes - Evaluate & Treat per ACC/AHA Guidelines  Step 3 - Low Risk Surgery: Yes  If Yes --> proceed to OR  If No --> Step 4  Step 4 - Functional Capacity >= 4 METS without symptoms: Yes  If Yes --> proceed to OR  If No --> Step 5  Step 5 -- Clinical Risk Factors (CRF)   No CRFs: Yes  If Yes --> Proceed to OR

## 2015-09-01 NOTE — Telephone Encounter (Signed)
Left message on  both patient's home and cell phone to call back

## 2015-09-02 NOTE — Telephone Encounter (Signed)
Spoke to patient. Patient states he wants to have surgery later --next month or later Prefer to have regular cardiology appointment prior to clearance. Appointment scheduled 09/20/15. Patient verbalized understanding.   RN NOTIFIED DR Sherene Sires OFFICE of appointment

## 2015-09-09 DIAGNOSIS — F3181 Bipolar II disorder: Secondary | ICD-10-CM | POA: Diagnosis not present

## 2015-09-20 ENCOUNTER — Encounter: Payer: Self-pay | Admitting: Cardiology

## 2015-09-20 ENCOUNTER — Ambulatory Visit (INDEPENDENT_AMBULATORY_CARE_PROVIDER_SITE_OTHER): Payer: Federal, State, Local not specified - PPO | Admitting: Cardiology

## 2015-09-20 ENCOUNTER — Telehealth: Payer: Self-pay | Admitting: *Deleted

## 2015-09-20 VITALS — BP 138/90 | HR 64 | Ht 70.0 in | Wt 198.4 lb

## 2015-09-20 DIAGNOSIS — I2510 Atherosclerotic heart disease of native coronary artery without angina pectoris: Secondary | ICD-10-CM | POA: Diagnosis not present

## 2015-09-20 DIAGNOSIS — I10 Essential (primary) hypertension: Secondary | ICD-10-CM | POA: Diagnosis not present

## 2015-09-20 DIAGNOSIS — Z79899 Other long term (current) drug therapy: Secondary | ICD-10-CM

## 2015-09-20 DIAGNOSIS — Z01810 Encounter for preprocedural cardiovascular examination: Secondary | ICD-10-CM

## 2015-09-20 DIAGNOSIS — I2119 ST elevation (STEMI) myocardial infarction involving other coronary artery of inferior wall: Secondary | ICD-10-CM

## 2015-09-20 DIAGNOSIS — Z9861 Coronary angioplasty status: Secondary | ICD-10-CM

## 2015-09-20 DIAGNOSIS — E785 Hyperlipidemia, unspecified: Secondary | ICD-10-CM | POA: Diagnosis not present

## 2015-09-20 NOTE — Telephone Encounter (Signed)
OPEN ERROR

## 2015-09-20 NOTE — Telephone Encounter (Signed)
PATIENT HAD OFFICE VISIT  09/20/15 WITH DR HARDING  PER DR HARDING,  CONTINUE WITH PREVIOUS CARDIAC CLEARANCE   PATIENT MAY HOLD PLAVIX 5 DAYS PRIOR TO SURGERY.  FOLLOW THE BELOW  INSTRUCTIONS  ROUTED  THE COMPLETE  MESSAGE  FROM 08/24/15 UNTIL 6/20 17 TO ORTHOPAEDIC'S OFFICE

## 2015-09-20 NOTE — Telephone Encounter (Signed)
PATIENT HAD AN OFFICE VISIT 09/20/15 PER DR HARDING , PATIENT HAS CARDIAC CLEARANCE FOR  UPCOMING SURGERY WITH DR Thurston Hole.  FOLLOW THE BELOW CARDIAC CLEARANCE INSTRUCTION  ROUTED TO DR [facility] OFFICE

## 2015-09-20 NOTE — Patient Instructions (Addendum)
OKAY  FOR SURGERY - MAY  HOLD PLAVIX  FOR 5 DAYS PRIOR TO SHOULDER SURGERY  LABS-CMP ,LIPID- FASTING    Your physician wants you to follow-up in:12 MONTHS WITH DR Herbie Baltimore  You will receive a reminder letter in the mail two months in advance. If you don't receive a letter, please call our office to schedule the follow-up appointment.   If you need a refill on your cardiac medications before your next appointment, please call your pharmacy.

## 2015-09-20 NOTE — Progress Notes (Signed)
PCP: Kristie Cowman, MD  Clinic Note: Chief Complaint  Patient presents with  . Follow-up    Pt states no Sx.  . Coronary Artery Disease    History of STEMI  . Pre-op Exam    HPI: [name] is a 60 y.o. male with a PMH below who presents today for Six-month follow-up for CAD/STEMI=> PCI.  1. Inferior STEMI March 2013 - PCI to RCA 2. Crescendo class III angina in July 2015 -- abnormal Myoview. --> Cardiac Cath => severe mid LAD disease treated with DES stent that was a new bioabsorbable polymer study stent. He also had lesions in the first diagonal and mid circumflex that were evaluated with FFR & med Rx. He was continued on Plavix  [name] was last seen on 03/10/2015. Back in June 2016, we increased lisinopril 20 and added amlodipine 2.5 mg. He mostly notes feeling fatigued at the end of a long day at work. He notes lack of motivation and increased work stress. I did a preoperative evaluation for surgery for the repair.  Retired from [employer] end of April.  Wife retired the end of this month.   Recent Hospitalizations: December 30th Hernia repair. - went well.   Studies Reviewed: NoneHistory of STEMI  Interval History: [name] presents today for routine follow-up without any cardiac symptoms. He retired 2 months ago and is now much more relaxed and under less stress. He notes less concerning symptoms of twinges in his chest or palpitations. He has not really had any untoward symptoms. No PND orthopnea or edema. No anginal pain with rest or exertion. No resting or exertional dyspnea.   No palpitations, lightheadedness, dizziness, weakness or syncope/near syncope. No TIA/amaurosis fugax symptoms. No melena, hematochezia, hematuria, or epstaxis. No claudication.  ROS: A comprehensive was performed. Review of Systems  Constitutional: Positive for malaise/fatigue (Trying to motivate himself to get back to exercise. But still wasn't feeling his energy level is  back to where should be. Not as it was preop.).  HENT: Positive for nosebleeds.   Respiratory: Negative for cough, shortness of breath and wheezing.   Cardiovascular: Negative for claudication.       Per history of present illness  Gastrointestinal: Negative for blood in stool and melena.  Genitourinary: Negative for dysuria and hematuria.  Musculoskeletal: Positive for joint pain (Mild arthritis pains; worst symptom however though is right shoulder pain that is very significant. He is in the process of being evaluated for possible right shoulder surgery.). Negative for myalgias.  Skin: Negative for rash.  Neurological: Negative for dizziness, focal weakness and headaches.  Psychiatric/Behavioral: Negative for depression and memory loss. The patient is not nervous/anxious and does not have insomnia.        Notably improved anxiety and depression type symptoms. This is all stemming from the fact he just retired.  All other systems reviewed and are negative.   Past Medical History  Diagnosis Date  . ST elevation myocardial infarction (STEMI) involving right coronary artery in recovery phase (HCC) 06/2011    100% occluded RCA; PCI - 3.0x24 Promus DES (3.5-3.6 mm) ; Modwith moderate LAD & Cx disease, EF 55-60% w/ basal inferoseptal HK confirmed by echo  . CAD S/P percutaneous coronary angioplasty 06/2011, 09/2013    (Therapeutic P2Y12 on Plavix);; a) STEMI -mid RCA DES PCI; 2015: Class III angina (abnormal stress test with anterior- lateral ischemia -> mid-distal LAD 95-99% (PCI: Biotronik AG 2 DES 2.5 x 13, 2.75 mm), D1 proximal 50-60% (  FFR 0.6) with 60-70% in small branch (med management), mid circumflex 50-60% (FFR 1.0)   . Abnormal nuclear stress test 10/21/2013    Intermediate Risk - septal, anterolateral ischemia --> cath with LAD, diagonal and circumflex disease.   Marland Kitchen Dyslipidemia, goal LDL below 70   . Essential hypertension   . Anxiety   . BPH (benign prostatic hyperplasia)   . GERD  (gastroesophageal reflux disease)   . Arthritis     "mostly in my hands; sometimes other areas" (10/20/2013)  . RA (rheumatoid arthritis) (HCC)   . Depression   . Bipolar disorder (HCC)   . Rheumatic fever 1969  . Dizziness of unknown cause January 2011    abn MRI, Neuro w/u Jan 2011    Past Surgical History  Procedure Laterality Date  . Shoulder arthroscopy w/ rotator cuff repair Left July 2011  . Percutaneous coronary stent intervention (pci-s)  06/2011; 09/2013    a) 3/'13: 100%  RCA ->PCI 3.20x2 (3.6) Promus DES stent; 7/'15: mLAD PCI Biotronik AG DES 2.5 x13 2.75 mm), FFR D1 50-60% = 0.86 (small branch had ~70%), mCx ~50-60% FFR=1.0.  . Plantar fascia surgery Left 2004    "& neuroma"  . Transthoracic echocardiogram  March 2013    EF 55%-60% with mild basilar inferoseptal hypokinesis also noted on LV-gram  . Left heart catheterization with coronary angiogram N/A 06/28/2011    Procedure: LEFT HEART CATHETERIZATION WITH CORONARY ANGIOGRAM;  Surgeon: Marykay Lex, MD;  Location[facility] CATH LAB;  Service: Cardiovascular;  Laterality: N/A;  . Left heart catheterization with coronary angiogram N/A 10/20/2013    Procedure: LEFT HEART CATHETERIZATION WITH CORONARY ANGIOGRAM;  Surgeon: Marykay Lex, MD;  Location: [facility] CATH LAB;  Service: Cardiovascular;  Laterality: N/A;  . Nm myoview ltd  10/19/2013    INTERMEDIATE RISK, septal, anterior-anterolateral ischemia --> referred for cath the following day   Prior to Admission medications   Medication Sig Start Date End Date Taking? Authorizing Provider  amLODipine (NORVASC) 5 MG tablet Take 1 tablet (5 mg total) by mouth daily. 03/08/15  Yes Marykay Lex, MD  atorvastatin (LIPITOR) 40 MG tablet Take 1 tablet (40 mg total) by mouth daily. 09/21/14  Yes Marykay Lex, MD  clonazePAM (KLONOPIN) 1 MG tablet Take 1.5 mg by mouth daily. Sleep/anixety   Yes Historical Provider, MD  clopidogrel (PLAVIX) 75 MG tablet TAKE 1 TABLET BY MOUTH DAILY 07/26/15   Yes Marykay Lex, MD  finasteride (PROSCAR) 5 MG tablet Take 5 mg by mouth daily.   Yes Historical Provider, MD  fluticasone (FLONASE) 50 MCG/ACT nasal spray Place 1 spray into the nose daily as needed. For allergies   Yes Historical Provider, MD  lisinopril (PRINIVIL,ZESTRIL) 20 MG tablet Take 1 tablet (20 mg total) by mouth daily. 03/08/15  Yes Marykay Lex, MD  nitroGLYCERIN (NITROSTAT) 0.4 MG SL tablet Place 1 tablet (0.4 mg total) under the tongue every 5 (five) minutes as needed for chest pain. 10/21/13  Yes Leone Brand, NP  PARoxetine (PAXIL) 10 MG tablet Take 15 mg by mouth daily.    Yes Historical Provider, MD   No Known Allergies   Social History   Social History  . Marital Status: Married    Spouse Name: N/A  . Number of Children: 2  . 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 Smoking status: Never Smoker   . Smokeless tobacco: Never  Used  . Alcohol Use: No  . Drug Use: No  . Sexual Activity: Not Currently   Other Topics Concern  . None   Social History Narrative   Married father of 2. Never smoked. Does not drink alcohol significantly.   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   He does play golf on a fairly regular basis, but is currently not walking between holes, but riding. He is starting to play with a new partner who likes to walk, so he is hoping to get more walking.   Family History  Problem Relation Age of Onset  . Cancer - Colon Mother 70  . Heart attack Father 75    open heart surgery/valve replaced  . Heart failure Maternal Grandmother 91  . Cancer Paternal Grandmother 9  . Heart failure Paternal Grandfather 71     Wt Readings from Last 3 Encounters:  09/20/15 198 lb 6.4 oz (89.994 kg)  03/08/15 197 lb (89.359 kg)  09/21/14 193 lb 11.2 oz (87.862 kg)    PHYSICAL EXAM BP 138/90  mmHg  Pulse 64  Ht 5\' 10"  (1.778 m)  Wt 198 lb 6.4 oz (89.994 kg)  BMI 28.47 kg/m2 General appearance: alert, cooperative, appears stated age, no distress and Healthy-appearing, well-nourished/well-groomed. Present mood and affect Neck: no adenopathy, no carotid bruit, no JVD and supple, symmetrical, trachea midline Lungs: CTA B, normal percussion bilaterally and Nonlabored, good air movement Heart: RRR, S1, S2 normal, no murmur, click, rub or gallop and normal apical impulse Abdomen: soft, non-tender; bowel sounds normal; no masses, no organomegaly Extremities: extremities normal, atraumatic, no cyanosis or edema, no edema, redness or tenderness in the calves or thighs and no ulcers, gangrene or trophic changes Pulses: 2+ and symmetric Neurologic: A&OX 3, normal strength and tone. Normal symmetric reflexes. Normal coordination and gait    Adult ECG Report  Rate: 64 ;  Rhythm: normal sinus rhythm and 1 AVB; otherwise normal axis, voltage and durations. PR normal is 228  Narrative Interpretation: Stable EKG   Other studies Reviewed: Additional studies/ records that were reviewed today include:  Recent Labs:    Lab Results  Component Value Date   CHOL 205* 09/13/2014   HDL 50 09/13/2014   LDLCALC 122* 09/13/2014   TRIG 164* 09/13/2014   CHOLHDL 5.8 06/29/2011    ASSESSMENT / PLAN: Problem List Items Addressed This Visit    Preoperative cardiovascular examination    Asymptomatic with no angina or heart failure. No prior stroke. Nondiabetic. Low risk surgery - orthopedic.  Preoperative cardiac risk assessment by cardiac arrest the risk index would suggest LOW RISK patient for low risk surgery. Risk of ever cardiac event less than 1%. By ACC/AHA guidelines for clearance: Elective surgery. No active symptoms. Based on this recommendation would proceed to low risk surgery without further evaluation. Regardless, he has no symptoms with >4 METs. Recommendation would be to proceed to  the OR without any further cardiac evaluation. We will be available if there happens to be any complications.      History of ST elevation myocardial infarction (STEMI) of inferior wall (Chronic)    He had an occluded RCA treated with a single DES stent. Thankfully no full-thickness infarction noted on Myoview. He has preserved EF with no heart failure symptoms. No recurrent anginal symptoms since his follow-up PCI to the LAD.  Essential hypertension - Primary (Chronic)    Borderline control, but much better having increased his amlodipine dose to 5. Would need to monitor closely and potentially increase at follow-up visit.      Relevant Orders   EKG 12-Lead (Completed)   Lipid panel   Comprehensive metabolic panel   Dyslipidemia, goal LDL below 70 (Chronic)    We increased his statin dose back to 40 mg. We have not had lipids checked since. Will reorder lipid panel to reassess on higher dose of statin.      Relevant Orders   EKG 12-Lead (Completed)   Lipid panel   Comprehensive metabolic panel   CAD S/P percutaneous coronary angioplasty; therapeutic P2Y12 for Plavix (Chronic)    On stable regimen without any recurrent anginal symptoms He is on Plavix, statin, ACE inhibitor. He is not on beta blocker because of fatigue/depression and bradycardia in the past. He is on Norvasc. Stents to both the RCA and PCI. On Plavix without aspirin.      Relevant Orders   EKG 12-Lead (Completed)   Lipid panel   Comprehensive metabolic panel    Other Visit Diagnoses    Pre-operative cardiovascular examination        Relevant Orders    EKG 12-Lead (Completed)    Lipid panel    Comprehensive metabolic panel    Drug therapy           Current medicines are reviewed at length with the patient today. (+/- concerns) When to stop Plavix for surgery. The following changes have been made:   OKAY  FOR SURGERY - MAY  HOLD PLAVIX  FOR 5 DAYS PRIOR TO SHOULDER SURGERY  LABS-CMP ,LIPID-  FASTING    Your physician wants you to follow-up in:12 MONTHS WITH DR Herbie Baltimore    Studies Ordered:   Orders Placed This Encounter  Procedures  . Lipid panel  . Comprehensive metabolic panel  . EKG 12-Lead      [name], M.D., M.S. Interventional Cardiologist   Pager # [phone] Phone # [phone] [address] [place], [place] [address]

## 2015-09-21 ENCOUNTER — Encounter: Payer: Self-pay | Admitting: Cardiology

## 2015-09-21 NOTE — Assessment & Plan Note (Signed)
Borderline control, but much better having increased his amlodipine dose to 5. Would need to monitor closely and potentially increase at follow-up visit.

## 2015-09-21 NOTE — Assessment & Plan Note (Signed)
We increased his statin dose back to 40 mg. We have not had lipids checked since. Will reorder lipid panel to reassess on higher dose of statin.

## 2015-09-21 NOTE — Assessment & Plan Note (Addendum)
On stable regimen without any recurrent anginal symptoms He is on Plavix, statin, ACE inhibitor. He is not on beta blocker because of fatigue/depression and bradycardia in the past. He is on Norvasc. Stents to both the RCA and PCI. On Plavix without aspirin.

## 2015-09-21 NOTE — Assessment & Plan Note (Signed)
He had an occluded RCA treated with a single DES stent. Thankfully no full-thickness infarction noted on Myoview. He has preserved EF with no heart failure symptoms. No recurrent anginal symptoms since his follow-up PCI to the LAD.

## 2015-09-21 NOTE — Assessment & Plan Note (Signed)
Asymptomatic with no angina or heart failure. No prior stroke. Nondiabetic. Low risk surgery - orthopedic.  Preoperative cardiac risk assessment by cardiac arrest the risk index would suggest LOW RISK patient for low risk surgery. Risk of ever cardiac event less than 1%. By ACC/AHA guidelines for clearance: Elective surgery. No active symptoms. Based on this recommendation would proceed to low risk surgery without further evaluation. Regardless, he has no symptoms with >4 METs. Recommendation would be to proceed to the OR without any further cardiac evaluation. We will be available if there happens to be any complications.

## 2015-10-06 ENCOUNTER — Other Ambulatory Visit: Payer: Self-pay | Admitting: Cardiology

## 2015-10-06 NOTE — Telephone Encounter (Signed)
Rx(s) sent to pharmacy electronically.  

## 2015-10-31 DIAGNOSIS — M25511 Pain in right shoulder: Secondary | ICD-10-CM | POA: Diagnosis not present

## 2015-11-16 DIAGNOSIS — M66811 Spontaneous rupture of other tendons, right shoulder: Secondary | ICD-10-CM | POA: Diagnosis not present

## 2015-11-16 DIAGNOSIS — M75121 Complete rotator cuff tear or rupture of right shoulder, not specified as traumatic: Secondary | ICD-10-CM | POA: Diagnosis not present

## 2015-11-16 DIAGNOSIS — M19011 Primary osteoarthritis, right shoulder: Secondary | ICD-10-CM | POA: Diagnosis not present

## 2015-11-16 DIAGNOSIS — M7551 Bursitis of right shoulder: Secondary | ICD-10-CM | POA: Diagnosis not present

## 2015-11-16 DIAGNOSIS — G8918 Other acute postprocedural pain: Secondary | ICD-10-CM | POA: Diagnosis not present

## 2015-11-16 DIAGNOSIS — M7541 Impingement syndrome of right shoulder: Secondary | ICD-10-CM | POA: Diagnosis not present

## 2015-11-16 DIAGNOSIS — M24111 Other articular cartilage disorders, right shoulder: Secondary | ICD-10-CM | POA: Diagnosis not present

## 2015-11-18 DIAGNOSIS — M75120 Complete rotator cuff tear or rupture of unspecified shoulder, not specified as traumatic: Secondary | ICD-10-CM | POA: Diagnosis not present

## 2015-11-18 DIAGNOSIS — M25611 Stiffness of right shoulder, not elsewhere classified: Secondary | ICD-10-CM | POA: Diagnosis not present

## 2015-11-18 DIAGNOSIS — M25511 Pain in right shoulder: Secondary | ICD-10-CM | POA: Diagnosis not present

## 2015-11-21 DIAGNOSIS — M25511 Pain in right shoulder: Secondary | ICD-10-CM | POA: Diagnosis not present

## 2015-11-21 DIAGNOSIS — M25611 Stiffness of right shoulder, not elsewhere classified: Secondary | ICD-10-CM | POA: Diagnosis not present

## 2015-11-21 DIAGNOSIS — M75120 Complete rotator cuff tear or rupture of unspecified shoulder, not specified as traumatic: Secondary | ICD-10-CM | POA: Diagnosis not present

## 2015-11-24 DIAGNOSIS — M19011 Primary osteoarthritis, right shoulder: Secondary | ICD-10-CM | POA: Diagnosis not present

## 2015-11-25 DIAGNOSIS — M75120 Complete rotator cuff tear or rupture of unspecified shoulder, not specified as traumatic: Secondary | ICD-10-CM | POA: Diagnosis not present

## 2015-11-25 DIAGNOSIS — M25611 Stiffness of right shoulder, not elsewhere classified: Secondary | ICD-10-CM | POA: Diagnosis not present

## 2015-11-25 DIAGNOSIS — M25511 Pain in right shoulder: Secondary | ICD-10-CM | POA: Diagnosis not present

## 2015-11-28 DIAGNOSIS — M25511 Pain in right shoulder: Secondary | ICD-10-CM | POA: Diagnosis not present

## 2015-11-28 DIAGNOSIS — M25611 Stiffness of right shoulder, not elsewhere classified: Secondary | ICD-10-CM | POA: Diagnosis not present

## 2015-11-28 DIAGNOSIS — M75120 Complete rotator cuff tear or rupture of unspecified shoulder, not specified as traumatic: Secondary | ICD-10-CM | POA: Diagnosis not present

## 2015-12-01 DIAGNOSIS — M25511 Pain in right shoulder: Secondary | ICD-10-CM | POA: Diagnosis not present

## 2015-12-01 DIAGNOSIS — M25611 Stiffness of right shoulder, not elsewhere classified: Secondary | ICD-10-CM | POA: Diagnosis not present

## 2015-12-01 DIAGNOSIS — M75120 Complete rotator cuff tear or rupture of unspecified shoulder, not specified as traumatic: Secondary | ICD-10-CM | POA: Diagnosis not present

## 2015-12-06 DIAGNOSIS — M25511 Pain in right shoulder: Secondary | ICD-10-CM | POA: Diagnosis not present

## 2015-12-06 DIAGNOSIS — M25611 Stiffness of right shoulder, not elsewhere classified: Secondary | ICD-10-CM | POA: Diagnosis not present

## 2015-12-06 DIAGNOSIS — M75120 Complete rotator cuff tear or rupture of unspecified shoulder, not specified as traumatic: Secondary | ICD-10-CM | POA: Diagnosis not present

## 2015-12-09 DIAGNOSIS — M25611 Stiffness of right shoulder, not elsewhere classified: Secondary | ICD-10-CM | POA: Diagnosis not present

## 2015-12-09 DIAGNOSIS — M75120 Complete rotator cuff tear or rupture of unspecified shoulder, not specified as traumatic: Secondary | ICD-10-CM | POA: Diagnosis not present

## 2015-12-09 DIAGNOSIS — M25511 Pain in right shoulder: Secondary | ICD-10-CM | POA: Diagnosis not present

## 2015-12-13 DIAGNOSIS — M25611 Stiffness of right shoulder, not elsewhere classified: Secondary | ICD-10-CM | POA: Diagnosis not present

## 2015-12-13 DIAGNOSIS — M25511 Pain in right shoulder: Secondary | ICD-10-CM | POA: Diagnosis not present

## 2015-12-13 DIAGNOSIS — M75120 Complete rotator cuff tear or rupture of unspecified shoulder, not specified as traumatic: Secondary | ICD-10-CM | POA: Diagnosis not present

## 2015-12-15 DIAGNOSIS — M19011 Primary osteoarthritis, right shoulder: Secondary | ICD-10-CM | POA: Diagnosis not present

## 2015-12-16 DIAGNOSIS — M25511 Pain in right shoulder: Secondary | ICD-10-CM | POA: Diagnosis not present

## 2015-12-16 DIAGNOSIS — M25611 Stiffness of right shoulder, not elsewhere classified: Secondary | ICD-10-CM | POA: Diagnosis not present

## 2015-12-16 DIAGNOSIS — M75120 Complete rotator cuff tear or rupture of unspecified shoulder, not specified as traumatic: Secondary | ICD-10-CM | POA: Diagnosis not present

## 2015-12-21 DIAGNOSIS — M25511 Pain in right shoulder: Secondary | ICD-10-CM | POA: Diagnosis not present

## 2015-12-21 DIAGNOSIS — M75120 Complete rotator cuff tear or rupture of unspecified shoulder, not specified as traumatic: Secondary | ICD-10-CM | POA: Diagnosis not present

## 2015-12-21 DIAGNOSIS — M25611 Stiffness of right shoulder, not elsewhere classified: Secondary | ICD-10-CM | POA: Diagnosis not present

## 2015-12-23 DIAGNOSIS — M25511 Pain in right shoulder: Secondary | ICD-10-CM | POA: Diagnosis not present

## 2015-12-23 DIAGNOSIS — M25611 Stiffness of right shoulder, not elsewhere classified: Secondary | ICD-10-CM | POA: Diagnosis not present

## 2015-12-23 DIAGNOSIS — M75120 Complete rotator cuff tear or rupture of unspecified shoulder, not specified as traumatic: Secondary | ICD-10-CM | POA: Diagnosis not present

## 2015-12-27 DIAGNOSIS — M25511 Pain in right shoulder: Secondary | ICD-10-CM | POA: Diagnosis not present

## 2015-12-27 DIAGNOSIS — M75120 Complete rotator cuff tear or rupture of unspecified shoulder, not specified as traumatic: Secondary | ICD-10-CM | POA: Diagnosis not present

## 2015-12-27 DIAGNOSIS — M25611 Stiffness of right shoulder, not elsewhere classified: Secondary | ICD-10-CM | POA: Diagnosis not present

## 2016-01-02 DIAGNOSIS — M75120 Complete rotator cuff tear or rupture of unspecified shoulder, not specified as traumatic: Secondary | ICD-10-CM | POA: Diagnosis not present

## 2016-01-02 DIAGNOSIS — M25511 Pain in right shoulder: Secondary | ICD-10-CM | POA: Diagnosis not present

## 2016-01-02 DIAGNOSIS — M25611 Stiffness of right shoulder, not elsewhere classified: Secondary | ICD-10-CM | POA: Diagnosis not present

## 2016-01-05 DIAGNOSIS — M25611 Stiffness of right shoulder, not elsewhere classified: Secondary | ICD-10-CM | POA: Diagnosis not present

## 2016-01-05 DIAGNOSIS — M75120 Complete rotator cuff tear or rupture of unspecified shoulder, not specified as traumatic: Secondary | ICD-10-CM | POA: Diagnosis not present

## 2016-01-05 DIAGNOSIS — M25511 Pain in right shoulder: Secondary | ICD-10-CM | POA: Diagnosis not present

## 2016-01-09 DIAGNOSIS — M25611 Stiffness of right shoulder, not elsewhere classified: Secondary | ICD-10-CM | POA: Diagnosis not present

## 2016-01-09 DIAGNOSIS — M75120 Complete rotator cuff tear or rupture of unspecified shoulder, not specified as traumatic: Secondary | ICD-10-CM | POA: Diagnosis not present

## 2016-01-09 DIAGNOSIS — M25511 Pain in right shoulder: Secondary | ICD-10-CM | POA: Diagnosis not present

## 2016-01-12 DIAGNOSIS — M19011 Primary osteoarthritis, right shoulder: Secondary | ICD-10-CM | POA: Diagnosis not present

## 2016-01-13 DIAGNOSIS — M25511 Pain in right shoulder: Secondary | ICD-10-CM | POA: Diagnosis not present

## 2016-01-13 DIAGNOSIS — M75120 Complete rotator cuff tear or rupture of unspecified shoulder, not specified as traumatic: Secondary | ICD-10-CM | POA: Diagnosis not present

## 2016-01-13 DIAGNOSIS — M25611 Stiffness of right shoulder, not elsewhere classified: Secondary | ICD-10-CM | POA: Diagnosis not present

## 2016-01-17 DIAGNOSIS — L739 Follicular disorder, unspecified: Secondary | ICD-10-CM | POA: Diagnosis not present

## 2016-01-17 DIAGNOSIS — L820 Inflamed seborrheic keratosis: Secondary | ICD-10-CM | POA: Diagnosis not present

## 2016-01-17 DIAGNOSIS — L570 Actinic keratosis: Secondary | ICD-10-CM | POA: Diagnosis not present

## 2016-01-17 DIAGNOSIS — D1801 Hemangioma of skin and subcutaneous tissue: Secondary | ICD-10-CM | POA: Diagnosis not present

## 2016-01-17 DIAGNOSIS — L821 Other seborrheic keratosis: Secondary | ICD-10-CM | POA: Diagnosis not present

## 2016-01-17 DIAGNOSIS — L309 Dermatitis, unspecified: Secondary | ICD-10-CM | POA: Diagnosis not present

## 2016-01-18 DIAGNOSIS — M75120 Complete rotator cuff tear or rupture of unspecified shoulder, not specified as traumatic: Secondary | ICD-10-CM | POA: Diagnosis not present

## 2016-01-18 DIAGNOSIS — M25611 Stiffness of right shoulder, not elsewhere classified: Secondary | ICD-10-CM | POA: Diagnosis not present

## 2016-01-18 DIAGNOSIS — F3181 Bipolar II disorder: Secondary | ICD-10-CM | POA: Diagnosis not present

## 2016-01-18 DIAGNOSIS — M25511 Pain in right shoulder: Secondary | ICD-10-CM | POA: Diagnosis not present

## 2016-01-20 DIAGNOSIS — M25511 Pain in right shoulder: Secondary | ICD-10-CM | POA: Diagnosis not present

## 2016-01-20 DIAGNOSIS — M75120 Complete rotator cuff tear or rupture of unspecified shoulder, not specified as traumatic: Secondary | ICD-10-CM | POA: Diagnosis not present

## 2016-01-20 DIAGNOSIS — M25611 Stiffness of right shoulder, not elsewhere classified: Secondary | ICD-10-CM | POA: Diagnosis not present

## 2016-01-23 DIAGNOSIS — M25511 Pain in right shoulder: Secondary | ICD-10-CM | POA: Diagnosis not present

## 2016-01-23 DIAGNOSIS — M25611 Stiffness of right shoulder, not elsewhere classified: Secondary | ICD-10-CM | POA: Diagnosis not present

## 2016-01-23 DIAGNOSIS — M75120 Complete rotator cuff tear or rupture of unspecified shoulder, not specified as traumatic: Secondary | ICD-10-CM | POA: Diagnosis not present

## 2016-01-25 DIAGNOSIS — M75120 Complete rotator cuff tear or rupture of unspecified shoulder, not specified as traumatic: Secondary | ICD-10-CM | POA: Diagnosis not present

## 2016-01-25 DIAGNOSIS — M25611 Stiffness of right shoulder, not elsewhere classified: Secondary | ICD-10-CM | POA: Diagnosis not present

## 2016-01-25 DIAGNOSIS — M25511 Pain in right shoulder: Secondary | ICD-10-CM | POA: Diagnosis not present

## 2016-01-27 DIAGNOSIS — M75120 Complete rotator cuff tear or rupture of unspecified shoulder, not specified as traumatic: Secondary | ICD-10-CM | POA: Diagnosis not present

## 2016-01-27 DIAGNOSIS — M25511 Pain in right shoulder: Secondary | ICD-10-CM | POA: Diagnosis not present

## 2016-01-27 DIAGNOSIS — M25611 Stiffness of right shoulder, not elsewhere classified: Secondary | ICD-10-CM | POA: Diagnosis not present

## 2016-01-30 DIAGNOSIS — M75120 Complete rotator cuff tear or rupture of unspecified shoulder, not specified as traumatic: Secondary | ICD-10-CM | POA: Diagnosis not present

## 2016-01-30 DIAGNOSIS — M25611 Stiffness of right shoulder, not elsewhere classified: Secondary | ICD-10-CM | POA: Diagnosis not present

## 2016-01-30 DIAGNOSIS — M25511 Pain in right shoulder: Secondary | ICD-10-CM | POA: Diagnosis not present

## 2016-02-01 DIAGNOSIS — M25511 Pain in right shoulder: Secondary | ICD-10-CM | POA: Diagnosis not present

## 2016-02-01 DIAGNOSIS — M75120 Complete rotator cuff tear or rupture of unspecified shoulder, not specified as traumatic: Secondary | ICD-10-CM | POA: Diagnosis not present

## 2016-02-01 DIAGNOSIS — M25611 Stiffness of right shoulder, not elsewhere classified: Secondary | ICD-10-CM | POA: Diagnosis not present

## 2016-02-02 DIAGNOSIS — M19011 Primary osteoarthritis, right shoulder: Secondary | ICD-10-CM | POA: Diagnosis not present

## 2016-02-03 DIAGNOSIS — M25511 Pain in right shoulder: Secondary | ICD-10-CM | POA: Diagnosis not present

## 2016-02-03 DIAGNOSIS — M75120 Complete rotator cuff tear or rupture of unspecified shoulder, not specified as traumatic: Secondary | ICD-10-CM | POA: Diagnosis not present

## 2016-02-03 DIAGNOSIS — M25611 Stiffness of right shoulder, not elsewhere classified: Secondary | ICD-10-CM | POA: Diagnosis not present

## 2016-02-08 DIAGNOSIS — M75120 Complete rotator cuff tear or rupture of unspecified shoulder, not specified as traumatic: Secondary | ICD-10-CM | POA: Diagnosis not present

## 2016-02-08 DIAGNOSIS — M25611 Stiffness of right shoulder, not elsewhere classified: Secondary | ICD-10-CM | POA: Diagnosis not present

## 2016-02-08 DIAGNOSIS — M25511 Pain in right shoulder: Secondary | ICD-10-CM | POA: Diagnosis not present

## 2016-02-10 DIAGNOSIS — M75120 Complete rotator cuff tear or rupture of unspecified shoulder, not specified as traumatic: Secondary | ICD-10-CM | POA: Diagnosis not present

## 2016-02-10 DIAGNOSIS — M25611 Stiffness of right shoulder, not elsewhere classified: Secondary | ICD-10-CM | POA: Diagnosis not present

## 2016-02-10 DIAGNOSIS — M25511 Pain in right shoulder: Secondary | ICD-10-CM | POA: Diagnosis not present

## 2016-02-14 DIAGNOSIS — M25611 Stiffness of right shoulder, not elsewhere classified: Secondary | ICD-10-CM | POA: Diagnosis not present

## 2016-02-14 DIAGNOSIS — Z23 Encounter for immunization: Secondary | ICD-10-CM | POA: Diagnosis not present

## 2016-02-14 DIAGNOSIS — M75120 Complete rotator cuff tear or rupture of unspecified shoulder, not specified as traumatic: Secondary | ICD-10-CM | POA: Diagnosis not present

## 2016-02-14 DIAGNOSIS — M25511 Pain in right shoulder: Secondary | ICD-10-CM | POA: Diagnosis not present

## 2016-02-15 DIAGNOSIS — N400 Enlarged prostate without lower urinary tract symptoms: Secondary | ICD-10-CM | POA: Diagnosis not present

## 2016-02-16 DIAGNOSIS — M25611 Stiffness of right shoulder, not elsewhere classified: Secondary | ICD-10-CM | POA: Diagnosis not present

## 2016-02-16 DIAGNOSIS — M25511 Pain in right shoulder: Secondary | ICD-10-CM | POA: Diagnosis not present

## 2016-02-16 DIAGNOSIS — M75120 Complete rotator cuff tear or rupture of unspecified shoulder, not specified as traumatic: Secondary | ICD-10-CM | POA: Diagnosis not present

## 2016-02-19 ENCOUNTER — Emergency Department (HOSPITAL_COMMUNITY): Payer: Federal, State, Local not specified - PPO

## 2016-02-19 ENCOUNTER — Emergency Department (HOSPITAL_COMMUNITY)
Admission: EM | Admit: 2016-02-19 | Discharge: 2016-02-19 | Disposition: A | Discharge status: Home / Self Care | Payer: Federal, State, Local not specified - PPO | Attending: Emergency Medicine | Admitting: Emergency Medicine

## 2016-02-19 ENCOUNTER — Encounter (HOSPITAL_COMMUNITY): Payer: Self-pay | Admitting: Emergency Medicine

## 2016-02-19 DIAGNOSIS — I2510 Atherosclerotic heart disease of native coronary artery without angina pectoris: Secondary | ICD-10-CM | POA: Insufficient documentation

## 2016-02-19 DIAGNOSIS — I252 Old myocardial infarction: Secondary | ICD-10-CM | POA: Insufficient documentation

## 2016-02-19 DIAGNOSIS — Z955 Presence of coronary angioplasty implant and graft: Secondary | ICD-10-CM | POA: Diagnosis not present

## 2016-02-19 DIAGNOSIS — K5732 Diverticulitis of large intestine without perforation or abscess without bleeding: Secondary | ICD-10-CM | POA: Insufficient documentation

## 2016-02-19 DIAGNOSIS — R1030 Lower abdominal pain, unspecified: Secondary | ICD-10-CM | POA: Diagnosis present

## 2016-02-19 DIAGNOSIS — I10 Essential (primary) hypertension: Secondary | ICD-10-CM | POA: Insufficient documentation

## 2016-02-19 LAB — COMPREHENSIVE METABOLIC PANEL
ALBUMIN: 4.2 g/dL (ref 3.5–5.0)
ALT: 38 U/L (ref 17–63)
ANION GAP: 9 (ref 5–15)
AST: 26 U/L (ref 15–41)
Alkaline Phosphatase: 83 U/L (ref 38–126)
BILIRUBIN TOTAL: 0.8 mg/dL (ref 0.3–1.2)
BUN: 16 mg/dL (ref 6–20)
CHLORIDE: 101 mmol/L (ref 101–111)
CO2: 27 mmol/L (ref 22–32)
Calcium: 9.7 mg/dL (ref 8.9–10.3)
Creatinine, Ser: 0.93 mg/dL (ref 0.61–1.24)
GFR calc Af Amer: >60 mL/min (ref >60)
GLUCOSE: 164 mg/dL — AB (ref 65–99)
POTASSIUM: 3.9 mmol/L (ref 3.5–5.1)
Sodium: 137 mmol/L (ref 135–145)
TOTAL PROTEIN: 7.2 g/dL (ref 6.5–8.1)

## 2016-02-19 LAB — LIPASE, BLOOD: LIPASE: 37 U/L (ref 11–51)

## 2016-02-19 LAB — CBC
HEMATOCRIT: 42.5 % (ref 39.0–52.0)
HEMOGLOBIN: 15 g/dL (ref 13.0–17.0)
MCH: 30.7 pg (ref 26.0–34.0)
MCHC: 35.3 g/dL (ref 30.0–36.0)
MCV: 86.9 fL (ref 78.0–100.0)
Platelets: 217 10*3/uL (ref 150–400)
RBC: 4.89 MIL/uL (ref 4.22–5.81)
RDW: 13.7 % (ref 11.5–15.5)
WBC: 11.5 10*3/uL — AB (ref 4.0–10.5)

## 2016-02-19 LAB — URINALYSIS, ROUTINE W REFLEX MICROSCOPIC
Bilirubin Urine: NEGATIVE
GLUCOSE, UA: NEGATIVE mg/dL
Hgb urine dipstick: NEGATIVE
Ketones, ur: NEGATIVE mg/dL
LEUKOCYTES UA: NEGATIVE
NITRITE: NEGATIVE
PH: 5 (ref 5.0–8.0)
Protein, ur: NEGATIVE mg/dL
SPECIFIC GRAVITY, URINE: 1.019 (ref 1.005–1.030)

## 2016-02-19 MED ORDER — TRAMADOL HCL 50 MG PO TABS: 50.0000 mg | ORAL_TABLET | Freq: Four times a day (QID) | ORAL | 0 refills | Status: DC | PRN

## 2016-02-19 MED ORDER — IOPAMIDOL (ISOVUE-300) INJECTION 61%
INTRAVENOUS | Status: AC
  Administered 2016-02-19: 100 mL
  Filled 2016-02-19: qty 100

## 2016-02-19 MED ORDER — CIPROFLOXACIN HCL 500 MG PO TABS
500.0000 mg | ORAL_TABLET | Freq: Once | ORAL | Status: AC
  Administered 2016-02-19: 500 mg via ORAL
  Filled 2016-02-19: qty 1

## 2016-02-19 MED ORDER — METRONIDAZOLE 500 MG PO TABS
500.0000 mg | ORAL_TABLET | Freq: Once | ORAL | Status: AC
  Administered 2016-02-19: 500 mg via ORAL
  Filled 2016-02-19: qty 1

## 2016-02-19 MED ORDER — METRONIDAZOLE 500 MG PO TABS: 500.0000 mg | ORAL_TABLET | Freq: Three times a day (TID) | ORAL | 0 refills | Status: DC

## 2016-02-19 MED ORDER — CIPROFLOXACIN HCL 500 MG PO TABS: 500.0000 mg | ORAL_TABLET | Freq: Two times a day (BID) | ORAL | 0 refills | Status: DC

## 2016-02-19 NOTE — ED Triage Notes (Addendum)
C/o intermittent stabbing pain to abd x 2 days with decreased amount of bowel movement and weaker stream when urinating.  Denies nausea and vomiting.

## 2016-02-19 NOTE — ED Notes (Signed)
ED Provider at bedside. 

## 2016-02-19 NOTE — ED Provider Notes (Signed)
[facility]-EMERGENCY DEPT Provider Note   CSN: [id number] Arrival date & time: 02/19/16  1800     History   Chief Complaint Chief Complaint  Patient presents with  . Abdominal Pain    HPI [name] is a 60 y.o. male.  Patient is a 60 year old male who presents with abdominal pain. He states over last 2 days he's had worsening pain across his lower abdomen. It's in the middle and little bit more on the right side. There is no back pain. No testicular pain. He states it comes on suddenly and doubles him over. It waxes and wanes in intensity. He denies any nausea or vomiting. No urinary symptoms other than over the last 2 days he doesn't feel like he's had a normal stream. No obvious hematuria. No fevers. He has had some constipation and took a stool softener today with some improvement in symptoms but he still has the pain. He did have a recent inguinal hernia repair last December.      Past Medical History:  Diagnosis Date  . Abnormal nuclear stress test 10/21/2013   Intermediate Risk - septal, anterolateral ischemia --> cath with LAD, diagonal and circumflex disease.   Marland Kitchen Anxiety   . Arthritis    "mostly in my hands; sometimes other areas" (10/20/2013)  . Bipolar disorder (HCC)   . BPH (benign prostatic hyperplasia)   . CAD S/P percutaneous coronary angioplasty 06/2011, 09/2013   (Therapeutic P2Y12 on Plavix);; a) STEMI -mid RCA DES PCI; 2015: Class III angina (abnormal stress test with anterior- lateral ischemia -> mid-distal LAD 95-99% (PCI: Biotronik AG 2 DES 2.5 x 13, 2.75 mm), D1 proximal 50-60% (FFR 0.6) with 60-70% in small branch (med management), mid circumflex 50-60% (FFR 1.0)   . Depression   . Dizziness of unknown cause January 2011   abn MRI, Neuro w/u Jan 2011  . Dyslipidemia, goal LDL below 70   . Essential hypertension   . GERD (gastroesophageal reflux disease)   . RA (rheumatoid arthritis) (HCC)   . Rheumatic fever 1969  . ST elevation myocardial infarction  (STEMI) involving right coronary artery in recovery phase (HCC) 06/2011   100% occluded RCA; PCI - 3.0x24 Promus DES (3.5-3.6 mm) ; Modwith moderate LAD & Cx disease, EF 55-60% w/ basal inferoseptal HK confirmed by echo    Patient Active Problem List   Diagnosis Date Noted  . Preoperative cardiovascular examination 03/10/2015  . Angina, class II - decreased exercise tolerance, dyspnea & fatigue 10/20/2013  . Abnormal nuclear cardiac imaging test - INTERMEDIATE RISK 10/20/2013  . Presence of drug coated stent in LAD coronary artery: Biotronik AG DES 2.5 mm x 13 mm (2.75 mm) 10/20/2013  . CAD S/P percutaneous coronary angioplasty; therapeutic P2Y12 for Plavix   . Dyslipidemia, goal LDL below 70   . Presence of DESin RCA - Promus DES 3.0 mm x 24 mm (post-dilated to 3.30mm) 06/29/2011    Class: Acute  . Essential hypertension 06/28/2011  . Anxiety 06/28/2011  . BPH (benign prostatic hyperplasia) 06/28/2011  . History of ST elevation myocardial infarction (STEMI) of inferior wall 06/01/2011    Class: History of    Past Surgical History:  Procedure Laterality Date  . LEFT HEART CATHETERIZATION WITH CORONARY ANGIOGRAM N/A 06/28/2011   Procedure: LEFT HEART CATHETERIZATION WITH CORONARY ANGIOGRAM;  Surgeon: Marykay Lex, MD;  Location: [facility] CATH LAB;  Service: Cardiovascular;  Laterality: N/A;  . LEFT HEART CATHETERIZATION WITH CORONARY ANGIOGRAM N/A 10/20/2013   Procedure: LEFT HEART CATHETERIZATION  WITH CORONARY ANGIOGRAM;  Surgeon: Marykay Lex, MD;  Location: [facility] CATH LAB;  Service: Cardiovascular;  Laterality: N/A;  . NM MYOVIEW LTD  10/19/2013   INTERMEDIATE RISK, septal, anterior-anterolateral ischemia --> referred for cath the following day  . PERCUTANEOUS CORONARY STENT INTERVENTION (PCI-S)  06/2011; 09/2013   a) 3/'13: 100%  RCA ->PCI 3.20x2 (3.6) Promus DES stent; 7/'15: mLAD PCI Biotronik AG DES 2.5 x13 2.75 mm), FFR D1 50-60% = 0.86 (small branch had ~70%), mCx ~50-60% FFR=1.0.  .  PLANTAR FASCIA SURGERY Left 2004   "& neuroma"  . SHOULDER ARTHROSCOPY W/ ROTATOR CUFF REPAIR Left July 2011  . TRANSTHORACIC ECHOCARDIOGRAM  March 2013   EF 55%-60% with mild basilar inferoseptal hypokinesis also noted on LV-gram       Home Medications    Prior to Admission medications   Medication Sig Start Date End Date Taking? Authorizing Provider  amLODipine (NORVASC) 5 MG tablet Take 1 tablet (5 mg total) by mouth daily. 03/08/15  Yes Marykay Lex, MD  atorvastatin (LIPITOR) 40 MG tablet TAKE 1 TABLET BY MOUTH DAILY 10/06/15  Yes Marykay Lex, MD  clopidogrel (PLAVIX) 75 MG tablet TAKE 1 TABLET BY MOUTH DAILY 07/26/15  Yes Marykay Lex, MD  diazepam (VALIUM) 2 MG tablet Take 2 mg by mouth daily as needed for spasms. 01/12/16  Yes Historical Provider, MD  eszopiclone (LUNESTA) 1 MG TABS tablet Take 1 mg by mouth at bedtime as needed for sleep. 01/18/16  Yes Historical Provider, MD  finasteride (PROSCAR) 5 MG tablet Take 5 mg by mouth daily.   Yes Historical Provider, MD  fluticasone (FLONASE) 50 MCG/ACT nasal spray Place 1 spray into the nose daily as needed. For allergies   Yes Historical Provider, MD  lisinopril (PRINIVIL,ZESTRIL) 20 MG tablet Take 1 tablet (20 mg total) by mouth daily. 03/08/15  Yes Marykay Lex, MD  nitroGLYCERIN (NITROSTAT) 0.4 MG SL tablet Place 1 tablet (0.4 mg total) under the tongue every 5 (five) minutes as needed for chest pain. 10/21/13  Yes Leone Brand, NP  oxyCODONE (OXY IR/ROXICODONE) 5 MG immediate release tablet Take 5 mg by mouth daily as needed for pain. 11/16/15  Yes Historical Provider, MD  predniSONE (DELTASONE) 5 MG tablet Take 5 mg by mouth daily. 02/02/16  Yes Historical Provider, MD  ciprofloxacin (CIPRO) 500 MG tablet Take 1 tablet (500 mg total) by mouth 2 (two) times daily. One po bid x 10 days 02/19/16   Rolan Bucco, MD  metroNIDAZOLE (FLAGYL) 500 MG tablet Take 1 tablet (500 mg total) by mouth 3 (three) times daily. x 10 days  02/19/16   Rolan Bucco, MD  traMADol (ULTRAM) 50 MG tablet Take 1 tablet (50 mg total) by mouth every 6 (six) hours as needed. 02/19/16   Rolan Bucco, MD    Family History Family History  Problem Relation Age of Onset  . Heart failure Maternal Grandmother 91  . Cancer Paternal Grandmother 51  . Heart failure Paternal Grandfather 34  . Cancer - Colon Mother 19  . Heart attack Father 41    open heart surgery/valve replaced    Social History Social History  Substance Use Topics  . Smoking status: Never Smoker  . Smokeless tobacco: Never Used  . Alcohol use No     Allergies   Patient has no known allergies.   Review of Systems Review of Systems  Constitutional: Negative for chills, diaphoresis, fatigue and fever.  HENT: Negative for congestion, rhinorrhea  and sneezing.   Eyes: Negative.   Respiratory: Negative for cough, chest tightness and shortness of breath.   Cardiovascular: Negative for chest pain and leg swelling.  Gastrointestinal: Positive for abdominal pain and constipation. Negative for blood in stool, diarrhea, nausea and vomiting.  Genitourinary: Negative for difficulty urinating, flank pain, frequency and hematuria.  Musculoskeletal: Negative for arthralgias and back pain.  Skin: Negative for rash.  Neurological: Negative for dizziness, speech difficulty, weakness, numbness and headaches.     Physical Exam Updated Vital Signs BP 130/90   Pulse 86   Temp 98.4 F (36.9 C) (Oral)   Resp 16   Ht 5\' 10"  (1.778 m)   Wt 199 lb 7 oz (90.5 kg)   SpO2 97%   BMI 28.62 kg/m   Physical Exam  Constitutional: He is oriented to person, place, and time. He appears well-developed and well-nourished.  HENT:  Head: Normocephalic and atraumatic.  Eyes: Pupils are equal, round, and reactive to light.  Neck: Normal range of motion. Neck supple.  Cardiovascular: Normal rate, regular rhythm and normal heart sounds.   Pulmonary/Chest: Effort normal and breath sounds  normal. No respiratory distress. He has no wheezes. He has no rales. He exhibits no tenderness.  Abdominal: Soft. Bowel sounds are normal. There is tenderness (Positive tenderness in the suprapubic area and right lower quadrant. There is some tenderness in the right inguinal area. No testicular tenderness.). There is no rebound and no guarding.  Musculoskeletal: Normal range of motion. He exhibits no edema.  Lymphadenopathy:    He has no cervical adenopathy.  Neurological: He is alert and oriented to person, place, and time.  Skin: Skin is warm and dry. No rash noted.  Psychiatric: He has a normal mood and affect.     ED Treatments / Results  Labs (all labs ordered are listed, but only abnormal results are displayed) Labs Reviewed  COMPREHENSIVE METABOLIC PANEL - Abnormal; Notable for the following:       Result Value   Glucose, Bld 164 (*)    All other components within normal limits  CBC - Abnormal; Notable for the following:    WBC 11.5 (*)    All other components within normal limits  LIPASE, BLOOD  URINALYSIS, ROUTINE W REFLEX MICROSCOPIC (NOT AT [facility])    EKG  EKG Interpretation None       Radiology Ct Abdomen Pelvis W Contrast  Result Date: 02/19/2016 CLINICAL DATA:  Acute onset of mid abdominal pain and diarrhea. Decreased urinary output. Initial encounter. EXAM: CT ABDOMEN AND PELVIS WITH CONTRAST TECHNIQUE: Multidetector CT imaging of the abdomen and pelvis was performed using the standard protocol following bolus administration of intravenous contrast. CONTRAST:  02/21/2016 ISOVUE-300 IOPAMIDOL (ISOVUE-300) INJECTION 61% COMPARISON:  None. FINDINGS: Lower chest: Mild bibasilar atelectasis is noted. Diffuse coronary artery calcifications are seen. Hepatobiliary: The liver is unremarkable in appearance. The gallbladder is unremarkable in appearance. The common bile duct remains normal in caliber. Pancreas: The pancreas is within normal limits. Spleen: The spleen is  unremarkable in appearance. Adrenals/Urinary Tract: The adrenal glands are unremarkable in appearance. The kidneys are within normal limits. There is no evidence of hydronephrosis. No renal or ureteral stones are identified. Mild nonspecific perinephric stranding is noted bilaterally. Stomach/Bowel: The stomach is unremarkable in appearance. The small bowel is within normal limits. The appendix is not visualized; there is no evidence for appendicitis. The colon is unremarkable in appearance. Focal wall thickening is noted at the mid sigmoid colon, with associated soft  tissue inflammation and inflamed diverticula, and trace underlying free fluid, compatible with acute diverticulitis. There is no evidence of perforation or abscess formation at this time. Vascular/Lymphatic: Mild scattered calcification is seen along the abdominal aorta and its branches. No retroperitoneal or pelvic sidewall lymphadenopathy is seen. Reproductive: The bladder is mildly distended and grossly unremarkable. The prostate remains normal in size. Other: No additional soft tissue abnormalities are seen. Musculoskeletal: No acute osseous abnormalities are identified. Sclerotic change is noted at the pubic symphysis. The visualized musculature is unremarkable in appearance. IMPRESSION: 1. Acute diverticulitis at the mid sigmoid colon, with soft tissue inflammation, focal wall thickening and trace underlying free fluid. No evidence of perforation or abscess formation at this time. 2. Mild bibasilar atelectasis noted. 3. Diffuse coronary artery calcifications seen. 4. Mild scattered aortic atherosclerosis. Electronically Signed   By: Roanna Raider M.D.   On: 02/19/2016 21:55    Procedures Procedures (including critical care time)  Medications Ordered in ED Medications  iopamidol (ISOVUE-300) 61 % injection (100 mLs  Contrast Given 02/19/16 2126)  ciprofloxacin (CIPRO) tablet 500 mg (500 mg Oral Given 02/19/16 2237)  metroNIDAZOLE  (FLAGYL) tablet 500 mg (500 mg Oral Given 02/19/16 2237)     Initial Impression / Assessment and Plan / ED Course  I have reviewed the triage vital signs and the nursing notes.  Pertinent labs & imaging results that were available during my care of the patient were reviewed by me and considered in my medical decision making (see chart for details).  Clinical Course     Patient's labs are non-concerning. CT scan shows evidence of diverticulitis. He's otherwise well-appearing and I feel that he can be treated as an outpatient. He was prescribed Cipro Flagyl and tramadol. He was encouraged to follow-up with his PCP if his symptoms are not improving or return here as needed for any worsening symptoms.  Final Clinical Impressions(s) / ED Diagnoses   Final diagnoses:  Diverticulitis of large intestine without perforation or abscess without bleeding    New Prescriptions New Prescriptions   CIPROFLOXACIN (CIPRO) 500 MG TABLET    Take 1 tablet (500 mg total) by mouth 2 (two) times daily. One po bid x 10 days   METRONIDAZOLE (FLAGYL) 500 MG TABLET    Take 1 tablet (500 mg total) by mouth 3 (three) times daily. x 10 days   TRAMADOL (ULTRAM) 50 MG TABLET    Take 1 tablet (50 mg total) by mouth every 6 (six) hours as needed.     Rolan Bucco, MD 02/19/16 2300

## 2016-02-22 DIAGNOSIS — M25611 Stiffness of right shoulder, not elsewhere classified: Secondary | ICD-10-CM | POA: Diagnosis not present

## 2016-02-22 DIAGNOSIS — R3912 Poor urinary stream: Secondary | ICD-10-CM | POA: Diagnosis not present

## 2016-02-22 DIAGNOSIS — N5201 Erectile dysfunction due to arterial insufficiency: Secondary | ICD-10-CM | POA: Diagnosis not present

## 2016-02-22 DIAGNOSIS — M25511 Pain in right shoulder: Secondary | ICD-10-CM | POA: Diagnosis not present

## 2016-02-22 DIAGNOSIS — M75120 Complete rotator cuff tear or rupture of unspecified shoulder, not specified as traumatic: Secondary | ICD-10-CM | POA: Diagnosis not present

## 2016-02-22 DIAGNOSIS — N401 Enlarged prostate with lower urinary tract symptoms: Secondary | ICD-10-CM | POA: Diagnosis not present

## 2016-02-24 ENCOUNTER — Other Ambulatory Visit: Payer: Self-pay | Admitting: Cardiology

## 2016-02-24 DIAGNOSIS — M75120 Complete rotator cuff tear or rupture of unspecified shoulder, not specified as traumatic: Secondary | ICD-10-CM | POA: Diagnosis not present

## 2016-02-24 DIAGNOSIS — M25611 Stiffness of right shoulder, not elsewhere classified: Secondary | ICD-10-CM | POA: Diagnosis not present

## 2016-02-24 DIAGNOSIS — M25511 Pain in right shoulder: Secondary | ICD-10-CM | POA: Diagnosis not present

## 2016-02-27 NOTE — Telephone Encounter (Signed)
REFILL 

## 2016-02-29 DIAGNOSIS — M75120 Complete rotator cuff tear or rupture of unspecified shoulder, not specified as traumatic: Secondary | ICD-10-CM | POA: Diagnosis not present

## 2016-02-29 DIAGNOSIS — M25611 Stiffness of right shoulder, not elsewhere classified: Secondary | ICD-10-CM | POA: Diagnosis not present

## 2016-02-29 DIAGNOSIS — M25511 Pain in right shoulder: Secondary | ICD-10-CM | POA: Diagnosis not present

## 2016-03-01 DIAGNOSIS — M19011 Primary osteoarthritis, right shoulder: Secondary | ICD-10-CM | POA: Diagnosis not present

## 2016-03-02 DIAGNOSIS — M25611 Stiffness of right shoulder, not elsewhere classified: Secondary | ICD-10-CM | POA: Diagnosis not present

## 2016-03-02 DIAGNOSIS — M25511 Pain in right shoulder: Secondary | ICD-10-CM | POA: Diagnosis not present

## 2016-03-02 DIAGNOSIS — M75120 Complete rotator cuff tear or rupture of unspecified shoulder, not specified as traumatic: Secondary | ICD-10-CM | POA: Diagnosis not present

## 2016-03-07 ENCOUNTER — Other Ambulatory Visit: Payer: Self-pay | Admitting: Cardiology

## 2016-03-07 DIAGNOSIS — M25611 Stiffness of right shoulder, not elsewhere classified: Secondary | ICD-10-CM | POA: Diagnosis not present

## 2016-03-07 DIAGNOSIS — M25511 Pain in right shoulder: Secondary | ICD-10-CM | POA: Diagnosis not present

## 2016-03-07 DIAGNOSIS — M75120 Complete rotator cuff tear or rupture of unspecified shoulder, not specified as traumatic: Secondary | ICD-10-CM | POA: Diagnosis not present

## 2016-03-13 DIAGNOSIS — M25511 Pain in right shoulder: Secondary | ICD-10-CM | POA: Diagnosis not present

## 2016-03-13 DIAGNOSIS — M75120 Complete rotator cuff tear or rupture of unspecified shoulder, not specified as traumatic: Secondary | ICD-10-CM | POA: Diagnosis not present

## 2016-03-13 DIAGNOSIS — M25611 Stiffness of right shoulder, not elsewhere classified: Secondary | ICD-10-CM | POA: Diagnosis not present

## 2016-03-16 DIAGNOSIS — M25611 Stiffness of right shoulder, not elsewhere classified: Secondary | ICD-10-CM | POA: Diagnosis not present

## 2016-03-16 DIAGNOSIS — M25511 Pain in right shoulder: Secondary | ICD-10-CM | POA: Diagnosis not present

## 2016-03-16 DIAGNOSIS — M75120 Complete rotator cuff tear or rupture of unspecified shoulder, not specified as traumatic: Secondary | ICD-10-CM | POA: Diagnosis not present

## 2016-03-19 DIAGNOSIS — M19011 Primary osteoarthritis, right shoulder: Secondary | ICD-10-CM | POA: Diagnosis not present

## 2016-03-29 DIAGNOSIS — M7501 Adhesive capsulitis of right shoulder: Secondary | ICD-10-CM | POA: Diagnosis not present

## 2016-03-29 DIAGNOSIS — G8918 Other acute postprocedural pain: Secondary | ICD-10-CM | POA: Diagnosis not present

## 2016-03-29 DIAGNOSIS — M19011 Primary osteoarthritis, right shoulder: Secondary | ICD-10-CM | POA: Diagnosis not present

## 2016-03-29 DIAGNOSIS — Z9889 Other specified postprocedural states: Secondary | ICD-10-CM | POA: Diagnosis not present

## 2016-03-30 DIAGNOSIS — M25611 Stiffness of right shoulder, not elsewhere classified: Secondary | ICD-10-CM | POA: Diagnosis not present

## 2016-03-30 DIAGNOSIS — M75120 Complete rotator cuff tear or rupture of unspecified shoulder, not specified as traumatic: Secondary | ICD-10-CM | POA: Diagnosis not present

## 2016-03-30 DIAGNOSIS — M25511 Pain in right shoulder: Secondary | ICD-10-CM | POA: Diagnosis not present

## 2016-04-03 DIAGNOSIS — M25511 Pain in right shoulder: Secondary | ICD-10-CM | POA: Diagnosis not present

## 2016-04-03 DIAGNOSIS — M75120 Complete rotator cuff tear or rupture of unspecified shoulder, not specified as traumatic: Secondary | ICD-10-CM | POA: Diagnosis not present

## 2016-04-03 DIAGNOSIS — M25611 Stiffness of right shoulder, not elsewhere classified: Secondary | ICD-10-CM | POA: Diagnosis not present

## 2016-04-06 DIAGNOSIS — M25511 Pain in right shoulder: Secondary | ICD-10-CM | POA: Diagnosis not present

## 2016-04-06 DIAGNOSIS — M25611 Stiffness of right shoulder, not elsewhere classified: Secondary | ICD-10-CM | POA: Diagnosis not present

## 2016-04-06 DIAGNOSIS — M75120 Complete rotator cuff tear or rupture of unspecified shoulder, not specified as traumatic: Secondary | ICD-10-CM | POA: Diagnosis not present

## 2016-04-09 DIAGNOSIS — M19011 Primary osteoarthritis, right shoulder: Secondary | ICD-10-CM | POA: Diagnosis not present

## 2016-04-10 DIAGNOSIS — M25611 Stiffness of right shoulder, not elsewhere classified: Secondary | ICD-10-CM | POA: Diagnosis not present

## 2016-04-10 DIAGNOSIS — M75120 Complete rotator cuff tear or rupture of unspecified shoulder, not specified as traumatic: Secondary | ICD-10-CM | POA: Diagnosis not present

## 2016-04-10 DIAGNOSIS — M25511 Pain in right shoulder: Secondary | ICD-10-CM | POA: Diagnosis not present

## 2016-04-12 ENCOUNTER — Other Ambulatory Visit: Payer: Self-pay | Admitting: Cardiology

## 2016-04-12 DIAGNOSIS — M75120 Complete rotator cuff tear or rupture of unspecified shoulder, not specified as traumatic: Secondary | ICD-10-CM | POA: Diagnosis not present

## 2016-04-12 DIAGNOSIS — M25611 Stiffness of right shoulder, not elsewhere classified: Secondary | ICD-10-CM | POA: Diagnosis not present

## 2016-04-12 DIAGNOSIS — M25511 Pain in right shoulder: Secondary | ICD-10-CM | POA: Diagnosis not present

## 2016-04-17 DIAGNOSIS — M75120 Complete rotator cuff tear or rupture of unspecified shoulder, not specified as traumatic: Secondary | ICD-10-CM | POA: Diagnosis not present

## 2016-04-17 DIAGNOSIS — M25611 Stiffness of right shoulder, not elsewhere classified: Secondary | ICD-10-CM | POA: Diagnosis not present

## 2016-04-17 DIAGNOSIS — M25511 Pain in right shoulder: Secondary | ICD-10-CM | POA: Diagnosis not present

## 2016-04-24 DIAGNOSIS — M25611 Stiffness of right shoulder, not elsewhere classified: Secondary | ICD-10-CM | POA: Diagnosis not present

## 2016-04-24 DIAGNOSIS — M25511 Pain in right shoulder: Secondary | ICD-10-CM | POA: Diagnosis not present

## 2016-04-24 DIAGNOSIS — M75120 Complete rotator cuff tear or rupture of unspecified shoulder, not specified as traumatic: Secondary | ICD-10-CM | POA: Diagnosis not present

## 2016-04-27 DIAGNOSIS — M25611 Stiffness of right shoulder, not elsewhere classified: Secondary | ICD-10-CM | POA: Diagnosis not present

## 2016-04-27 DIAGNOSIS — M25511 Pain in right shoulder: Secondary | ICD-10-CM | POA: Diagnosis not present

## 2016-04-27 DIAGNOSIS — M75120 Complete rotator cuff tear or rupture of unspecified shoulder, not specified as traumatic: Secondary | ICD-10-CM | POA: Diagnosis not present

## 2016-04-30 ENCOUNTER — Telehealth: Payer: Self-pay | Admitting: Cardiology

## 2016-04-30 DIAGNOSIS — Z79899 Other long term (current) drug therapy: Secondary | ICD-10-CM

## 2016-04-30 DIAGNOSIS — E785 Hyperlipidemia, unspecified: Secondary | ICD-10-CM

## 2016-04-30 NOTE — Telephone Encounter (Signed)
[name] is calling because he is having some medical issues and wants to speak with you about them . Please call

## 2016-04-30 NOTE — Telephone Encounter (Signed)
Returned call to patient Patient has experienced dizziness & fatigue  -- feels like he may pass out but has not  -- positional dizziness/lightheadedness  -- no energy when he plays golf - unusual for him  -- patient endorses SOB, denies chest pain Patient states these episodes have been going on "a while" He had shoulder surgery in August and has not done a lot of activity since then  Patient would like to see MD - scheduled for Monday Feb 12 Patient was supposed to have CMP & lipid ordered at last appt but he did not get this done. Reordered labs and advised that patient get blood work prior to appt.

## 2016-05-07 DIAGNOSIS — M7501 Adhesive capsulitis of right shoulder: Secondary | ICD-10-CM | POA: Diagnosis not present

## 2016-05-09 DIAGNOSIS — E785 Hyperlipidemia, unspecified: Secondary | ICD-10-CM | POA: Diagnosis not present

## 2016-05-09 DIAGNOSIS — Z79899 Other long term (current) drug therapy: Secondary | ICD-10-CM | POA: Diagnosis not present

## 2016-05-09 LAB — LIPID PANEL
CHOLESTEROL: 133 mg/dL (ref <200)
HDL: 30 mg/dL — AB (ref >40)
LDL CALC: 72 mg/dL (ref <100)
TRIGLYCERIDES: 157 mg/dL — AB (ref <150)
Total CHOL/HDL Ratio: 4.4 Ratio (ref <5.0)
VLDL: 31 mg/dL — AB (ref <30)

## 2016-05-09 LAB — COMPREHENSIVE METABOLIC PANEL
ALBUMIN: 4.8 g/dL (ref 3.6–5.1)
ALT: 51 U/L — ABNORMAL HIGH (ref 9–46)
AST: 39 U/L — ABNORMAL HIGH (ref 10–35)
Alkaline Phosphatase: 84 U/L (ref 40–115)
BUN: 22 mg/dL (ref 7–25)
CALCIUM: 9.5 mg/dL (ref 8.6–10.3)
CHLORIDE: 104 mmol/L (ref 98–110)
CO2: 27 mmol/L (ref 20–31)
Creat: 0.86 mg/dL (ref 0.70–1.25)
Glucose, Bld: 114 mg/dL — ABNORMAL HIGH (ref 65–99)
POTASSIUM: 4.4 mmol/L (ref 3.5–5.3)
Sodium: 142 mmol/L (ref 135–146)
Total Bilirubin: 1 mg/dL (ref 0.2–1.2)
Total Protein: 6.9 g/dL (ref 6.1–8.1)

## 2016-05-14 ENCOUNTER — Encounter: Payer: Self-pay | Admitting: Cardiology

## 2016-05-14 ENCOUNTER — Ambulatory Visit (INDEPENDENT_AMBULATORY_CARE_PROVIDER_SITE_OTHER): Payer: Federal, State, Local not specified - PPO | Admitting: Cardiology

## 2016-05-14 VITALS — BP 116/76 | HR 65 | Ht 70.0 in | Wt 203.4 lb

## 2016-05-14 DIAGNOSIS — R0609 Other forms of dyspnea: Secondary | ICD-10-CM | POA: Insufficient documentation

## 2016-05-14 DIAGNOSIS — I10 Essential (primary) hypertension: Secondary | ICD-10-CM | POA: Diagnosis not present

## 2016-05-14 DIAGNOSIS — Z9861 Coronary angioplasty status: Secondary | ICD-10-CM

## 2016-05-14 DIAGNOSIS — I2119 ST elevation (STEMI) myocardial infarction involving other coronary artery of inferior wall: Secondary | ICD-10-CM | POA: Diagnosis not present

## 2016-05-14 DIAGNOSIS — E785 Hyperlipidemia, unspecified: Secondary | ICD-10-CM

## 2016-05-14 DIAGNOSIS — R0602 Shortness of breath: Secondary | ICD-10-CM | POA: Diagnosis not present

## 2016-05-14 DIAGNOSIS — R5383 Other fatigue: Secondary | ICD-10-CM | POA: Insufficient documentation

## 2016-05-14 DIAGNOSIS — I2510 Atherosclerotic heart disease of native coronary artery without angina pectoris: Secondary | ICD-10-CM

## 2016-05-14 DIAGNOSIS — R0600 Dyspnea, unspecified: Secondary | ICD-10-CM | POA: Insufficient documentation

## 2016-05-14 MED ORDER — LOSARTAN POTASSIUM 25 MG PO TABS: 25.0000 mg | ORAL_TABLET | Freq: Every day | ORAL | 3 refills | Status: DC

## 2016-05-14 NOTE — Progress Notes (Signed)
PCP: [name], MD  Clinic Note: Chief Complaint  Patient presents with  . Coronary Artery Disease  . Fatigue    Mild exercise intolerance.  . Dizziness    Orthostatic. Also sometimes with changing head position  . Cough    Associated with intermittent chest discomfort only when coughing as well as some dyspnea.    HPI: [name] is a 61 y.o. male with a PMH below who presents today for work in follow-up visit to assess symptoms. He has a history of CAD/STEMI=> PCI.  1. Inferior STEMI March 2013 - PCI to RCA 2. Crescendo class III angina in July 2015 -- abnormal Myoview. --> Cardiac Cath => severe mid LAD disease treated with DES stent that was a new bioabsorbable polymer study stent. He also had lesions in the first diagonal and mid circumflex that were evaluated with FFR & med Rx. He was continued on Plavix  [name] was last seen in June 2017. Back in June 2016, we increased lisinopril 20 and added amlodipine 2.5 mg. He mostly notes feeling fatigued at the end of a long day at work. He notes lack of motivation and increased work stress. I did a preoperative evaluation for surgery for the repair.  Retired from [employer] end of April.  Wife retired the end of this month.   Recent Hospitalizations:  Diverticulitis in November 2017   Studies Reviewed: None  Interval History: [name] presents today for routine follow-up without any cardiac symptoms. He retired 2 months ago and is now much more relaxed and under less stress. He notes less concerning symptoms of twinges in his chest or palpitations. He has not really had any untoward symptoms. No PND orthopnea or edema. No anginal pain with rest or exertion. No resting or exertional dyspnea.   No palpitations, lightheadedness, dizziness, weakness or syncope/near syncope. No TIA/amaurosis fugax symptoms. No melena, hematochezia, hematuria, or epstaxis. No claudication.  ROS: A comprehensive was  performed. Review of Systems  Constitutional: Positive for malaise/fatigue (Still not yet feeling his energy level is back to where should be. Not as it was preop.).  HENT: Negative for nosebleeds.   Respiratory: Positive for cough (Coughing symptoms that are followed by pressure in the chest and palpitations). Negative for shortness of breath and wheezing.   Cardiovascular: Positive for palpitations. Negative for claudication.       Per history of present illness  Gastrointestinal: Negative for abdominal pain (Notes a lot of stomach gurgling after he eats.), blood in stool, constipation and melena.  Genitourinary: Negative for dysuria and hematuria.  Musculoskeletal: Positive for joint pain (Mild arthritis pains; worst symptom however though is right shoulder pain that is very significant. He is in the process of being evaluated for possible right shoulder surgery.). Negative for myalgias.  Skin: Negative for rash.  Neurological: Negative for dizziness, focal weakness and headaches.  Psychiatric/Behavioral: Negative for depression and memory loss. The patient has insomnia (Having a hard time sleeping. Partly because of the coughing and foot tingling as well as RLS symptoms.). The patient is not nervous/anxious.        Notably improved anxiety and depression type symptoms. This is all stemming from the fact he just retired.  All other systems reviewed and are negative.   Past Medical History:  Diagnosis Date  . Abnormal nuclear stress test 10/21/2013   Intermediate Risk - septal, anterolateral ischemia --> cath with LAD, diagonal and circumflex disease.   Marland Kitchen Anxiety   . Arthritis    "  mostly in my hands; sometimes other areas" (10/20/2013)  . Bipolar disorder (HCC)   . BPH (benign prostatic hyperplasia)   . CAD S/P percutaneous coronary angioplasty 06/2011, 09/2013   (Therapeutic P2Y12 on Plavix);; a) STEMI -mid RCA DES PCI; 2015: Class III angina (abnormal stress test with anterior- lateral  ischemia -> mid-distal LAD 95-99% (PCI: Biotronik AG 2 DES 2.5 x 13, 2.75 mm), D1 proximal 50-60% (FFR 0.6) with 60-70% in small branch (med management), mid circumflex 50-60% (FFR 1.0)   . Depression   . Dizziness of unknown cause January 2011   abn MRI, Neuro w/u Jan 2011  . Dyslipidemia, goal LDL below 70   . Essential hypertension   . GERD (gastroesophageal reflux disease)   . RA (rheumatoid arthritis) (HCC)   . Rheumatic fever 1969  . ST elevation myocardial infarction (STEMI) involving right coronary artery in recovery phase (HCC) 06/2011   100% occluded RCA; PCI - 3.0x24 Promus DES (3.5-3.6 mm) ; Modwith moderate LAD & Cx disease, EF 55-60% w/ basal inferoseptal HK confirmed by echo    Past Surgical History:  Procedure Laterality Date  . LEFT HEART CATHETERIZATION WITH CORONARY ANGIOGRAM N/A 06/28/2011   Procedure: LEFT HEART CATHETERIZATION WITH CORONARY ANGIOGRAM;  Surgeon: Marykay Lex, MD;  Location: [facility] CATH LAB;  Service: Cardiovascular;  Laterality: N/A;  . LEFT HEART CATHETERIZATION WITH CORONARY ANGIOGRAM N/A 10/20/2013   Procedure: LEFT HEART CATHETERIZATION WITH CORONARY ANGIOGRAM;  Surgeon: Marykay Lex, MD;  Location: [facility] CATH LAB;  Service: Cardiovascular;  Laterality: N/A;  . NM MYOVIEW LTD  10/19/2013   INTERMEDIATE RISK, septal, anterior-anterolateral ischemia --> referred for cath the following day  . PERCUTANEOUS CORONARY STENT INTERVENTION (PCI-S)  06/2011; 09/2013   a) 3/'13: 100%  RCA ->PCI 3.20x2 (3.6) Promus DES stent; 7/'15: mLAD PCI Biotronik AG DES 2.5 x13 2.75 mm), FFR D1 50-60% = 0.86 (small branch had ~70%), mCx ~50-60% FFR=1.0.  . PLANTAR FASCIA SURGERY Left 2004   "& neuroma"  . SHOULDER ARTHROSCOPY W/ ROTATOR CUFF REPAIR Left July 2011  . TRANSTHORACIC ECHOCARDIOGRAM  March 2013   EF 55%-60% with mild basilar inferoseptal hypokinesis also noted on LV-gram   Current Meds  Medication Sig  . amLODipine (NORVASC) 5 MG tablet TAKE 1 TABLET BY MOUTH  DAILY  . atorvastatin (LIPITOR) 40 MG tablet TAKE 1 TABLET BY MOUTH DAILY  . clopidogrel (PLAVIX) 75 MG tablet TAKE 1 TABLET BY MOUTH DAILY  . eszopiclone (LUNESTA) 1 MG TABS tablet Take 1 mg by mouth at bedtime as needed for sleep.  . finasteride (PROSCAR) 5 MG tablet Take 5 mg by mouth daily.  . fluticasone (FLONASE) 50 MCG/ACT nasal spray Place 1 spray into the nose daily as needed. For allergies  . nitroGLYCERIN (NITROSTAT) 0.4 MG SL tablet Place 1 tablet (0.4 mg total) under the tongue every 5 (five) minutes as needed for chest pain.  . [DISCONTINUED] lisinopril (PRINIVIL,ZESTRIL) 20 MG tablet TAKE 1 TABLET BY MOUTH DAILY    No Known Allergies   Social History   Social History  . Marital status: Married    Spouse name: N/A  . Number of children: 2  . Years of education: N/A   Occupational History  . 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 Smoking status: Never Smoker  . Smokeless tobacco: Never Used  . Alcohol use No  . Drug use: No  . Sexual activity: Not Currently  Other Topics Concern  . None   Social History Narrative   Married father of 2. Never smoked. Does not drink alcohol significantly.   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   He does play golf on a fairly regular basis, but is currently not walking between holes, but riding. He is starting to play with a new partner who likes to walk, so he is hoping to get more walking.   Family History  Problem Relation Age of Onset  . Heart failure Maternal Grandmother 91  . Cancer Paternal Grandmother 44  . Heart failure Paternal Grandfather 16  . Cancer - Colon Mother 74  . Heart attack Father 75    open heart surgery/valve replaced     Wt Readings from Last 3 Encounters:  05/14/16 92.3 kg (203 lb 6.4 oz)  02/19/16 90.5 kg (199 lb 7 oz)  09/20/15 90 kg (198 lb  6.4 oz)    PHYSICAL EXAM BP 116/76   Pulse 65   Ht 5\' 10"  (1.778 m)   Wt 92.3 kg (203 lb 6.4 oz)   BMI 29.18 kg/m  General appearance: alert, cooperative, appears stated age, no distress and Healthy-appearing, well-nourished/well-groomed. Present mood and affect Neck: no adenopathy, no carotid bruit, no JVD and supple, symmetrical, trachea midline Lungs: CTA B, normal percussion bilaterally and Nonlabored, good air movement Heart: RRR, S1, S2 normal, no murmur, click, rub or gallop and normal apical impulse Abdomen: soft, non-tender; bowel sounds normal; no masses, no organomegaly Extremities: extremities normal, atraumatic, no cyanosis or edema, no edema, redness or tenderness in the calves or thighs and no ulcers, gangrene or trophic changes Pulses: 2+ and symmetric Neurologic: A&OX 3, normal strength and tone. Normal symmetric reflexes. Normal coordination and gait    Adult ECG Report  Rate: 64 ;  Rhythm: normal sinus rhythm and 1 AVB; otherwise normal axis, voltage and durations. PR normal is 228  Narrative Interpretation: Stable EKG   Other studies Reviewed: Additional studies/ records that were reviewed today include:  Recent Labs:    Lab Results  Component Value Date   CHOL 133 05/09/2016   HDL 30 (L) 05/09/2016   LDLCALC 72 05/09/2016   TRIG 157 (H) 05/09/2016   CHOLHDL 4.4 05/09/2016   Lab Results  Component Value Date   CREATININE 0.86 05/09/2016   BUN 22 05/09/2016   NA 142 05/09/2016   K 4.4 05/09/2016   CL 104 05/09/2016   CO2 27 05/09/2016    ASSESSMENT / PLAN: Problem List Items Addressed This Visit    History of ST elevation myocardial infarction (STEMI) of inferior wall (Chronic)    Occluded RCA treated with DES stent. No large infarct on Myoview with preserved EF. However now having some coughing and fatigue/dyspnea symptoms we will check a 2-D echo just to make sure that there is no signs of heart failure. He had an MI in 2013 followed by 2015.       Relevant Medications   losartan (COZAAR) 25 MG tablet   Other Relevant Orders   EKG 12-Lead (Completed)   Essential hypertension (Chronic)    We have increased his amlodipine to 5 in the past, now he is having some fatigue as well as orthostatic dizziness  --we will therefore allow for mobility permissive hypertension. Switching from lisinopril to losartan for cough and using a lower dose.  Relevant Medications   losartan (COZAAR) 25 MG tablet   Other Relevant Orders   EKG 12-Lead (Completed)   CAD S/P percutaneous coronary angioplasty; therapeutic P2Y12 for Plavix - Primary (Chronic)    Overall seems to be stable. He has not had his recurrent anginal type symptoms, but this is noticing strange unusual symptoms. Continue Plavix without aspirin along with calcium channel blocker. Converted from ACE inhibitor to ARB. Not on beta blocker due to fatigue/depression and bradycardia.Marland Kitchen He is on stable dose of statin which were in hold for a month to see how this helps his fatigue.      Relevant Medications   losartan (COZAAR) 25 MG tablet   Other Relevant Orders   EKG 12-Lead (Completed)   Dyslipidemia, goal LDL below 70 (Chronic)    LDL is pretty close to goal last time, but with him having some fatigue, would like to just have him hold his statin for about a month. If there is no change in fatigue, but is not related to statin and he can to simply restart. However if there is a difference, I would symptomatically start back to half dose. We can simply follow back up labs in about 6 months.      Relevant Medications   losartan (COZAAR) 25 MG tablet   Fatigue due to treatment    [place] what is happening here. No longer on a beta blocker. At this point I think we'll try to give him a little more blood pressure room. I'm going to back down on his medication by switching to losartan 25 mg and lisinopril 10. This is partly because of his dry cough.  Some of his fatigue may be related  to not sleeping well. I think he may need to continue on with his Lunesta.      Relevant Orders   EKG 12-Lead (Completed)   ECHOCARDIOGRAM COMPLETE   Shortness of breath    Shortness of breath seems to go along with his coughing. Doesn't really sound like his PND and orthopnea, however we will check a 2-D echocardiogram just to confirm no signs of cardiomyopathy. Also help [place] figure of his volume status.      Relevant Orders   EKG 12-Lead (Completed)   ECHOCARDIOGRAM COMPLETE      Current medicines are reviewed at length with the patient today. (+/- concerns) When to stop Plavix for surgery. The following changes have been made:   Patient Instructions  Your physician has requested that you have an echocardiogram @ [address] - [address]rd [address]. Echocardiography is a painless test that uses sound waves to create images of your heart. It provides your doctor with information about the size and shape of your heart and how well your heart's chambers and valves are working. This procedure takes approximately one hour. There are no restrictions for this procedure.  Your physician has recommended you make the following change in your medication:  -- STOP lisinopril -- START losartan 25mg  once daily -- HOLD lipitor for ONE MONTH then restart (monitor symptoms)  Your physician recommends that you schedule a follow-up appointment in: 6-8 weeks with Dr. Herbie Baltimore       Studies Ordered:   Orders Placed This Encounter  Procedures  . EKG 12-Lead  . ECHOCARDIOGRAM COMPLETE      [name], M.D., M.S. Interventional Cardiologist   Pager # [phone] Phone # [phone] [address] [place], [place] [address]

## 2016-05-14 NOTE — Patient Instructions (Signed)
Your physician has requested that you have an echocardiogram @ [address] - [address]. Echocardiography is a painless test that uses sound waves to create images of your heart. It provides your doctor with information about the size and shape of your heart and how well your heart's chambers and valves are working. This procedure takes approximately one hour. There are no restrictions for this procedure.  Your physician has recommended you make the following change in your medication:  -- STOP lisinopril -- START losartan 25mg  once daily -- HOLD lipitor for ONE MONTH then restart (monitor symptoms)  Your physician recommends that you schedule a follow-up appointment in: 6-8 weeks with Dr. 

## 2016-05-15 ENCOUNTER — Encounter: Payer: Self-pay | Admitting: Cardiology

## 2016-05-15 NOTE — Assessment & Plan Note (Signed)
LDL is pretty close to goal last time, but with him having some fatigue, would like to just have him hold his statin for about a month. If there is no change in fatigue, but is not related to statin and he can to simply restart. However if there is a difference, I would symptomatically start back to half dose. We can simply follow back up labs in about 6 months.

## 2016-05-15 NOTE — Assessment & Plan Note (Signed)
Overall seems to be stable. He has not had his recurrent anginal type symptoms, but this is noticing strange unusual symptoms. Continue Plavix without aspirin along with calcium channel blocker. Converted from ACE inhibitor to ARB. Not on beta blocker due to fatigue/depression and bradycardia.Marland Kitchen He is on stable dose of statin which were in hold for a month to see how this helps his fatigue.

## 2016-05-15 NOTE — Assessment & Plan Note (Signed)
Occluded RCA treated with DES stent. No large infarct on Myoview with preserved EF. However now having some coughing and fatigue/dyspnea symptoms we will check a 2-D echo just to make sure that there is no signs of heart failure. He had an MI in 2013 followed by 2015.

## 2016-05-15 NOTE — Assessment & Plan Note (Signed)
Shortness of breath seems to go along with his coughing. Doesn't really sound like his PND and orthopnea, however we will check a 2-D echocardiogram just to confirm no signs of cardiomyopathy. Also help [place] figure of his volume status.

## 2016-05-15 NOTE — Assessment & Plan Note (Signed)
[place] what is happening here. No longer on a beta blocker. At this point I think we'll try to give him a little more blood pressure room. I'm going to back down on his medication by switching to losartan 25 mg and lisinopril 10. This is partly because of his dry cough.  Some of his fatigue may be related to not sleeping well. I think he may need to continue on with his Lunesta.

## 2016-05-15 NOTE — Assessment & Plan Note (Addendum)
We have increased his amlodipine to 5 in the past, now he is having some fatigue as well as orthostatic dizziness  --we will therefore allow for mobility permissive hypertension. Switching from lisinopril to losartan for cough and using a lower dose.

## 2016-05-17 ENCOUNTER — Telehealth: Payer: Self-pay | Admitting: *Deleted

## 2016-05-17 DIAGNOSIS — E785 Hyperlipidemia, unspecified: Secondary | ICD-10-CM

## 2016-05-17 DIAGNOSIS — Z79899 Other long term (current) drug therapy: Secondary | ICD-10-CM

## 2016-05-17 DIAGNOSIS — R7401 Elevation of levels of liver transaminase levels: Secondary | ICD-10-CM

## 2016-05-17 DIAGNOSIS — R740 Nonspecific elevation of levels of transaminase and lactic acid dehydrogenase [LDH]: Secondary | ICD-10-CM

## 2016-05-17 NOTE — Telephone Encounter (Signed)
-----   Message from Marykay Lex, MD sent at 05/10/2016  7:17 PM EST ----- Overall, chemistry panel looks pretty good. Kidney function looks pretty good. Glucose is low but high. Liver function has slightly elevated ALT and AST which we will need to monitor closely. Cholesterol panel shows much improved LDL from last year.  I think we can leave his statin dose as is. Would need to recheck LFTs and fasting lipid panel in 3 months.  [name], MD

## 2016-05-17 NOTE — Telephone Encounter (Signed)
Spoke to patient. Result given . Verbalized understanding   order placed for LFT's and fasting lipid  In 3 months will mail lab slip closer to the time

## 2016-05-31 HISTORY — PX: TRANSTHORACIC ECHOCARDIOGRAM: SHX275

## 2016-06-01 ENCOUNTER — Other Ambulatory Visit: Payer: Self-pay

## 2016-06-01 ENCOUNTER — Ambulatory Visit (HOSPITAL_COMMUNITY): Payer: Federal, State, Local not specified - PPO | Attending: Cardiovascular Disease

## 2016-06-01 DIAGNOSIS — E785 Hyperlipidemia, unspecified: Secondary | ICD-10-CM | POA: Diagnosis not present

## 2016-06-01 DIAGNOSIS — I2510 Atherosclerotic heart disease of native coronary artery without angina pectoris: Secondary | ICD-10-CM | POA: Insufficient documentation

## 2016-06-01 DIAGNOSIS — R0602 Shortness of breath: Secondary | ICD-10-CM | POA: Insufficient documentation

## 2016-06-01 DIAGNOSIS — I252 Old myocardial infarction: Secondary | ICD-10-CM | POA: Insufficient documentation

## 2016-06-01 DIAGNOSIS — R5383 Other fatigue: Secondary | ICD-10-CM

## 2016-06-01 DIAGNOSIS — I10 Essential (primary) hypertension: Secondary | ICD-10-CM | POA: Diagnosis not present

## 2016-06-13 ENCOUNTER — Telehealth: Payer: Self-pay | Admitting: *Deleted

## 2016-06-13 NOTE — Telephone Encounter (Signed)
Spoke to patient. Result given . Verbalized understanding  

## 2016-06-13 NOTE — Telephone Encounter (Signed)
Follow up ° ° °Pt calling back °

## 2016-06-13 NOTE — Telephone Encounter (Signed)
Left message to call back - in regards to echo report F/u appointment 07/12/16

## 2016-06-13 NOTE — Telephone Encounter (Signed)
-----   Message from Marykay Lex, MD sent at 06/11/2016  1:24 PM EDT ----- Good news. Echocardiogram was essentially normal with normal function and normal wall motion. Only some mild relaxation abnormality which is normal for age..  This would not explain shortness of breath.  [name], MD

## 2016-06-18 DIAGNOSIS — R202 Paresthesia of skin: Secondary | ICD-10-CM | POA: Diagnosis not present

## 2016-07-06 DIAGNOSIS — G629 Polyneuropathy, unspecified: Secondary | ICD-10-CM | POA: Diagnosis not present

## 2016-07-06 DIAGNOSIS — Z131 Encounter for screening for diabetes mellitus: Secondary | ICD-10-CM | POA: Diagnosis not present

## 2016-07-06 DIAGNOSIS — I219 Acute myocardial infarction, unspecified: Secondary | ICD-10-CM | POA: Diagnosis not present

## 2016-07-06 DIAGNOSIS — R5383 Other fatigue: Secondary | ICD-10-CM | POA: Diagnosis not present

## 2016-07-06 DIAGNOSIS — I10 Essential (primary) hypertension: Secondary | ICD-10-CM | POA: Diagnosis not present

## 2016-07-12 ENCOUNTER — Encounter: Payer: Self-pay | Admitting: Cardiology

## 2016-07-12 ENCOUNTER — Ambulatory Visit (INDEPENDENT_AMBULATORY_CARE_PROVIDER_SITE_OTHER): Payer: Federal, State, Local not specified - PPO | Admitting: Cardiology

## 2016-07-12 VITALS — BP 138/86 | HR 65 | Ht 70.0 in | Wt 206.6 lb

## 2016-07-12 DIAGNOSIS — I2510 Atherosclerotic heart disease of native coronary artery without angina pectoris: Secondary | ICD-10-CM | POA: Diagnosis not present

## 2016-07-12 DIAGNOSIS — Z9861 Coronary angioplasty status: Secondary | ICD-10-CM

## 2016-07-12 DIAGNOSIS — R5383 Other fatigue: Secondary | ICD-10-CM

## 2016-07-12 DIAGNOSIS — I209 Angina pectoris, unspecified: Secondary | ICD-10-CM | POA: Diagnosis not present

## 2016-07-12 DIAGNOSIS — R0602 Shortness of breath: Secondary | ICD-10-CM | POA: Diagnosis not present

## 2016-07-12 DIAGNOSIS — I10 Essential (primary) hypertension: Secondary | ICD-10-CM

## 2016-07-12 DIAGNOSIS — E785 Hyperlipidemia, unspecified: Secondary | ICD-10-CM | POA: Diagnosis not present

## 2016-07-12 DIAGNOSIS — I739 Peripheral vascular disease, unspecified: Secondary | ICD-10-CM | POA: Diagnosis not present

## 2016-07-12 MED ORDER — CO Q-10 300 MG PO CAPS: 300.0000 mg | ORAL_CAPSULE | Freq: Every day | ORAL | 11 refills | Status: DC

## 2016-07-12 NOTE — Progress Notes (Signed)
PCP: [name], MD  Clinic Note: Chief Complaint  Patient presents with  . Follow-up    fatigue  . Coronary Artery Disease    HPI: [name] is a 61 y.o. male with a PMH below who presents today for 2 month follow-up for CAD and history of inferior MI with PCI to the RCA in 2013.  Crescendo angina in July 2015 with an abnormal Myoview lead to findings severe mid LAD disease treated with DES stent. Also had D1 and circumflex disease evaluated with FFR --> nonsignificant. Medical therapy.  [name] was last seen on 05/14/2016. He had just retired about 10 months before that. He wasn't noticing twinges of chest discomfort and having. Still having a little malaise but relatively stable. He was describing coughing symptoms associated with chest pressure and palpitations.  Recent Hospitalizations: Nov 2017 -- ER visit with diverticulitis  Studies Personally Reviewed:   2-D Echocardiogram March 2018: Essentially normal. Normal LV size with mild concentric LVH. EF 55% no regional wall motion abnormality. GR 1 DD. Mild LA dilation.  Interval History: [name] returns today actually doing okay, but still notes fatigue. He talked to his physician about dry cough and was switched to losartan, but he still has little bit of a dry cough. Not as much. The palpitations he was having before much improved. The biggest complaints he has R fatigue and peripheral neuropathy type sensations. He has leg aching and dullness of fatigue with walking. He isn't noticing much of the pressure/twinging sensation that he was having his chest.   He denies any exertional chest tightness or pressure. No PND, orthopnea or edema. Minimal palpitations, but no lightheadedness, dizziness, weakness or syncope/near syncope. No TIA/amaurosis fugax symptoms. No melena, hematochezia, hematuria, or epstaxis. No claudication.  ROS: A comprehensive was performed. Review of Systems  Constitutional: Positive for  malaise/fatigue (A little better last time, still having problems with fatigue.).  HENT: Negative for congestion and nosebleeds.   Respiratory: Positive for cough (Notably improved).   Gastrointestinal: Positive for heartburn (With gurgling). Negative for blood in stool and melena.  Genitourinary: Negative for hematuria.  Musculoskeletal: Positive for joint pain (Mild arthritis pains) and myalgias (His legs ache).  Psychiatric/Behavioral: The patient has insomnia (Still not sleeping well).   All other systems reviewed and are negative.   Past Medical History:  Diagnosis Date  . Abnormal nuclear stress test 10/21/2013   Intermediate Risk - septal, anterolateral ischemia --> cath with LAD, diagonal and circumflex disease.   Marland Kitchen Anxiety   . Arthritis    "mostly in my hands; sometimes other areas" (10/20/2013)  . Bipolar disorder (HCC)   . BPH (benign prostatic hyperplasia)   . CAD S/P percutaneous coronary angioplasty 06/2011, 09/2013   (Therapeutic P2Y12 on Plavix);; a) STEMI -mid RCA DES PCI; 2015: Class III angina (abnormal stress test with anterior- lateral ischemia -> mid-distal LAD 95-99% (PCI: Biotronik AG 2 DES 2.5 x 13, 2.75 mm), D1 proximal 50-60% (FFR 0.6) with 60-70% in small branch (med management), mid circumflex 50-60% (FFR 1.0)   . Depression   . Dizziness of unknown cause January 2011   abn MRI, Neuro w/u Jan 2011  . Dyslipidemia, goal LDL below 70   . Essential hypertension   . GERD (gastroesophageal reflux disease)   . RA (rheumatoid arthritis) (HCC)   . Rheumatic fever 1969  . ST elevation myocardial infarction (STEMI) involving right coronary artery in recovery phase (HCC) 06/2011   100% occluded RCA; PCI -  3.0x24 Promus DES (3.5-3.6 mm) ; Modwith moderate LAD & Cx disease, EF 55-60% w/ basal inferoseptal HK confirmed by echo    Past Surgical History:  Procedure Laterality Date  . LEFT HEART CATHETERIZATION WITH CORONARY ANGIOGRAM N/A 06/28/2011   Procedure: LEFT HEART  CATHETERIZATION WITH CORONARY ANGIOGRAM;  Surgeon: Marykay Lex, MD;  Location: [facility] CATH LAB;  Service: Cardiovascular;  Laterality: N/A;  . LEFT HEART CATHETERIZATION WITH CORONARY ANGIOGRAM N/A 10/20/2013   Procedure: LEFT HEART CATHETERIZATION WITH CORONARY ANGIOGRAM;  Surgeon: Marykay Lex, MD;  Location: [facility] CATH LAB;  Service: Cardiovascular;  Laterality: N/A;  . NM MYOVIEW LTD  10/19/2013   INTERMEDIATE RISK, septal, anterior-anterolateral ischemia --> referred for cath the following day  . PERCUTANEOUS CORONARY STENT INTERVENTION (PCI-S)  06/2011; 09/2013   a) 3/'13: 100%  RCA ->PCI 3.20x2 (3.6) Promus DES stent; 7/'15: mLAD PCI Biotronik AG DES 2.5 x13 2.75 mm), FFR D1 50-60% = 0.86 (small branch had ~70%), mCx ~50-60% FFR=1.0.  . PLANTAR FASCIA SURGERY Left 2004   "& neuroma"  . SHOULDER ARTHROSCOPY W/ ROTATOR CUFF REPAIR Left July 2011  . TRANSTHORACIC ECHOCARDIOGRAM  March 2013   EF 55%-60% with mild basilar inferoseptal hypokinesis also noted on LV-gram    Current Meds  Medication Sig  . amLODipine (NORVASC) 5 MG tablet TAKE 1 TABLET BY MOUTH DAILY  . atorvastatin (LIPITOR) 40 MG tablet TAKE 1 TABLET BY MOUTH DAILY  . clopidogrel (PLAVIX) 75 MG tablet TAKE 1 TABLET BY MOUTH DAILY  . eszopiclone (LUNESTA) 1 MG TABS tablet Take 1 mg by mouth at bedtime as needed for sleep.  . finasteride (PROSCAR) 5 MG tablet Take 5 mg by mouth daily.  . fluticasone (FLONASE) 50 MCG/ACT nasal spray Place 1 spray into the nose daily as needed. For allergies  . gabapentin (NEURONTIN) 300 MG capsule Take 1 capsule by mouth 3 (three) times daily.  Marland Kitchen losartan (COZAAR) 25 MG tablet Take 1 tablet (25 mg total) by mouth daily.  . nitroGLYCERIN (NITROSTAT) 0.4 MG SL tablet Place 1 tablet (0.4 mg total) under the tongue every 5 (five) minutes as needed for chest pain.    No Known Allergies  Social History   Social History  . Marital status: Married    Spouse name: N/A  . Number of children: 2  .  Years of education: N/A   Occupational History  . 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 Smoking status: Never Smoker  . Smokeless tobacco: Never Used  . Alcohol use No  . Drug use: No  . Sexual activity: Not Currently   Other Topics Concern  . None   Social History Narrative   Married father of 2. Never smoked. Does not drink alcohol significantly.   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   He does play golf on a fairly regular basis, but is currently not walking between holes, but riding. He is starting to play with a new partner who likes to walk, so he is hoping to get more walking.    family history includes Cancer (age of onset: 74) in his paternal grandmother; Cancer - Colon (age of onset: 28) in his mother; Heart attack (age of onset: 37) in his father; Heart failure (age of onset: 57) in his paternal grandfather; Heart failure (age  of onset: 50) in his maternal grandmother.  Wt Readings from Last 3 Encounters:  07/12/16 206 lb 9.6 oz (93.7 kg)  05/14/16 203 lb 6.4 oz (92.3 kg)  02/19/16 199 lb 7 oz (90.5 kg)    PHYSICAL EXAM BP 138/86   Pulse 65   Ht 5\' 10"  (1.778 m)   Wt 206 lb 9.6 oz (93.7 kg)   SpO2 98%   BMI 29.64 kg/m  General appearance: alert, cooperative, appears stated age, no distress and Borderline obese. Healthy-appearing. Somewhat blunted mood and affect. HEENT: [place]/AT, EOMI, MMM, anicteric sclera Neck: no adenopathy, no carotid bruit and no JVD Lungs: clear to auscultation bilaterally, normal percussion bilaterally and non-labored Heart: regular rate and rhythm, S1 & S2 normal, no murmur, click, rub or gallop ; nondisplaced PMI Abdomen: soft, non-tender; bowel sounds normal; no masses,  no organomegaly; no HJR Extremities: extremities normal, atraumatic, no cyanosis, or edema  Pulses: 2+ and  symmetric;  Skin: mobility and turgor normal, no edema, no evidence of bleeding or bruising and no lesions noted  Neurologic: Mental status: Alert, oriented, thought content appropriate    Adult ECG Report n/a  Other studies Reviewed: Additional studies/ records that were reviewed today include:  Recent Labs:   Lab Results  Component Value Date   CHOL 133 05/09/2016   HDL 30 (L) 05/09/2016   LDLCALC 72 05/09/2016   TRIG 157 (H) 05/09/2016   CHOLHDL 4.4 05/09/2016   Lab Results  Component Value Date   CREATININE 0.86 05/09/2016   BUN 22 05/09/2016   NA 142 05/09/2016   K 4.4 05/09/2016   CL 104 05/09/2016   CO2 27 05/09/2016    ASSESSMENT / PLAN: Problem List Items Addressed This Visit    Angina, class II - decreased exercise tolerance, dyspnea & fatigue    No longer having anginal Sx since LAD PCI.  Has intermittent atypical chest symptoms that do not sound anginal  He does have dyspnea - normal Echo.      CAD S/P percutaneous coronary angioplasty; therapeutic P2Y12 for Plavix (Chronic)    I do not think that fatigue is related to active CAD Sx b/c he has had classic angina Sx before. On Plavix & CCB. Not on BB due to fatigue & bradycardia as well as my true concerns re: baseline depression Sx. On statin again - no change in fatigue level. Converted to ARB from ACE-I, but still has a cough.      Relevant Orders   VAS [place] ABI WITH/WO TBI   VAS [place] LOWER EXTREMITY ARTERIAL DUPLEX   Claudication (HCC)    Truthfully, it is very difficult to decipher his Sx - I suspect that he may indeed have PAD.  Plan check LEA Dopplers.      Relevant Orders   VAS [place] ABI WITH/WO TBI   VAS [place] LOWER EXTREMITY ARTERIAL DUPLEX   Dyslipidemia, goal LDL below 70 (Chronic)    Close to goal --> reassess in ~6 months. Continue current Rx.      Essential hypertension (Chronic)    Borderline BP control @ present -  With on-going cough, cannot exclude crossreactivity with Losartan &  ACE-I.  Change to Diovan. Continue amlodipine.      Fatigue due to treatment - Primary    Again - very hard to tell what is happening -- Echo is otherwise normal. Not on typical offenders. I feel that a good deal of his Sx are related to depression/ dysthymia Sx. I recommended that  he follow back up with his Psychiatrist.      Relevant Orders   VAS [place] ABI WITH/WO TBI   VAS [place] LOWER EXTREMITY ARTERIAL DUPLEX   Shortness of breath   Relevant Orders   VAS [place] ABI WITH/WO TBI   VAS [place] LOWER EXTREMITY ARTERIAL DUPLEX      I also provided assistance with managing his allergy & GERD symptoms. -- see below.  Current medicines are reviewed at length with the patient today. (+/- concerns) n/a The following changes have been made:   Patient Instructions  SCHEDULE [address] Your physician has requested that you have an ankle brachial index (ABI). During this test an ultrasound and blood pressure cuff are used to evaluate the arteries that supply the arms and legs with blood. Allow thirty minutes for this exam. There are no restrictions or special instructions.  Your physician has requested that you have a lower  extremity arterial duplex. This test is an ultrasound of the arteries in the legs. It looks at arterial blood flow in the leg. Allow one hour for Lower Arterial scans. There are no restrictions or special instructions  MEDICATION --START CoQ10 30 0MG  - start 100 mg daily an increase by 100 mg every 2 weeks until reach 300 mg daily.  -stop taking losartan for 2 weeks    If cough not better restart medication and try pepcid and claritin over the counter if it gets better - issue is reflux    If cough gets better after 2 weeks -call office for a prescription for new medications   Your physician wants you to follow-up in 6 months with DR Male Iafrate. You will receive a reminder letter in the mail two months in advance. If you don't receive a letter, please call our  office to schedule the follow-up appointment.    If you need a refill on your cardiac medications before your next appointment, please call your pharmacy.    Studies Ordered:   No orders of the defined types were placed in this encounter.     , M.D., M.S. Interventional Cardiologist   Pager # [phone] Phone # [phone] [address] [place], [place]

## 2016-07-12 NOTE — Patient Instructions (Signed)
SCHEDULE [address] Your physician has requested that you have an ankle brachial index (ABI). During this test an ultrasound and blood pressure cuff are used to evaluate the arteries that supply the arms and legs with blood. Allow thirty minutes for this exam. There are no restrictions or special instructions.  Your physician has requested that you have a lower  extremity arterial duplex. This test is an ultrasound of the arteries in the legs. It looks at arterial blood flow in the leg. Allow one hour for Lower Arterial scans. There are no restrictions or special instructions  MEDICATION --START CoQ10 30 0MG  - start 100 mg daily an increase by 100 mg every 2 weeks until reach 300 mg daily.  -stop taking losartan for 2 weeks    If cough not better restart medication and try pepcid and claritin over the counter if it gets better - issue is reflux    If cough gets better after 2 weeks -call office for a prescription for new medications   Your physician wants you to follow-up in 6 months with DR HARDING. You will receive a reminder letter in the mail two months in advance. If you don't receive a letter, please call our office to schedule the follow-up appointment.    If you need a refill on your cardiac medications before your next appointment, please call your pharmacy.

## 2016-07-14 ENCOUNTER — Encounter: Payer: Self-pay | Admitting: Cardiology

## 2016-07-14 DIAGNOSIS — I739 Peripheral vascular disease, unspecified: Secondary | ICD-10-CM | POA: Insufficient documentation

## 2016-07-14 NOTE — Assessment & Plan Note (Signed)
Close to goal --> reassess in ~6 months. Continue current Rx.

## 2016-07-14 NOTE — Assessment & Plan Note (Signed)
I do not think that fatigue is related to active CAD Sx b/c he has had classic angina Sx before. On Plavix & CCB. Not on BB due to fatigue & bradycardia as well as my true concerns re: baseline depression Sx. On statin again - no change in fatigue level. Converted to ARB from ACE-I, but still has a cough.

## 2016-07-14 NOTE — Assessment & Plan Note (Signed)
Borderline BP control @ present -  With on-going cough, cannot exclude crossreactivity with Losartan & ACE-I.  Change to Diovan. Continue amlodipine.

## 2016-07-14 NOTE — Assessment & Plan Note (Signed)
No longer having anginal Sx since LAD PCI.  Has intermittent atypical chest symptoms that do not sound anginal  He does have dyspnea - normal Echo.

## 2016-07-14 NOTE — Assessment & Plan Note (Signed)
Again - very hard to tell what is happening -- Echo is otherwise normal. Not on typical offenders. I feel that a good deal of his Sx are related to depression/ dysthymia Sx. I recommended that he follow back up with his Psychiatrist.

## 2016-07-14 NOTE — Assessment & Plan Note (Signed)
Truthfully, it is very difficult to decipher his Sx - I suspect that he may indeed have PAD.  Plan check LEA Dopplers.

## 2016-07-27 ENCOUNTER — Telehealth: Payer: Self-pay | Admitting: *Deleted

## 2016-07-27 ENCOUNTER — Other Ambulatory Visit: Payer: Self-pay | Admitting: *Deleted

## 2016-07-27 DIAGNOSIS — E785 Hyperlipidemia, unspecified: Secondary | ICD-10-CM

## 2016-07-27 DIAGNOSIS — Z79899 Other long term (current) drug therapy: Secondary | ICD-10-CM

## 2016-07-27 NOTE — Telephone Encounter (Signed)
MAIL LETTER AND LABSLIP 

## 2016-07-27 NOTE — Telephone Encounter (Signed)
-----   Message from Tobin Chad, RN sent at 05/17/2016  4:16 PM EST ----- Labs due may 15 ,2018  LFT'S and lipid  [facility] Mail lab slip July 16 2016

## 2016-08-02 ENCOUNTER — Other Ambulatory Visit: Payer: Self-pay | Admitting: Cardiology

## 2016-08-02 DIAGNOSIS — G2581 Restless legs syndrome: Secondary | ICD-10-CM | POA: Diagnosis not present

## 2016-08-02 DIAGNOSIS — G4761 Periodic limb movement disorder: Secondary | ICD-10-CM | POA: Diagnosis not present

## 2016-08-02 DIAGNOSIS — R0681 Apnea, not elsewhere classified: Secondary | ICD-10-CM | POA: Diagnosis not present

## 2016-08-02 DIAGNOSIS — I739 Peripheral vascular disease, unspecified: Secondary | ICD-10-CM

## 2016-08-02 DIAGNOSIS — G4700 Insomnia, unspecified: Secondary | ICD-10-CM | POA: Diagnosis not present

## 2016-08-03 ENCOUNTER — Ambulatory Visit (HOSPITAL_COMMUNITY)
Admission: RE | Admit: 2016-08-03 | Discharge: 2016-08-03 | Disposition: A | Discharge status: Home / Self Care | Payer: Federal, State, Local not specified - PPO | Source: Ambulatory Visit | Attending: Cardiovascular Disease | Admitting: Cardiovascular Disease

## 2016-08-03 DIAGNOSIS — I10 Essential (primary) hypertension: Secondary | ICD-10-CM | POA: Insufficient documentation

## 2016-08-03 DIAGNOSIS — R5383 Other fatigue: Secondary | ICD-10-CM

## 2016-08-03 DIAGNOSIS — I739 Peripheral vascular disease, unspecified: Secondary | ICD-10-CM | POA: Insufficient documentation

## 2016-08-03 DIAGNOSIS — E785 Hyperlipidemia, unspecified: Secondary | ICD-10-CM | POA: Diagnosis not present

## 2016-08-03 DIAGNOSIS — R0602 Shortness of breath: Secondary | ICD-10-CM

## 2016-08-03 DIAGNOSIS — Z9861 Coronary angioplasty status: Secondary | ICD-10-CM

## 2016-08-03 DIAGNOSIS — I2510 Atherosclerotic heart disease of native coronary artery without angina pectoris: Secondary | ICD-10-CM | POA: Diagnosis not present

## 2016-08-08 ENCOUNTER — Telehealth: Payer: Self-pay | Admitting: *Deleted

## 2016-08-08 NOTE — Telephone Encounter (Signed)
Left message on secure- detailed message result left on voice mail per dpi

## 2016-08-08 NOTE — Telephone Encounter (Signed)
-----   Message from Marykay Lex, MD sent at 08/07/2016 10:02 PM EDT ----- Normal lower summary artery Dopplers. No sign of significant blockages.  [name], MD

## 2016-08-17 DIAGNOSIS — K5792 Diverticulitis of intestine, part unspecified, without perforation or abscess without bleeding: Secondary | ICD-10-CM | POA: Diagnosis not present

## 2016-08-20 DIAGNOSIS — G4733 Obstructive sleep apnea (adult) (pediatric): Secondary | ICD-10-CM | POA: Diagnosis not present

## 2016-08-21 DIAGNOSIS — G4733 Obstructive sleep apnea (adult) (pediatric): Secondary | ICD-10-CM | POA: Diagnosis not present

## 2016-08-29 DIAGNOSIS — G4733 Obstructive sleep apnea (adult) (pediatric): Secondary | ICD-10-CM | POA: Diagnosis not present

## 2016-09-04 ENCOUNTER — Other Ambulatory Visit: Payer: Self-pay | Admitting: Cardiology

## 2016-09-29 DIAGNOSIS — G4733 Obstructive sleep apnea (adult) (pediatric): Secondary | ICD-10-CM | POA: Diagnosis not present

## 2016-10-04 ENCOUNTER — Other Ambulatory Visit: Payer: Self-pay | Admitting: Cardiology

## 2016-10-04 NOTE — Telephone Encounter (Signed)
E sent to pharmacy 

## 2016-10-09 DIAGNOSIS — I10 Essential (primary) hypertension: Secondary | ICD-10-CM | POA: Diagnosis not present

## 2016-10-09 DIAGNOSIS — Z79899 Other long term (current) drug therapy: Secondary | ICD-10-CM | POA: Diagnosis not present

## 2016-10-09 DIAGNOSIS — I2510 Atherosclerotic heart disease of native coronary artery without angina pectoris: Secondary | ICD-10-CM | POA: Diagnosis not present

## 2016-10-09 DIAGNOSIS — Z9109 Other allergy status, other than to drugs and biological substances: Secondary | ICD-10-CM | POA: Diagnosis not present

## 2016-10-09 DIAGNOSIS — G629 Polyneuropathy, unspecified: Secondary | ICD-10-CM | POA: Diagnosis not present

## 2016-10-22 DIAGNOSIS — G4733 Obstructive sleep apnea (adult) (pediatric): Secondary | ICD-10-CM | POA: Diagnosis not present

## 2016-10-29 DIAGNOSIS — G4733 Obstructive sleep apnea (adult) (pediatric): Secondary | ICD-10-CM | POA: Diagnosis not present

## 2016-11-29 DIAGNOSIS — G4733 Obstructive sleep apnea (adult) (pediatric): Secondary | ICD-10-CM | POA: Diagnosis not present

## 2016-12-04 DIAGNOSIS — H5203 Hypermetropia, bilateral: Secondary | ICD-10-CM | POA: Diagnosis not present

## 2016-12-04 DIAGNOSIS — H47233 Glaucomatous optic atrophy, bilateral: Secondary | ICD-10-CM | POA: Diagnosis not present

## 2016-12-11 ENCOUNTER — Other Ambulatory Visit: Payer: Self-pay | Admitting: Cardiology

## 2016-12-11 NOTE — Telephone Encounter (Signed)
REFILL 

## 2016-12-30 DIAGNOSIS — G4733 Obstructive sleep apnea (adult) (pediatric): Secondary | ICD-10-CM | POA: Diagnosis not present

## 2017-01-08 DIAGNOSIS — I10 Essential (primary) hypertension: Secondary | ICD-10-CM | POA: Diagnosis not present

## 2017-01-08 DIAGNOSIS — G629 Polyneuropathy, unspecified: Secondary | ICD-10-CM | POA: Diagnosis not present

## 2017-01-08 DIAGNOSIS — I2510 Atherosclerotic heart disease of native coronary artery without angina pectoris: Secondary | ICD-10-CM | POA: Diagnosis not present

## 2017-01-08 DIAGNOSIS — R7303 Prediabetes: Secondary | ICD-10-CM | POA: Diagnosis not present

## 2017-01-08 DIAGNOSIS — G8929 Other chronic pain: Secondary | ICD-10-CM | POA: Diagnosis not present

## 2017-01-08 DIAGNOSIS — M545 Low back pain: Secondary | ICD-10-CM | POA: Diagnosis not present

## 2017-01-08 DIAGNOSIS — R0602 Shortness of breath: Secondary | ICD-10-CM | POA: Diagnosis not present

## 2017-01-10 ENCOUNTER — Encounter: Payer: Self-pay | Admitting: Cardiology

## 2017-01-10 ENCOUNTER — Ambulatory Visit (INDEPENDENT_AMBULATORY_CARE_PROVIDER_SITE_OTHER): Payer: Federal, State, Local not specified - PPO | Admitting: Cardiology

## 2017-01-10 VITALS — BP 138/84 | HR 74 | Ht 70.0 in | Wt 205.2 lb

## 2017-01-10 DIAGNOSIS — Z9861 Coronary angioplasty status: Secondary | ICD-10-CM

## 2017-01-10 DIAGNOSIS — E785 Hyperlipidemia, unspecified: Secondary | ICD-10-CM

## 2017-01-10 DIAGNOSIS — I209 Angina pectoris, unspecified: Secondary | ICD-10-CM | POA: Diagnosis not present

## 2017-01-10 DIAGNOSIS — I2510 Atherosclerotic heart disease of native coronary artery without angina pectoris: Secondary | ICD-10-CM

## 2017-01-10 DIAGNOSIS — I10 Essential (primary) hypertension: Secondary | ICD-10-CM

## 2017-01-10 NOTE — Assessment & Plan Note (Addendum)
Doing well since PCI ot LAD in 2015.  Has atypical / non-anginal chest pain /twinges off & on, but not much since last visit. F/u Echo with no RWMA - indicates minimal infarct from initial Inf STEMI.  On Plavix alone along with amlodipine

## 2017-01-10 NOTE — Assessment & Plan Note (Signed)
Blood pressures still controlled, at the top of normal range. On amlodipine and losartan. We have converted him to Diovan (but this was reversed because of the Diovan recall) - since he is not having any further cough, we could increase losartan if pressures continue to be elevated.

## 2017-01-10 NOTE — Progress Notes (Signed)
PCP: Philemon Kingdom, MD  Clinic Note: Chief Complaint  Patient presents with  . Follow-up    SOB  . Coronary Artery Disease    HPI: [name] is a 61 y.o. male with a PMH below who presents today for 2 month follow-up for CAD and history of inferior MI with PCI to the RCA in 2013.  Crescendo angina in July 2015 with an abnormal Myoview lead to findings severe mid LAD disease treated with DES stent. Also had D1 and circumflex disease evaluated with FFR --> nonsignificant. Medical therapy.  [name] was last seen in April 2018. He is doing quite well just noting some fatigue, and also dry cough. Apparently it's been switched over to losartan still had dry cough. Otherwise no chest pain. -- 2 month follow-up from an episode where he was noticing twinging of chest pain. This was about a year out from his retirement.  Recent Hospitalizations: Nov 2017 -- ER visit with diverticulitis  Studies Personally Reviewed:   Lower extremity arterial Dopplers done in May 2018 showed no blockages.  Interval History: [name] returns today actually doing okay, just that he is a bit concerned about his most recent Hgb A1c score of ~7.  He is now planning to make a concerted effort to actually do the exercise that he has been saying he will do.  He is limited by his foot/leg pain/numbness -- probably related to neuropathy, so he just dreads doing anything.  It takes several hours to recover from playing a round of golf.  With the exercise he does, he denies any angina, but may get a bit "winded" if he exerts more than usual. He also still notes feeling fatigue - but that is better. Although his feet & legs hurt after activity, he really does not note it during activity, no claudication.  Cardiovascular ROS: positive for - dyspnea on exertion negative for - chest pain, edema, irregular heartbeat, loss of consciousness, murmur, orthopnea, palpitations, paroxysmal nocturnal dyspnea, rapid heart  rate, shortness of breath or TIA/amaurosis fugax symptoms, syncope/near syncope.   ROS: A comprehensive was performed. Review of Systems  Constitutional: Positive for malaise/fatigue (A little better last time, still having problems with fatigue.).  HENT: Negative for congestion and nosebleeds.   Respiratory: Negative for cough (Notably improved).   Gastrointestinal: Negative for abdominal pain, blood in stool, heartburn (better) and melena.  Genitourinary: Negative for hematuria.  Musculoskeletal: Positive for joint pain (Mild arthritis pains) and myalgias (His legs ache).  Neurological: Positive for tingling (numbness in feet &legs - usually after activity).  Psychiatric/Behavioral: The patient has insomnia (Still not sleeping well).   All other systems reviewed and are negative.   Past Medical History:  Diagnosis Date  . Abnormal nuclear stress test 10/21/2013   Intermediate Risk - septal, anterolateral ischemia --> cath with LAD, diagonal and circumflex disease.   Marland Kitchen Anxiety   . Arthritis    "mostly in my hands; sometimes other areas" (10/20/2013)  . Bipolar disorder (HCC)   . BPH (benign prostatic hyperplasia)   . CAD S/P percutaneous coronary angioplasty 06/2011, 09/2013   (Therapeutic P2Y12 on Plavix);; a) STEMI -mid RCA DES PCI; 2015: Class III angina (abnormal stress test with anterior- lateral ischemia -> mid-distal LAD 95-99% (PCI: Biotronik AG 2 DES 2.5 x 13, 2.75 mm), D1 proximal 50-60% (FFR 0.6) with 60-70% in small branch (med management), mid circumflex 50-60% (FFR 1.0)   . Depression   . Dizziness of unknown cause January 2011  abn MRI, Neuro w/u Jan 2011  . Dyslipidemia, goal LDL below 70   . Essential hypertension   . GERD (gastroesophageal reflux disease)   . RA (rheumatoid arthritis) (HCC)   . Rheumatic fever 1969  . ST elevation myocardial infarction (STEMI) involving right coronary artery in recovery phase (HCC) 06/2011   100% occluded RCA; PCI - 3.0x24 Promus  DES (3.5-3.6 mm) ; Modwith moderate LAD & Cx disease, EF 55-60% w/ basal inferoseptal HK confirmed by echo    Past Surgical History:  Procedure Laterality Date  . LEFT HEART CATHETERIZATION WITH CORONARY ANGIOGRAM N/A 06/28/2011   Procedure: LEFT HEART CATHETERIZATION WITH CORONARY ANGIOGRAM;  Surgeon: Marykay Lex, MD;  Location: [facility] CATH LAB;  Service: Cardiovascular;  Laterality: N/A;  . LEFT HEART CATHETERIZATION WITH CORONARY ANGIOGRAM N/A 10/20/2013   Procedure: LEFT HEART CATHETERIZATION WITH CORONARY ANGIOGRAM;  Surgeon: Marykay Lex, MD;  Location: [facility] CATH LAB;  Service: Cardiovascular;  Laterality: N/A;  . NM MYOVIEW LTD  10/19/2013   INTERMEDIATE RISK, septal, anterior-anterolateral ischemia --> referred for cath the following day  . PERCUTANEOUS CORONARY STENT INTERVENTION (PCI-S)  06/2011; 09/2013   a) 3/'13: 100%  RCA ->PCI 3.20x2 (3.6) Promus DES stent; 7/'15: mLAD PCI Biotronik AG DES 2.5 x13 2.75 mm), FFR D1 50-60% = 0.86 (small branch had ~70%), mCx ~50-60% FFR=1.0.  . PLANTAR FASCIA SURGERY Left 2004   "& neuroma"  . SHOULDER ARTHROSCOPY W/ ROTATOR CUFF REPAIR Left July 2011  . TRANSTHORACIC ECHOCARDIOGRAM  05/2016   EF ~55%. Normal LV Size & function. NO RWMA (inferior HK no longer present). Mlid LVH - Gr 1 DD.. Mild LA dilation.    Current Meds  Medication Sig  . amLODipine (NORVASC) 5 MG tablet TAKE 1 TABLET BY MOUTH ONCE DAILY.  Marland Kitchen atorvastatin (LIPITOR) 40 MG tablet TAKE 1 TABLET BY MOUTH DAILY  . clopidogrel (PLAVIX) 75 MG tablet TAKE 1 TABLET BY MOUTH DAILY  . Coenzyme Q10 (CO Q-10) 300 MG CAPS Take 300 mg by mouth daily.  Marland Kitchen esomeprazole (NEXIUM) 20 MG capsule Take 20 mg by mouth daily at 12 noon.  . finasteride (PROSCAR) 5 MG tablet Take 5 mg by mouth daily.  . fluticasone (FLONASE) 50 MCG/ACT nasal spray Place 1 spray into the nose daily as needed. For allergies  . gabapentin (NEURONTIN) 600 MG tablet Take 1 tablet by mouth 3 (three) times daily.  .  nitroGLYCERIN (NITROSTAT) 0.4 MG SL tablet Place 1 tablet (0.4 mg total) under the tongue every 5 (five) minutes as needed for chest pain.    No Known Allergies  Social History   Social History  . Marital status: Married    Spouse name: N/A  . Number of children: 2  . Years of education: N/A   Occupational History  . 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 Smoking status: Never Smoker  . Smokeless tobacco: Never Used  . Alcohol use No  . Drug use: No  . Sexual activity: Not Currently   Other Topics Concern  . None   Social History Narrative   Married father of 2. Never smoked. Does not drink alcohol significantly.   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   He does play golf on a fairly regular basis, but is currently not walking between holes, but  riding. He is starting to play with a new partner who likes to walk, so he is hoping to get more walking.    family history includes Cancer (age of onset: 51) in his paternal grandmother; Cancer - Colon (age of onset: 70) in his mother; Heart attack (age of onset: 64) in his father; Heart failure (age of onset: 59) in his paternal grandfather; Heart failure (age of onset: 69) in his maternal grandmother.  Wt Readings from Last 3 Encounters:  01/10/17 205 lb 3.2 oz (93.1 kg)  07/12/16 206 lb 9.6 oz (93.7 kg)  05/14/16 203 lb 6.4 oz (92.3 kg)    PHYSICAL EXAM BP 138/84   Pulse 74   Ht 5\' 10"  (1.778 m)   Wt 205 lb 3.2 oz (93.1 kg)   SpO2 99%   BMI 29.44 kg/m   Physical Exam  Constitutional: He is oriented to person, place, and time. He appears well-developed and well-nourished. No distress.  HENT:  Head: Normocephalic and atraumatic.  Eyes: EOM are normal.  Neck: Normal range of motion. Neck supple. No hepatojugular reflux and no JVD present. Carotid bruit is not present.    Cardiovascular: Normal rate, normal heart sounds and intact distal pulses.   No extrasystoles are present. PMI is not displaced.  Exam reveals no gallop and no friction rub.   No murmur heard. Pulmonary/Chest: Effort normal and breath sounds normal. No respiratory distress. He has no wheezes. He has no rales.  Abdominal: Soft. Bowel sounds are normal. He exhibits no distension. There is no tenderness. There is no rebound.  Musculoskeletal: Normal range of motion. He exhibits no edema.  Neurological: He is alert and oriented to person, place, and time.  Skin: Skin is warm and dry. No rash noted. No erythema.  Psychiatric: He has a normal mood and affect. His behavior is normal. Judgment and thought content normal.  Nursing note and vitals reviewed.   Adult ECG Report n/a  Other studies Reviewed: Additional studies/ records that were reviewed today include:  Recent Labs:   Lab Results  Component Value Date   CHOL 133 05/09/2016   HDL 30 (L) 05/09/2016   LDLCALC 72 05/09/2016   TRIG 157 (H) 05/09/2016   CHOLHDL 4.4 05/09/2016   Lab Results  Component Value Date   CREATININE 0.86 05/09/2016   BUN 22 05/09/2016   NA 142 05/09/2016   K 4.4 05/09/2016   CL 104 05/09/2016   CO2 27 05/09/2016    ASSESSMENT / PLAN: Problem List Items Addressed This Visit    Angina, class II - decreased exercise tolerance, dyspnea & fatigue (Chronic)    Doing well since PCI ot LAD in 2015.  Has atypical / non-anginal chest pain /twinges off & on, but not much since last visit. F/u Echo with no RWMA - indicates minimal infarct from initial Inf STEMI.  On Plavix alone along with amlodipine       CAD S/P percutaneous coronary angioplasty; therapeutic P2Y12 for Plavix (Chronic)    No active angina. On Plavix & amlodipine as opposed to beta blocker. Beta blocker not used because of the concern for fatigue. Converted to ARB from ACE inhibitor. Cough improved. Back on statin as it did not seem to make  any difference to his fatigue and leg pains during statin holiday.      Dyslipidemia, goal LDL below 70 (Chronic)    Be reassessed now. He was close to goal I last saw him. Plan for now will be to recheck  in February if he has not had it done.      Essential hypertension (Chronic)    Blood pressures still controlled, at the top of normal range. On amlodipine and losartan. We have converted him to Diovan (but this was reversed because of the Diovan recall) - since he is not having any further cough, we could increase losartan if pressures continue to be elevated.         Current medicines are reviewed at length with the patient today. (+/- concerns) n/a The following changes have been made:   Patient Instructions  No changes with current medication    Your physician wants you to follow-up in 12 months with DR HARDING.You will receive a reminder letter in the mail two months in advance. If you don't receive a letter, please call our office to schedule the follow-up appointment.   If you need a refill on your cardiac medications before your next appointment, please call your pharmacy.    Studies Ordered:   No orders of the defined types were placed in this encounter.     [name], M.D., M.S. Interventional Cardiologist   Pager # [phone] Phone # [phone] [address] [place], [place] [address]

## 2017-01-10 NOTE — Assessment & Plan Note (Signed)
No active angina. On Plavix & amlodipine as opposed to beta blocker. Beta blocker not used because of the concern for fatigue. Converted to ARB from ACE inhibitor. Cough improved. Back on statin as it did not seem to make any difference to his fatigue and leg pains during statin holiday.

## 2017-01-10 NOTE — Assessment & Plan Note (Signed)
Be reassessed now. He was close to goal I last saw him. Plan for now will be to recheck in February if he has not had it done.

## 2017-01-10 NOTE — Patient Instructions (Signed)
No changes with current medication    Your physician wants you to follow-up in 12 months with DR HARDING.You will receive a reminder letter in the mail two months in advance. If you don't receive a letter, please call our office to schedule the follow-up appointment.   If you need a refill on your cardiac medications before your next appointment, please call your pharmacy.

## 2017-01-15 DIAGNOSIS — H47233 Glaucomatous optic atrophy, bilateral: Secondary | ICD-10-CM | POA: Diagnosis not present

## 2017-02-06 DIAGNOSIS — E119 Type 2 diabetes mellitus without complications: Secondary | ICD-10-CM | POA: Diagnosis not present

## 2017-02-07 DIAGNOSIS — J0100 Acute maxillary sinusitis, unspecified: Secondary | ICD-10-CM | POA: Diagnosis not present

## 2017-02-13 DIAGNOSIS — E119 Type 2 diabetes mellitus without complications: Secondary | ICD-10-CM | POA: Diagnosis not present

## 2017-02-20 DIAGNOSIS — E119 Type 2 diabetes mellitus without complications: Secondary | ICD-10-CM | POA: Diagnosis not present

## 2017-04-01 DIAGNOSIS — E119 Type 2 diabetes mellitus without complications: Secondary | ICD-10-CM | POA: Diagnosis not present

## 2017-04-01 DIAGNOSIS — I10 Essential (primary) hypertension: Secondary | ICD-10-CM | POA: Diagnosis not present

## 2017-04-01 DIAGNOSIS — K219 Gastro-esophageal reflux disease without esophagitis: Secondary | ICD-10-CM | POA: Diagnosis not present

## 2017-04-01 DIAGNOSIS — Z79899 Other long term (current) drug therapy: Secondary | ICD-10-CM | POA: Diagnosis not present

## 2017-04-01 DIAGNOSIS — M545 Low back pain: Secondary | ICD-10-CM | POA: Diagnosis not present

## 2017-04-09 DIAGNOSIS — N401 Enlarged prostate with lower urinary tract symptoms: Secondary | ICD-10-CM | POA: Diagnosis not present

## 2017-04-15 DIAGNOSIS — R351 Nocturia: Secondary | ICD-10-CM | POA: Diagnosis not present

## 2017-04-15 DIAGNOSIS — N5201 Erectile dysfunction due to arterial insufficiency: Secondary | ICD-10-CM | POA: Diagnosis not present

## 2017-04-15 DIAGNOSIS — N401 Enlarged prostate with lower urinary tract symptoms: Secondary | ICD-10-CM | POA: Diagnosis not present

## 2017-05-29 ENCOUNTER — Other Ambulatory Visit: Payer: Self-pay | Admitting: Cardiology

## 2017-05-29 DIAGNOSIS — I10 Essential (primary) hypertension: Secondary | ICD-10-CM

## 2017-05-31 ENCOUNTER — Other Ambulatory Visit: Payer: Self-pay | Admitting: Cardiology

## 2017-05-31 DIAGNOSIS — I10 Essential (primary) hypertension: Secondary | ICD-10-CM

## 2017-06-03 NOTE — Telephone Encounter (Signed)
Rx(s) sent to pharmacy electronically.  

## 2017-06-04 DIAGNOSIS — L821 Other seborrheic keratosis: Secondary | ICD-10-CM | POA: Diagnosis not present

## 2017-06-04 DIAGNOSIS — L111 Transient acantholytic dermatosis [Grover]: Secondary | ICD-10-CM | POA: Diagnosis not present

## 2017-06-04 DIAGNOSIS — L218 Other seborrheic dermatitis: Secondary | ICD-10-CM | POA: Diagnosis not present

## 2017-06-04 DIAGNOSIS — L578 Other skin changes due to chronic exposure to nonionizing radiation: Secondary | ICD-10-CM | POA: Diagnosis not present

## 2017-06-04 DIAGNOSIS — L570 Actinic keratosis: Secondary | ICD-10-CM | POA: Diagnosis not present

## 2017-07-03 DIAGNOSIS — L570 Actinic keratosis: Secondary | ICD-10-CM | POA: Diagnosis not present

## 2017-07-17 DIAGNOSIS — E119 Type 2 diabetes mellitus without complications: Secondary | ICD-10-CM | POA: Diagnosis not present

## 2017-07-17 DIAGNOSIS — R109 Unspecified abdominal pain: Secondary | ICD-10-CM | POA: Diagnosis not present

## 2017-08-02 DIAGNOSIS — L570 Actinic keratosis: Secondary | ICD-10-CM | POA: Diagnosis not present

## 2017-08-02 DIAGNOSIS — L578 Other skin changes due to chronic exposure to nonionizing radiation: Secondary | ICD-10-CM | POA: Diagnosis not present

## 2017-08-14 DIAGNOSIS — G4733 Obstructive sleep apnea (adult) (pediatric): Secondary | ICD-10-CM | POA: Diagnosis not present

## 2017-09-17 DIAGNOSIS — Z1211 Encounter for screening for malignant neoplasm of colon: Secondary | ICD-10-CM | POA: Diagnosis not present

## 2017-09-17 DIAGNOSIS — K219 Gastro-esophageal reflux disease without esophagitis: Secondary | ICD-10-CM | POA: Diagnosis not present

## 2017-09-18 ENCOUNTER — Telehealth: Payer: Self-pay

## 2017-09-18 NOTE — Telephone Encounter (Signed)
[name].  DH

## 2017-09-18 NOTE — Telephone Encounter (Signed)
   [facility] Pre-operative Risk Assessment    Request for surgical clearance:  1. What type of surgery is being performed? colonoscopy/endoscopy   2. When is this surgery scheduled? 10/15/17   3. What type of clearance is required (medical clearance vs. Pharmacy clearance to hold med vs. Both)? pharmacy clearance  4. Are there any medications that need to be held prior to surgery and how long? plavix   5. Practice name and name of physician performing surgery? [facility]; Dr Michail Sermon   6. What is your office phone number? [phone]    7.   What is your office fax number? [phone]  8.   Anesthesia type (None, local, MAC, general)? n/a   Felis Quillin, [place] 09/18/2017, 2:39 PM  _________________________________________________________________   (provider comments below)

## 2017-09-18 NOTE — Telephone Encounter (Signed)
Left VM for pt to call regarding his current status.

## 2017-09-18 NOTE — Telephone Encounter (Signed)
Dr. Herbie Baltimore, this pt has hx of inferior MI with PCI to the RCA in 2013.  July 2015 LAD DES. Also had D1 and circumflex disease evaluated with FFR --> nonsignificant. Medical therapy.  He is planned for colonoscopy/endoscopy. Can his Plavix be held and for how long?  Please route response back to CV DIV PREOP  Thank you

## 2017-09-19 NOTE — Telephone Encounter (Signed)
   Primary Cardiologist: Brent Lemma, MD  Chart reviewed as part of pre-operative protocol coverage. Patient was contacted 09/19/2017 in reference to pre-operative risk assessment for pending surgery as outlined below.  [name] was last seen on 01/10/2017 by Dr. Herbie Lang.  Since that day, [name] has done well with no new cardiac complaints.  Therefore, based on ACC/AHA guidelines, the patient would be at acceptable risk for the planned procedure without further cardiovascular testing.   Per Dr. Herbie Lang, [name].  I will route this recommendation to the requesting party via Epic fax function and remove from pre-op pool.  Please call with questions.  Brent Bon, NP 09/19/2017, 4:04 PM

## 2017-09-25 ENCOUNTER — Other Ambulatory Visit: Payer: Self-pay | Admitting: Cardiology

## 2017-10-15 DIAGNOSIS — K640 First degree hemorrhoids: Secondary | ICD-10-CM | POA: Diagnosis not present

## 2017-10-15 DIAGNOSIS — K635 Polyp of colon: Secondary | ICD-10-CM | POA: Diagnosis not present

## 2017-10-15 DIAGNOSIS — K219 Gastro-esophageal reflux disease without esophagitis: Secondary | ICD-10-CM | POA: Diagnosis not present

## 2017-10-15 DIAGNOSIS — D126 Benign neoplasm of colon, unspecified: Secondary | ICD-10-CM | POA: Diagnosis not present

## 2017-10-15 DIAGNOSIS — R12 Heartburn: Secondary | ICD-10-CM | POA: Diagnosis not present

## 2017-10-15 DIAGNOSIS — K2940 Chronic atrophic gastritis without bleeding: Secondary | ICD-10-CM | POA: Diagnosis not present

## 2017-10-15 DIAGNOSIS — Z1211 Encounter for screening for malignant neoplasm of colon: Secondary | ICD-10-CM | POA: Diagnosis not present

## 2017-10-15 DIAGNOSIS — K317 Polyp of stomach and duodenum: Secondary | ICD-10-CM | POA: Diagnosis not present

## 2017-10-15 DIAGNOSIS — K5730 Diverticulosis of large intestine without perforation or abscess without bleeding: Secondary | ICD-10-CM | POA: Diagnosis not present

## 2017-10-22 DIAGNOSIS — I2510 Atherosclerotic heart disease of native coronary artery without angina pectoris: Secondary | ICD-10-CM | POA: Diagnosis not present

## 2017-10-22 DIAGNOSIS — K219 Gastro-esophageal reflux disease without esophagitis: Secondary | ICD-10-CM | POA: Diagnosis not present

## 2017-10-22 DIAGNOSIS — G4733 Obstructive sleep apnea (adult) (pediatric): Secondary | ICD-10-CM | POA: Diagnosis not present

## 2017-10-22 DIAGNOSIS — E119 Type 2 diabetes mellitus without complications: Secondary | ICD-10-CM | POA: Diagnosis not present

## 2017-10-22 DIAGNOSIS — D126 Benign neoplasm of colon, unspecified: Secondary | ICD-10-CM | POA: Diagnosis not present

## 2017-10-22 DIAGNOSIS — M2550 Pain in unspecified joint: Secondary | ICD-10-CM | POA: Diagnosis not present

## 2017-10-22 DIAGNOSIS — Z1211 Encounter for screening for malignant neoplasm of colon: Secondary | ICD-10-CM | POA: Diagnosis not present

## 2017-10-22 DIAGNOSIS — K635 Polyp of colon: Secondary | ICD-10-CM | POA: Diagnosis not present

## 2017-10-22 DIAGNOSIS — Z79899 Other long term (current) drug therapy: Secondary | ICD-10-CM | POA: Diagnosis not present

## 2017-10-22 DIAGNOSIS — I10 Essential (primary) hypertension: Secondary | ICD-10-CM | POA: Diagnosis not present

## 2017-10-25 DIAGNOSIS — H5315 Visual distortions of shape and size: Secondary | ICD-10-CM | POA: Diagnosis not present

## 2017-10-25 DIAGNOSIS — H47233 Glaucomatous optic atrophy, bilateral: Secondary | ICD-10-CM | POA: Diagnosis not present

## 2017-10-25 DIAGNOSIS — H43823 Vitreomacular adhesion, bilateral: Secondary | ICD-10-CM | POA: Diagnosis not present

## 2017-11-04 DIAGNOSIS — R809 Proteinuria, unspecified: Secondary | ICD-10-CM | POA: Diagnosis not present

## 2017-11-13 ENCOUNTER — Encounter: Payer: Self-pay | Admitting: *Deleted

## 2017-11-13 DIAGNOSIS — Z006 Encounter for examination for normal comparison and control in clinical research program: Secondary | ICD-10-CM

## 2017-11-13 NOTE — Progress Notes (Signed)
BIOFLOW V Research study 4 year telephone follow up completed. Patient denies any adverse events. He has had no changes in his medication. I thanked him for his participation. Last remaining research follow up is due next year.

## 2017-11-19 DIAGNOSIS — H5315 Visual distortions of shape and size: Secondary | ICD-10-CM | POA: Diagnosis not present

## 2017-11-19 DIAGNOSIS — H47233 Glaucomatous optic atrophy, bilateral: Secondary | ICD-10-CM | POA: Diagnosis not present

## 2017-11-19 DIAGNOSIS — H43823 Vitreomacular adhesion, bilateral: Secondary | ICD-10-CM | POA: Diagnosis not present

## 2017-11-21 DIAGNOSIS — Z6827 Body mass index (BMI) 27.0-27.9, adult: Secondary | ICD-10-CM | POA: Diagnosis not present

## 2017-11-21 DIAGNOSIS — R634 Abnormal weight loss: Secondary | ICD-10-CM | POA: Diagnosis not present

## 2017-11-21 DIAGNOSIS — E663 Overweight: Secondary | ICD-10-CM | POA: Diagnosis not present

## 2017-11-21 DIAGNOSIS — F5101 Primary insomnia: Secondary | ICD-10-CM | POA: Diagnosis not present

## 2017-11-21 DIAGNOSIS — R5383 Other fatigue: Secondary | ICD-10-CM | POA: Diagnosis not present

## 2018-01-07 ENCOUNTER — Telehealth: Payer: Self-pay | Admitting: Cardiology

## 2018-01-07 DIAGNOSIS — R768 Other specified abnormal immunological findings in serum: Secondary | ICD-10-CM | POA: Diagnosis not present

## 2018-01-07 DIAGNOSIS — M791 Myalgia, unspecified site: Secondary | ICD-10-CM | POA: Diagnosis not present

## 2018-01-07 DIAGNOSIS — R5383 Other fatigue: Secondary | ICD-10-CM | POA: Diagnosis not present

## 2018-01-07 DIAGNOSIS — M2550 Pain in unspecified joint: Secondary | ICD-10-CM | POA: Diagnosis not present

## 2018-01-07 NOTE — Telephone Encounter (Signed)
Received records from [facility] On 01/07/18, Appt 01/10/18 @ 4:00PM. NV

## 2018-01-10 ENCOUNTER — Ambulatory Visit: Payer: Federal, State, Local not specified - PPO | Admitting: Cardiology

## 2018-01-10 ENCOUNTER — Encounter: Payer: Self-pay | Admitting: Cardiology

## 2018-01-10 VITALS — BP 138/83 | HR 62 | Ht 70.0 in | Wt 202.4 lb

## 2018-01-10 DIAGNOSIS — R5383 Other fatigue: Secondary | ICD-10-CM | POA: Diagnosis not present

## 2018-01-10 DIAGNOSIS — E785 Hyperlipidemia, unspecified: Secondary | ICD-10-CM | POA: Diagnosis not present

## 2018-01-10 DIAGNOSIS — Z9861 Coronary angioplasty status: Secondary | ICD-10-CM | POA: Diagnosis not present

## 2018-01-10 DIAGNOSIS — I2510 Atherosclerotic heart disease of native coronary artery without angina pectoris: Secondary | ICD-10-CM | POA: Diagnosis not present

## 2018-01-10 DIAGNOSIS — I10 Essential (primary) hypertension: Secondary | ICD-10-CM

## 2018-01-10 MED ORDER — AMLODIPINE BESYLATE 5 MG PO TABS: 5.0000 mg | ORAL_TABLET | Freq: Every day | ORAL | 2 refills | Status: DC

## 2018-01-10 NOTE — Progress Notes (Signed)
PCP: Philemon Kingdom, MD  Clinic Note: Chief Complaint  Patient presents with  . Follow-up    Annual exam  . Coronary Artery Disease    No angina  . Fatigue    HPI: [name] is a 62 y.o. male with a history of CAD status post PCI who presents Lang for annual follow-up.    CAD history:   Inferior MI with PCI to the RCA in 2013.    Crescendo angina in July 2015 -> abnormal Myoview --> cath showed severe mid LAD disease treated with DES stent. Also had D1 and circumflex disease evaluated with FFR --> nonsignificant. Medical therapy.  [name] was last seen in October 2018.  He is worried about borderline diabetes with his A1c being up a little bit.  He adjusted his diet.  Still noted his leg and foot numbness/neuropathy.  Otherwise negative cardiac symptoms.  Low but the dyspnea exertion--  but is out of shape.  Never really got into any exercise besides golfing post retirement.  Recent Hospitalizations: Nov 2017 -- ER visit with diverticulitis  Studies Personally Reviewed:   None  Interval History: [name] actually doing okay, he still complains of being a little bit more tired than usual.  Is not able to get up and go do things.  He does find the first 9 holes of golf, but the last 9 holes he just feels tired and is gained gets much worse.  He has not had any dyspnea or any chest pain.  Just a little bit of fatigue.  Over the last few months he has been noticing it more than usual.  He does not have any of his crescendo angina symptoms or heart failure symptoms of PND, orthopnea or edema.  He does try to exercise some and will note getting winded if he puts it too much but overall is just managing this issue. He was given a diagnosis of diabetes with A1c of roughly 7 last saw him last time it is down to 6.3 now he is not on any medicines.  He is done a good job with changing his diet and increasing his exercise goals.  Cardiovascular ROS: positive for  - dyspnea on exertion and Fatigue with prolonged mild exercise negative for - chest pain, edema, irregular heartbeat, loss of consciousness, murmur, orthopnea, palpitations, paroxysmal nocturnal dyspnea, rapid heart rate, shortness of breath or TIA/amaurosis fugax symptoms, syncope/near syncope, claudication.  Family history updated to indicate that his father just passed away with complications with pneumonia but was apparently diagnosed with lupus at the time.  ROS: A comprehensive was performed. Review of Systems  Constitutional: Positive for malaise/fatigue (A little better last time, still having problems with fatigue.).  HENT: Negative for congestion and nosebleeds.   Respiratory: Negative for cough (Notably improved).   Gastrointestinal: Negative for abdominal pain, blood in stool, heartburn (better) and melena.  Genitourinary: Negative for hematuria.  Musculoskeletal: Positive for joint pain (Mild arthritis pains) and myalgias (His legs ache).  Neurological: Positive for tingling (numbness in feet &legs - usually after activity).  Psychiatric/Behavioral: The patient has insomnia (Still not sleeping well).   All other systems reviewed and are negative.   Past Medical History:  Diagnosis Date  . Anxiety   . Arthritis    "mostly in my hands; sometimes other areas" (10/20/2013)  . Bipolar disorder (HCC)   . BPH (benign prostatic hyperplasia)   . CAD S/P percutaneous coronary angioplasty 06/2011, 09/2013   (Therapeutic  P2Y12 on Plavix);; a) STEMI -mid RCA DES PCI; 2015: Class III angina (abnormal MYOVIEW w/ Ant-AntLat ischemia -> mid-distal LAD 95-99% (PCI: Biotronik AG 2 DES 2.5 x 13, 2.75 mm), D1 proximal 50-60% (FFR 0.86) with 60-70% in small branch (med management), mid circumflex 50-60% (FFR 1.0)   . Depression   . Dizziness of unknown cause January 2011   abn MRI, Neuro w/u Jan 2011  . Dyslipidemia, goal LDL below 70   . Essential hypertension   . GERD (gastroesophageal reflux  disease)   . RA (rheumatoid arthritis) (HCC)   . Rheumatic fever 1969  . ST elevation myocardial infarction (STEMI) involving right coronary artery in recovery phase (HCC) 06/2011   100% occluded RCA; PCI - 3.0x24 Promus DES (3.5-3.6 mm) ; Modwith moderate LAD & Cx disease, EF 55-60% w/ basal inferoseptal HK confirmed by echo    Past Surgical History:  Procedure Laterality Date  . LEFT HEART CATHETERIZATION WITH CORONARY ANGIOGRAM N/A 06/28/2011   Procedure: LEFT HEART CATHETERIZATION WITH CORONARY ANGIOGRAM;  Surgeon: Marykay Lex, MD;  Location: [facility] CATH LAB;  50% LAD after D1, D1 30%.  Small D2.  Large caliber LCx with (small OM1) major OM 2 and 3 followed by ABG circumflex with 40% focal lesion terminating with LPL 1.  100% mid RCA (DES PCI); beyond 100% RCA  - large PDA (double barrel) and small PAV-PL -EF 55 to 60% w/ midInf-LAf HK.  Marland Kitchen LEFT HEART CATHETERIZATION WITH CORONARY ANGIOGRAM N/A 10/20/2013   Procedure: LEFT HEART CATHETERIZATION WITH CORONARY ANGIOGRAM;  Surgeon: Marykay Lex, MD;  Location: [facility] CATH LAB;  Service: Cardiovascular;  Laterality: N/A;  . NM MYOVIEW LTD N/A 10/19/2013   INTERMEDIATE RISK, septal, anterior-anterolateral ischemia --> referred for cath the following day -- LAD LESION TREATED WITH DES PCI  . PERCUTANEOUS CORONARY STENT INTERVENTION (PCI-S) N/A 06/2011; 09/2013   a) 3/'13: 100%  RCA ->PCI 3.20x2 (3.6) Promus DES stent; 7/'15: mLAD PCI Biotronik AG DES 2.5 x13 2.75 mm), FFR D1 50-60% = 0.86 (small branch had ~70%), mCx ~50-60% FFR=1.0.  . PLANTAR FASCIA SURGERY Left 2004   "& neuroma"  . SHOULDER ARTHROSCOPY W/ ROTATOR CUFF REPAIR Left July 2011  . TRANSTHORACIC ECHOCARDIOGRAM N/A 05/2016   EF ~55%. Normal LV Size & function. NO RWMA (inferior HK no longer present). Mlid LVH - Gr 1 DD.. Mild LA dilation.    Current Meds  Medication Sig  . amLODipine (NORVASC) 5 MG tablet Take 1 tablet (5 mg total) by mouth at bedtime.  Marland Kitchen atorvastatin (LIPITOR) 40 MG  tablet TAKE 1 TABLET BY MOUTH DAILY  . clopidogrel (PLAVIX) 75 MG tablet TAKE 1 TABLET BY MOUTH DAILY  . Coenzyme Q10 (CO Q-10) 300 MG CAPS Take 300 mg by mouth daily.  Marland Kitchen esomeprazole (NEXIUM) 20 MG capsule Take 20 mg by mouth daily at 12 noon.  . finasteride (PROSCAR) 5 MG tablet Take 5 mg by mouth daily.  . fluticasone (FLONASE) 50 MCG/ACT nasal spray Place 1 spray into the nose daily as needed. For allergies  . gabapentin (NEURONTIN) 600 MG tablet Take 1 tablet by mouth 3 (three) times daily.  Marland Kitchen losartan (COZAAR) 25 MG tablet TAKE 1 TABLET BY MOUTH DAILY  . nitroGLYCERIN (NITROSTAT) 0.4 MG SL tablet Place 1 tablet (0.4 mg total) under the tongue every 5 (five) minutes as needed for chest pain.  . [DISCONTINUED] amLODipine (NORVASC) 5 MG tablet TAKE 1 TABLET BY MOUTH ONCE DAILY    No Known Allergies  Social History   Tobacco Use  . Smoking status: Never Smoker  . Smokeless tobacco: Never Used  Substance Use Topics  . Alcohol use: No  . Drug use: No   Social History   Social History Narrative   Married father of 2. Never smoked. Does not drink alcohol significantly.   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   He does play golf on a fairly regular basis, but is currently not walking between holes, but riding. He is starting to play with a new partner who likes to walk, so he is hoping to get more walking.     family history includes Cancer (age of onset: 61) in his paternal grandmother; Cancer - Colon (age of onset: 15) in his mother; Heart attack (age of onset: 5) in his father; Heart failure (age of onset: 5) in his paternal grandfather; Heart failure (age of onset: 30) in his maternal grandmother; Lupus in his father; Pneumonia in his father.  Wt Readings from Last 3 Encounters:  01/10/18 202 lb 6.4 oz (91.8 kg)  01/10/17 205 lb 3.2 oz (93.1 kg)  07/12/16 206 lb 9.6 oz (93.7  kg)    PHYSICAL EXAM BP 138/83   Pulse 62   Wt 202 lb 6.4 oz (91.8 kg)   BMI 29.04 kg/m   Physical Exam  Constitutional: He is oriented to person, place, and time. He appears well-developed and well-nourished. No distress.  HENT:  Head: Normocephalic and atraumatic.  Eyes: Pupils are equal, round, and reactive to light. Conjunctivae and EOM are normal.  Neck: Normal range of motion. Neck supple. No hepatojugular reflux and no JVD present. Carotid bruit is not present.  Cardiovascular: Normal rate, normal heart sounds and intact distal pulses.  No extrasystoles are present. PMI is not displaced. Exam reveals no gallop and no friction rub.  No murmur heard. Pulmonary/Chest: Effort normal and breath sounds normal. No respiratory distress. He has no wheezes. He has no rales.  Abdominal: Soft. Bowel sounds are normal. He exhibits no distension. There is no tenderness. There is no rebound.  Musculoskeletal: Normal range of motion. He exhibits no edema.  Neurological: He is alert and oriented to person, place, and time.  Psychiatric: He has a normal mood and affect. His behavior is normal. Judgment and thought content normal.  Nursing note and vitals reviewed.   Adult ECG Report NSR rate 62 bpm.  1 degree AVB.  Stable/relative normal EKG.    Other studies Reviewed: Additional studies/ records that were reviewed Lang include:  Recent Labs:  Recent Labs: From PCP recently checked in July 2019:  Na+ 142, K+ 4.7, Cl- 101, HCO3-26, BUN 21, Cr 0.8, Glu 149, Ca2+ 9.5; AST 37, ALT 38, AlkP 91; A1c 6.3.,   CBC: W 6.0, H/H 14.3/37.5, Plt 150  No lipids Lab Results  Component Value Date   CHOL 133 05/09/2016   HDL 30 (L) 05/09/2016   LDLCALC 72 05/09/2016   TRIG 157 (H) 05/09/2016   CHOLHDL 4.4 05/09/2016   Lab Results  Component Value Date   CREATININE 0.86 05/09/2016   BUN 22 05/09/2016   NA 142 05/09/2016   K 4.4 05/09/2016   CL 104 05/09/2016   CO2 27 05/09/2016     ASSESSMENT / PLAN: Problem List Items Addressed This Visit    CAD S/P PCI to RCA (2013) and LAD (2015); therapeutic P2Y12  for Plavix - Primary (Chronic)    No further angina since his PCI in 2015.  He is having some fatigue but it does not sound anginal in nature. No longer on aspirin but he is still taking Plavix for maintenance. On amlodipine for blood pressure and antianginal effect. Was on an ACE inhibitor that was converted to ARB because of cough.  Cough improved.  Is now on low-dose losartan. Not on beta-blocker because of fatigue issues in the past and now currently having fatigue along with bradycardia.      Relevant Medications   amLODipine (NORVASC) 5 MG tablet   Other Relevant Orders   EKG 12-Lead   Dyslipidemia, goal LDL below 70 (Chronic)    I have not seen any labs since 2018.  May have had been checked by PCP, but it has been at least since last year at this time.  He remains on atorvastatin 40 mm daily. I would like to check a set of labs to see where he stands, if stable with LDL where it was last year in February, I would be comfortable having him take a statin holiday to see if this helps his fatigue.  Otherwise would want him stay on statin and would probably convert him to Crestor.      Relevant Medications   amLODipine (NORVASC) 5 MG tablet   Other Relevant Orders   Lipid panel   Essential hypertension (Chronic)    Blood pressure is actually a little bit higher Lang than it has been still on top of normal range.  I thought about potentially increasing his losartan dose, but he is noticing a little bit of dizziness and this fatigue issue.  But I actually have him do is switch his amlodipine to bedtime, and take the losartan in the morning but wait until after his golf game or exercise in the morning to take it.  Hopefully this will allow him to have a little bit of extra energy to last the entire golf game.      Relevant Medications   amLODipine (NORVASC) 5 MG  tablet   Other Relevant Orders   EKG 12-Lead   Fatigue due to treatment    Had a normal echocardiogram in the past for similar symptoms.  No ischemia type symptoms.  No longer on beta-blocker. I think he does want to think about psych evaluation although he seems to be much less depressed now. The only thing I can think of is that he may be having a little bit of hypotension during his exercise and we will therefore have him split his blood pressure medicines to take the amlodipine at night and losartan in the morning.  He will then hold losartan if he actually is going to be doing some of activity in the morning and he would just take that when he is done exercising.        With slight changes, will follow-up in 3 to 4 months just to see how he is doing.  Current medicines are reviewed at length with the patient Lang. (+/- concerns) is okay to use Aleve for arthritis pains The following changes have been made:Yes  Patient Instructions  Medication Instructions:    START TAKING AMLODIPINE 5 MG AT BEDTIME  OKAY TO USE ALEVE.  If you need a refill on your cardiac medications before your next appointment, please call your pharmacy.   Lab work: LIPID- DO NOT EAT OR DRINK THE MORNING If you have labs (blood work) drawn Lang  and your tests are completely normal, you will receive your results only by: Marland Kitchen MyChart Message (if you have MyChart) OR . A paper copy in the mail If you have any lab test that is abnormal or we need to change your treatment, we will call you to review the results.  Testing/Procedures: NOT NEEEDED  Follow-Up: At [facility], you and your health needs are our priority.  As part of our continuing mission to provide you with exceptional heart care, we have created designated Provider Care Teams.  These Care Teams include your primary Cardiologist (physician) and Advanced Practice Providers (APPs -  Physician Assistants and Nurse Practitioners) who all work  together to provide you with the care you need, when you need it. You will need a follow up appointment in 4 months.  Please call our office 2 months in advance to schedule this appointment.  You may see [name], MD or one of the following Advanced Practice Providers on your designated Care Team:   Theodore Demark, PA-C . Joni Reining, DNP, ANP  Any Other Special Instructions Will Be Listed Below (If Applicable).  IF YOU GO OUT AND EXERCISE OR GOLF , TAKE LOSARATAN ONCE YOU COME BACK  KEEP HYDRATED    Studies Ordered:   Orders Placed This Encounter  Procedures  . Lipid panel  . EKG 12-Lead      [name], M.D., M.S. Interventional Cardiologist   Pager # [phone] Phone # [phone] [address] [place], [place] [address]

## 2018-01-10 NOTE — Patient Instructions (Signed)
Medication Instructions:    START TAKING AMLODIPINE 5 MG AT BEDTIME  OKAY TO USE ALEVE.  If you need a refill on your cardiac medications before your next appointment, please call your pharmacy.   Lab work: LIPID- DO NOT EAT OR DRINK THE MORNING If you have labs (blood work) drawn today and your tests are completely normal, you will receive your results only by: Marland Kitchen MyChart Message (if you have MyChart) OR . A paper copy in the mail If you have any lab test that is abnormal or we need to change your treatment, we will call you to review the results.  Testing/Procedures: NOT NEEEDED  Follow-Up: At [facility], you and your health needs are our priority.  As part of our continuing mission to provide you with exceptional heart care, we have created designated Provider Care Teams.  These Care Teams include your primary Cardiologist (physician) and Advanced Practice Providers (APPs -  Physician Assistants and Nurse Practitioners) who all work together to provide you with the care you need, when you need it. You will need a follow up appointment in 4 months.  Please call our office 2 months in advance to schedule this appointment.  You may see [name], MD or one of the following Advanced Practice Providers on your designated Care Team:   Theodore Demark, PA-C . Joni Reining, DNP, ANP  Any Other Special Instructions Will Be Listed Below (If Applicable).  IF YOU GO OUT AND EXERCISE OR GOLF , TAKE LOSARATAN ONCE YOU COME BACK  KEEP HYDRATED

## 2018-01-12 ENCOUNTER — Encounter: Payer: Self-pay | Admitting: Cardiology

## 2018-01-12 NOTE — Assessment & Plan Note (Signed)
No further angina since his PCI in 2015.  He is having some fatigue but it does not sound anginal in nature. No longer on aspirin but he is still taking Plavix for maintenance. On amlodipine for blood pressure and antianginal effect. Was on an ACE inhibitor that was converted to ARB because of cough.  Cough improved.  Is now on low-dose losartan. Not on beta-blocker because of fatigue issues in the past and now currently having fatigue along with bradycardia.

## 2018-01-12 NOTE — Assessment & Plan Note (Signed)
Had a normal echocardiogram in the past for similar symptoms.  No ischemia type symptoms.  No longer on beta-blocker. I think he does want to think about psych evaluation although he seems to be much less depressed now. The only thing I can think of is that he may be having a little bit of hypotension during his exercise and we will therefore have him split his blood pressure medicines to take the amlodipine at night and losartan in the morning.  He will then hold losartan if he actually is going to be doing some of activity in the morning and he would just take that when he is done exercising.

## 2018-01-12 NOTE — Assessment & Plan Note (Signed)
I have not seen any labs since 2018.  May have had been checked by PCP, but it has been at least since last year at this time.  He remains on atorvastatin 40 mm daily. I would like to check a set of labs to see where he stands, if stable with LDL where it was last year in February, I would be comfortable having him take a statin holiday to see if this helps his fatigue.  Otherwise would want him stay on statin and would probably convert him to Crestor.

## 2018-01-12 NOTE — Assessment & Plan Note (Signed)
Blood pressure is actually a little bit higher today than it has been still on top of normal range.  I thought about potentially increasing his losartan dose, but he is noticing a little bit of dizziness and this fatigue issue.  But I actually have him do is switch his amlodipine to bedtime, and take the losartan in the morning but wait until after his golf game or exercise in the morning to take it.  Hopefully this will allow him to have a little bit of extra energy to last the entire golf game.

## 2018-01-15 DIAGNOSIS — M2550 Pain in unspecified joint: Secondary | ICD-10-CM | POA: Diagnosis not present

## 2018-01-20 DIAGNOSIS — G4733 Obstructive sleep apnea (adult) (pediatric): Secondary | ICD-10-CM | POA: Diagnosis not present

## 2018-02-03 DIAGNOSIS — L111 Transient acantholytic dermatosis [Grover]: Secondary | ICD-10-CM | POA: Diagnosis not present

## 2018-02-03 DIAGNOSIS — L738 Other specified follicular disorders: Secondary | ICD-10-CM | POA: Diagnosis not present

## 2018-02-03 DIAGNOSIS — L814 Other melanin hyperpigmentation: Secondary | ICD-10-CM | POA: Diagnosis not present

## 2018-02-03 DIAGNOSIS — L819 Disorder of pigmentation, unspecified: Secondary | ICD-10-CM | POA: Diagnosis not present

## 2018-02-03 DIAGNOSIS — L570 Actinic keratosis: Secondary | ICD-10-CM | POA: Diagnosis not present

## 2018-02-13 DIAGNOSIS — M2550 Pain in unspecified joint: Secondary | ICD-10-CM | POA: Diagnosis not present

## 2018-02-13 DIAGNOSIS — M064 Inflammatory polyarthropathy: Secondary | ICD-10-CM | POA: Diagnosis not present

## 2018-02-13 DIAGNOSIS — M150 Primary generalized (osteo)arthritis: Secondary | ICD-10-CM | POA: Diagnosis not present

## 2018-02-26 ENCOUNTER — Other Ambulatory Visit: Payer: Self-pay | Admitting: Cardiology

## 2018-02-26 DIAGNOSIS — I10 Essential (primary) hypertension: Secondary | ICD-10-CM

## 2018-04-09 DIAGNOSIS — R351 Nocturia: Secondary | ICD-10-CM | POA: Diagnosis not present

## 2018-04-09 DIAGNOSIS — N401 Enlarged prostate with lower urinary tract symptoms: Secondary | ICD-10-CM | POA: Diagnosis not present

## 2018-04-15 DIAGNOSIS — M2550 Pain in unspecified joint: Secondary | ICD-10-CM | POA: Diagnosis not present

## 2018-04-15 DIAGNOSIS — E785 Hyperlipidemia, unspecified: Secondary | ICD-10-CM | POA: Diagnosis not present

## 2018-04-15 DIAGNOSIS — M064 Inflammatory polyarthropathy: Secondary | ICD-10-CM | POA: Diagnosis not present

## 2018-04-15 DIAGNOSIS — M150 Primary generalized (osteo)arthritis: Secondary | ICD-10-CM | POA: Diagnosis not present

## 2018-04-15 DIAGNOSIS — M791 Myalgia, unspecified site: Secondary | ICD-10-CM | POA: Diagnosis not present

## 2018-04-15 LAB — LIPID PANEL
CHOL/HDL RATIO: 3.9 ratio (ref 0.0–5.0)
Cholesterol, Total: 155 mg/dL (ref 100–199)
HDL: 40 mg/dL (ref >39)
LDL CALC: 86 mg/dL (ref 0–99)
TRIGLYCERIDES: 146 mg/dL (ref 0–149)
VLDL Cholesterol Cal: 29 mg/dL (ref 5–40)

## 2018-04-16 DIAGNOSIS — N401 Enlarged prostate with lower urinary tract symptoms: Secondary | ICD-10-CM | POA: Diagnosis not present

## 2018-04-16 DIAGNOSIS — K4090 Unilateral inguinal hernia, without obstruction or gangrene, not specified as recurrent: Secondary | ICD-10-CM | POA: Diagnosis not present

## 2018-04-16 DIAGNOSIS — R351 Nocturia: Secondary | ICD-10-CM | POA: Diagnosis not present

## 2018-04-16 DIAGNOSIS — N5201 Erectile dysfunction due to arterial insufficiency: Secondary | ICD-10-CM | POA: Diagnosis not present

## 2018-04-17 ENCOUNTER — Telehealth: Payer: Self-pay | Admitting: *Deleted

## 2018-04-17 NOTE — Telephone Encounter (Signed)
-----   Message from Marykay Lex, MD sent at 04/16/2018  2:18 PM EST ----- So it looks like the LDL had gone down a year ago August 2.  He is now back up to 86.  I would like to see where we are sure what statin he is taking.  We can probably just discussed what to do next and follow-up.  [name], MD

## 2018-04-17 NOTE — Telephone Encounter (Signed)
The patient has been notified of the result and verbalized understanding.  All questions (if any) were answered. Tobin Chad, RN 04/17/2018 2:35 PM  PATIENT STATES HE IS HAVING SOME ACHES WITH JOINTS ,ANDPATIENT STATES DR HARDING MAY CHANGE MEDICATION IF STILL OCCURRING.  RN INFORMED PATIENT TO TAKE A STATIN HOLIDAY UNTIL  PATIENT'S OFFICE APPOINTMENT ON 2/ 12/20 WITH DR HARDING. PATIENT CAN INFORM DR HARDING IF SYMPTOMS IMPROVED PATIENT VOICED UNDERSTANDING

## 2018-04-25 DIAGNOSIS — Z79899 Other long term (current) drug therapy: Secondary | ICD-10-CM | POA: Diagnosis not present

## 2018-04-25 DIAGNOSIS — I10 Essential (primary) hypertension: Secondary | ICD-10-CM | POA: Diagnosis not present

## 2018-04-25 DIAGNOSIS — F5101 Primary insomnia: Secondary | ICD-10-CM | POA: Diagnosis not present

## 2018-04-25 DIAGNOSIS — E1169 Type 2 diabetes mellitus with other specified complication: Secondary | ICD-10-CM | POA: Diagnosis not present

## 2018-04-25 DIAGNOSIS — I2510 Atherosclerotic heart disease of native coronary artery without angina pectoris: Secondary | ICD-10-CM | POA: Diagnosis not present

## 2018-05-13 ENCOUNTER — Other Ambulatory Visit: Payer: Self-pay | Admitting: Cardiology

## 2018-05-14 ENCOUNTER — Encounter: Payer: Self-pay | Admitting: Cardiology

## 2018-05-14 ENCOUNTER — Ambulatory Visit: Payer: Federal, State, Local not specified - PPO | Admitting: Cardiology

## 2018-05-14 ENCOUNTER — Encounter (INDEPENDENT_AMBULATORY_CARE_PROVIDER_SITE_OTHER): Payer: Self-pay

## 2018-05-14 VITALS — BP 161/95 | HR 75 | Ht 70.0 in | Wt 204.2 lb

## 2018-05-14 DIAGNOSIS — I2510 Atherosclerotic heart disease of native coronary artery without angina pectoris: Secondary | ICD-10-CM | POA: Diagnosis not present

## 2018-05-14 DIAGNOSIS — I10 Essential (primary) hypertension: Secondary | ICD-10-CM

## 2018-05-14 DIAGNOSIS — I209 Angina pectoris, unspecified: Secondary | ICD-10-CM

## 2018-05-14 DIAGNOSIS — E785 Hyperlipidemia, unspecified: Secondary | ICD-10-CM

## 2018-05-14 DIAGNOSIS — I2119 ST elevation (STEMI) myocardial infarction involving other coronary artery of inferior wall: Secondary | ICD-10-CM

## 2018-05-14 DIAGNOSIS — Z9861 Coronary angioplasty status: Secondary | ICD-10-CM

## 2018-05-14 DIAGNOSIS — R7401 Elevation of levels of liver transaminase levels: Secondary | ICD-10-CM

## 2018-05-14 DIAGNOSIS — R740 Nonspecific elevation of levels of transaminase and lactic acid dehydrogenase [LDH]: Secondary | ICD-10-CM

## 2018-05-14 DIAGNOSIS — R0602 Shortness of breath: Secondary | ICD-10-CM

## 2018-05-14 MED ORDER — ROSUVASTATIN CALCIUM 20 MG PO TABS: 20.0000 mg | ORAL_TABLET | Freq: Every day | ORAL | 1 refills | Status: DC

## 2018-05-14 MED ORDER — AMLODIPINE BESYLATE 10 MG PO TABS: 10.0000 mg | ORAL_TABLET | Freq: Every day | ORAL | 3 refills | Status: DC

## 2018-05-14 NOTE — Telephone Encounter (Signed)
Rx(s) sent to pharmacy electronically.  

## 2018-05-14 NOTE — Patient Instructions (Signed)
Medication Instructions:  Increase AMLODIPINE 10 MG -   FOR ONE  MONTH - TAKE  20 MG ROSUVASTATIN DAILY   If you need a refill on your cardiac medications before your next appointment, please call your pharmacy.   Lab work: Not needed If you have labs (blood work) drawn today and your tests are completely normal, you will receive your results only by: Marland Kitchen MyChart Message (if you have MyChart) OR . A paper copy in the mail If you have any lab test that is abnormal or we need to change your treatment, we will call you to review the results.  Testing/Procedures: SCHEDULE AT [address] Your physician has requested that you have en exercise stress myoview. For further information please visit [email or web address] Please follow instruction sheet, as given.    Follow-Up: At [facility], you and your health needs are our priority.  As part of our continuing mission to provide you with exceptional heart care, we have created designated Provider Care Teams.  These Care Teams include your primary Cardiologist (physician) and Advanced Practice Providers (APPs -  Physician Assistants and Nurse Practitioners) who all work together to provide you with the care you need, when you need it. . Your physician recommends that you schedule a follow-up appointment in 1 TO 2 MONTH WITH DR HARDING. .   Any Other Special Instructions Will Be Listed Below (If Applicable).

## 2018-05-14 NOTE — Progress Notes (Signed)
PCP: Philemon Kingdom, MD  Clinic Note: Chief Complaint  Patient presents with  . Shortness of Breath    On exertion  . Coronary Artery Disease    Exertional dyspnea, but no angina.    HPI: [name] is a 63 y.o. male with a history of CAD status post PCI who presents today for annual follow-up.    CAD History:   Inferior MI with PCI to the RCA in 2013.    Crescendo angina in July 2015 -> abnormal Myoview --> cath showed severe mid LAD disease treated with DES stent. Also had D1 and circumflex disease evaluated with FFR --> nonsignificant. Medical therapy.  [name] was last seen in October 2019. Noted feeling tired. Some DOE.  Started Amlodipine.  Some lightheadedness/dizziness -- told to hydrate & take ARB after exercise, and to take amlodipine at bedtime.  Recent Hospitalizations: Nov 2017 -- ER visit with diverticulitis  Studies Personally Reviewed:   None  Interval History: [name] returns today still noticing that he is having more exertional dyspnea, gets short of breath walking uphill or up a flight of steps.  Just feels more tired than usual.  He is not having any chest tightness or pressure, but he is definitely feeling the fatigue and dyspnea that was similar to his last pre-PCI symptoms.  He is not sure though if the symptom is because of his heart or because his been having a lot of congestion issues.   No resting dyspnea, PND or orthopnea.  No rapid irregular heartbeats palpitations.  No syncope or near syncope.  No TIA or amaurosis fugax.  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  He has some vertigo and dizziness, but this does not sound to be orthostatic in nature. He does note that his leg fatigue has improved since being off statin.   Cardiovascular ROS: positive for - dyspnea on exertion and Fatigue with prolonged mild exercise negative for - chest pain, edema, irregular heartbeat, loss of consciousness, murmur, orthopnea,  palpitations, paroxysmal nocturnal dyspnea, rapid heart rate, shortness of breath or TIA/amaurosis fugax symptoms, syncope/near syncope, claudication.   ROS: A comprehensive was performed. Review of Systems  Constitutional: Positive for malaise/fatigue (A little better last time, still having problems with fatigue.).  HENT: Negative for congestion and nosebleeds.   Respiratory: Negative for cough (Notably improved).   Gastrointestinal: Negative for abdominal pain, blood in stool, heartburn (better) and melena.  Genitourinary: Negative for hematuria.  Musculoskeletal: Positive for joint pain (Mild arthritis pains) and myalgias (His legs ache).  Neurological: Positive for tingling (numbness in feet &legs - usually after activity).  Psychiatric/Behavioral: The patient has insomnia (Still not sleeping well).   All other systems reviewed and are negative.   Past Medical History:  Diagnosis Date  . Anxiety   . Arthritis    "mostly in my hands; sometimes other areas" (10/20/2013)  . Bipolar disorder (HCC)   . BPH (benign prostatic hyperplasia)   . CAD S/P percutaneous coronary angioplasty 06/2011, 09/2013   (Therapeutic P2Y12 on Plavix);; a) STEMI -mid RCA DES PCI; 2015: Class III angina (abnormal MYOVIEW w/ Ant-AntLat ischemia -> mid-distal LAD 95-99% (PCI: Biotronik AG 2 DES 2.5 x 13, 2.75 mm), D1 proximal 50-60% (FFR 0.86) with 60-70% in small branch (med management), mid circumflex 50-60% (FFR 1.0)   . Depression   . Dizziness of unknown cause January 2011   abn MRI, Neuro w/u Jan 2011  . Dyslipidemia, goal LDL below 70   .  Essential hypertension   . GERD (gastroesophageal reflux disease)   . RA (rheumatoid arthritis) (HCC)   . Rheumatic fever 1969  . ST elevation myocardial infarction (STEMI) involving right coronary artery in recovery phase (HCC) 06/2011   100% occluded RCA; PCI - 3.0x24 Promus DES (3.5-3.6 mm) ; Modwith moderate LAD & Cx disease, EF 55-60% w/ basal inferoseptal HK  confirmed by echo    Past Surgical History:  Procedure Laterality Date  . LEFT HEART CATHETERIZATION WITH CORONARY ANGIOGRAM N/A 06/28/2011   Procedure: LEFT HEART CATHETERIZATION WITH CORONARY ANGIOGRAM;  Surgeon: Marykay Lexavid W Harding, MD;  [facility];  50% LAD after D1, D1 30%.  Small D2.  Large caliber LCx with (small OM1) major OM 2 and 3 followed by ABG circumflex with 40% focal lesion terminating with LPL 1.  100% mid RCA (DES PCI); beyond 100% RCA  - large PDA (double barrel) and small PAV-PL -EF 55 to 60% w/ midInf-LAf HK.  Marland Kitchen. LEFT HEART CATHETERIZATION WITH CORONARY ANGIOGRAM N/A 10/20/2013   Procedure: LEFT HEART CATHETERIZATION WITH CORONARY ANGIOGRAM;  Surgeon: Marykay Lexavid W Harding, MD;  [facility];  Service: Cardiovascular;  Laterality: N/A;  . NM MYOVIEW LTD N/A 10/19/2013   INTERMEDIATE RISK, septal, anterior-anterolateral ischemia --> referred for cath the following day -- LAD LESION TREATED WITH DES PCI  . PERCUTANEOUS CORONARY STENT INTERVENTION (PCI-S) N/A 06/2011; 09/2013   a) 3/'13: 100%  RCA ->PCI 3.20x2 (3.6) Promus DES stent; 7/'15: mLAD PCI Biotronik AG DES 2.5 x13 2.75 mm), FFR D1 50-60% = 0.86 (small branch had ~70%), mCx ~50-60% FFR=1.0.  . PLANTAR FASCIA SURGERY Left 2004   "& neuroma"  . SHOULDER ARTHROSCOPY W/ ROTATOR CUFF REPAIR Left July 2011  . TRANSTHORACIC ECHOCARDIOGRAM N/A 05/2016   EF ~55%. Normal LV Size & function. NO RWMA (inferior HK no longer present). Mlid LVH - Gr 1 DD.. Mild LA dilation.    Current Meds  Medication Sig  . atorvastatin (LIPITOR) 40 MG tablet TAKE 1 TABLET BY MOUTH DAILY  . clopidogrel (PLAVIX) 75 MG tablet Take 1 tablet (75 mg total) by mouth daily.  . Coenzyme Q10 (CO Q-10) 300 MG CAPS Take 300 mg by mouth daily.  Marland Kitchen. esomeprazole (NEXIUM) 20 MG capsule Take 20 mg by mouth daily at 12 noon.  . finasteride (PROSCAR) 5 MG tablet Take 5 mg by mouth daily.  . fluticasone (FLONASE) 50 MCG/ACT nasal spray Place 1 spray into the  nose daily as needed. For allergies  . gabapentin (NEURONTIN) 600 MG tablet Take 1 tablet by mouth 3 (three) times daily.  Marland Kitchen. losartan (COZAAR) 25 MG tablet TAKE 1 TABLET BY MOUTH DAILY  . nitroGLYCERIN (NITROSTAT) 0.4 MG SL tablet Place 1 tablet (0.4 mg total) under the tongue every 5 (five) minutes as needed for chest pain.  . [DISCONTINUED] amLODipine (NORVASC) 5 MG tablet Take 1 tablet (5 mg total) by mouth at bedtime.    No Known Allergies  Social History   Tobacco Use  . Smoking status: Never Smoker  . Smokeless tobacco: Never Used  Substance Use Topics  . Alcohol use: No  . Drug use: No   Social History   Social History Narrative   Married father of 2. Never smoked. Does not drink alcohol significantly.   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   He does play golf on a  fairly regular basis, but is currently not walking between holes, but riding. He is starting to play with a new partner who likes to walk, so he is hoping to get more walking.     family history includes Cancer (age of onset: 32) in his paternal grandmother; Cancer - Colon (age of onset: 94) in his mother; Heart attack (age of onset: 66) in his father; Heart failure (age of onset: 12) in his paternal grandfather; Heart failure (age of onset: 67) in his maternal grandmother; Lupus in his father; Pneumonia in his father.  Wt Readings from Last 3 Encounters:  05/14/18 204 lb 3.2 oz (92.6 kg)  01/10/18 202 lb 6.4 oz (91.8 kg)  01/10/17 205 lb 3.2 oz (93.1 kg)    PHYSICAL EXAM BP (!) 161/95   Pulse 75   Ht  (1.778 m)   Wt 204 lb 3.2 oz (92.6 kg)   SpO2 97%   BMI 29.30 kg/m   Physical Exam  Constitutional: He is oriented to person, place, and time. He appears well-developed and well-nourished. No distress.  HENT:  Head: Normocephalic and atraumatic.  Eyes: Pupils are equal, round, and reactive to  light. Conjunctivae and EOM are normal.  Neck: Normal range of motion. Neck supple. No hepatojugular reflux and no JVD present. Carotid bruit is not present.  Cardiovascular: Normal rate, normal heart sounds and intact distal pulses.  No extrasystoles are present. PMI is not displaced. Exam reveals no gallop and no friction rub.  No murmur heard. Pulmonary/Chest: Effort normal and breath sounds normal. No respiratory distress. He has no wheezes. He has no rales.  Abdominal: Soft. Bowel sounds are normal. He exhibits no distension. There is no abdominal tenderness. There is no rebound.  Musculoskeletal: Normal range of motion.        General: No edema.  Neurological: He is alert and oriented to person, place, and time. No cranial nerve deficit.  Psychiatric: He has a normal mood and affect. His behavior is normal. Judgment and thought content normal.  Nursing note and vitals reviewed.   Adult ECG Report NSR rate 62 bpm.  1 degree AVB.  Stable/relative normal EKG.    Other studies Reviewed: Additional studies/ records that were reviewed today include:  Recent Labs:   Lab Results  Component Value Date   CHOL 155 04/15/2018   HDL 40 04/15/2018   LDLCALC 86 04/15/2018   TRIG 146 04/15/2018   CHOLHDL 3.9 04/15/2018   Lab Results  Component Value Date   CREATININE 0.86 05/09/2016   BUN 22 05/09/2016   NA 142 05/09/2016   K 4.4 05/09/2016   CL 104 05/09/2016   CO2 27 05/09/2016    ASSESSMENT / PLAN: Problem List Items Addressed This Visit    Angina, class II - decreased exercise tolerance, dyspnea & fatigue - Primary (Chronic)    Hard to tell if his symptoms are angina or not.  But it is definitely exertional dyspnea that was similar to his pre-PCI angina in the past.  Plan: Check Treadmill Myoview Stress Test.  Increase amlodipine 10 mg for additional antianginal effect.  Restart statin but change to Crestor 20 mg.       Relevant Medications   rosuvastatin (CRESTOR) 20 MG  tablet   amLODipine (NORVASC) 10 MG tablet   Other Relevant Orders   MYOCARDIAL PERFUSION IMAGING   CAD S/P PCI to RCA (2013) and LAD (2015); therapeutic P2Y12 for Plavix (Chronic)    Not really having true angina symptoms  like he did when he had his MI, but is having exertional dyspnea.  Remains on Plavix without aspirin.  Is on amlodipine, but not beta-blocker because of fatigue. On ARB. Restarting statin with Crestor 20 mg daily,hoping that this will be less likely to have side effects.      Relevant Medications   rosuvastatin (CRESTOR) 20 MG tablet   amLODipine (NORVASC) 10 MG tablet   Other Relevant Orders   MYOCARDIAL PERFUSION IMAGING   Dyslipidemia, goal LDL below 70 (Chronic)    LDL did go up to 86 after holding statin.  I think we need to have him back on something. Plan: DC'd atorvastatin and start Crestor 20 mg.  We will need lab follow-up in roughly 4 months.      Relevant Medications   rosuvastatin (CRESTOR) 20 MG tablet   amLODipine (NORVASC) 10 MG tablet   Elevated alanine aminotransferase (ALT) level   Essential hypertension (Chronic)    Blood pressure is high again today.  We have room to increase amlodipine to 10 mg for anti-tangible effect.  No longer on beta-blocker because of fatigue.  Next step would be to increase losartan.      Relevant Medications   rosuvastatin (CRESTOR) 20 MG tablet   amLODipine (NORVASC) 10 MG tablet   History of ST elevation myocardial infarction (STEMI) of inferior wall (Chronic)    After inferior MI, had inferoseptal hypokinesis noted on echocardiogram, however large infarct not noted on Myoview.  EF still preserved.  With recurrent exertional dyspnea symptoms, plan to evaluate with Myoview stress test.      Relevant Medications   rosuvastatin (CRESTOR) 20 MG tablet   amLODipine (NORVASC) 10 MG tablet   Shortness of breath    Cannot tell if this is related to his congestion or ischemia.  But since his symptoms have been  persistent, the plan was to check a Myoview stress test.  I would like to see how he does on the treadmill therefore we will do a treadmill Myoview.      Relevant Orders   MYOCARDIAL PERFUSION IMAGING      Current medicines are reviewed at length with the patient today. (+/- concerns) - n/a The following changes have been made:increase amlodipine to 10mg  ; trial of Crestor 20 mg;   Patient Instructions  Medication Instructions:  Increase AMLODIPINE 10 MG -   FOR ONE  MONTH - TAKE  20 MG ROSUVASTATIN DAILY   If you need a refill on your cardiac medications before your next appointment, please call your pharmacy.   Lab work: Not needed If you have labs (blood work) drawn today and your tests are completely normal, you will receive your results only by: Marland Kitchen. MyChart Message (if you have MyChart) OR . A paper copy in the mail If you have any lab test that is abnormal or we need to change your treatment, we will call you to review the results.  Testing/Procedures: SCHEDULE AT [address] Your physician has requested that you have en exercise stress myoview. For further information please visit [email or web address]. Please follow instruction sheet, as given.    Follow-Up: At [facility], you and your health needs are our priority.  As part of our continuing mission to provide you with exceptional heart care, we have created designated Provider Care Teams.  These Care Teams include your primary Cardiologist (physician) and Advanced Practice Providers (APPs -  Physician Assistants and Nurse Practitioners) who all work together to provide you with  the care you need, when you need it. . Your physician recommends that you schedule a follow-up appointment in 1 TO 2 MONTH WITH DR HARDING. .   Any Other Special Instructions Will Be Listed Below (If Applicable).    Studies Ordered:   Orders Placed This Encounter  Procedures  . MYOCARDIAL PERFUSION IMAGING       [name], M.D., M.S. Interventional Cardiologist   Pager # [phone] Phone # [phone] [address] [place], [place] [address]

## 2018-05-17 ENCOUNTER — Encounter: Payer: Self-pay | Admitting: Cardiology

## 2018-05-17 NOTE — Assessment & Plan Note (Signed)
LDL did go up to 86 after holding statin.  I think we need to have him back on something. Plan: DC'd atorvastatin and start Crestor 20 mg.  We will need lab follow-up in roughly 4 months.

## 2018-05-17 NOTE — Assessment & Plan Note (Signed)
Cannot tell if this is related to his congestion or ischemia.  But since his symptoms have been persistent, the plan was to check a Myoview stress test.  I would like to see how he does on the treadmill therefore we will do a treadmill Myoview.

## 2018-05-17 NOTE — Assessment & Plan Note (Signed)
Not really having true angina symptoms like he did when he had his MI, but is having exertional dyspnea.  Remains on Plavix without aspirin.  Is on amlodipine, but not beta-blocker because of fatigue. On ARB. Restarting statin with Crestor 20 mg daily,hoping that this will be less likely to have side effects.

## 2018-05-17 NOTE — Assessment & Plan Note (Signed)
After inferior MI, had inferoseptal hypokinesis noted on echocardiogram, however large infarct not noted on Myoview.  EF still preserved.  With recurrent exertional dyspnea symptoms, plan to evaluate with Myoview stress test.

## 2018-05-17 NOTE — Assessment & Plan Note (Signed)
Blood pressure is high again today.  We have room to increase amlodipine to 10 mg for anti-tangible effect.  No longer on beta-blocker because of fatigue.  Next step would be to increase losartan.

## 2018-05-17 NOTE — Assessment & Plan Note (Signed)
Hard to tell if his symptoms are angina or not.  But it is definitely exertional dyspnea that was similar to his pre-PCI angina in the past.  Plan: Check Treadmill Myoview Stress Test.  Increase amlodipine 10 mg for additional antianginal effect.  Restart statin but change to Crestor 20 mg.

## 2018-05-21 ENCOUNTER — Telehealth (HOSPITAL_COMMUNITY): Payer: Self-pay

## 2018-05-21 NOTE — Telephone Encounter (Signed)
Encounter complete. 

## 2018-05-22 DIAGNOSIS — H47233 Glaucomatous optic atrophy, bilateral: Secondary | ICD-10-CM | POA: Diagnosis not present

## 2018-05-22 DIAGNOSIS — H04123 Dry eye syndrome of bilateral lacrimal glands: Secondary | ICD-10-CM | POA: Diagnosis not present

## 2018-05-23 ENCOUNTER — Ambulatory Visit (HOSPITAL_COMMUNITY)
Admission: RE | Admit: 2018-05-23 | Discharge: 2018-05-23 | Disposition: A | Discharge status: Home / Self Care | Payer: Federal, State, Local not specified - PPO | Source: Ambulatory Visit | Attending: Cardiovascular Disease | Admitting: Cardiovascular Disease

## 2018-05-23 DIAGNOSIS — Z9861 Coronary angioplasty status: Secondary | ICD-10-CM | POA: Insufficient documentation

## 2018-05-23 DIAGNOSIS — R0602 Shortness of breath: Secondary | ICD-10-CM | POA: Insufficient documentation

## 2018-05-23 DIAGNOSIS — I2510 Atherosclerotic heart disease of native coronary artery without angina pectoris: Secondary | ICD-10-CM | POA: Diagnosis not present

## 2018-05-23 DIAGNOSIS — I209 Angina pectoris, unspecified: Secondary | ICD-10-CM | POA: Diagnosis not present

## 2018-05-23 HISTORY — PX: NM MYOVIEW LTD: HXRAD82

## 2018-05-23 LAB — MYOCARDIAL PERFUSION IMAGING
CSEPED: 9 min
Estimated workload: 11.2 METS
Exercise duration (sec): 40 s
LV dias vol: 135 mL (ref 62–150)
LV sys vol: 72 mL
MPHR: 157 {beats}/min
Peak HR: 146 {beats}/min
Percent HR: 92 %
RPE: 17
Rest HR: 63 {beats}/min
SDS: 1
SRS: 3
SSS: 4
TID: 1.35

## 2018-05-23 MED ORDER — TECHNETIUM TC 99M TETROFOSMIN IV KIT
29.9000 | PACK | Freq: Once | INTRAVENOUS | Status: AC | PRN
  Administered 2018-05-23: 29.9 via INTRAVENOUS
  Filled 2018-05-23: qty 30

## 2018-05-23 MED ORDER — TECHNETIUM TC 99M TETROFOSMIN IV KIT
10.5000 | PACK | Freq: Once | INTRAVENOUS | Status: AC | PRN
  Administered 2018-05-23: 10.5 via INTRAVENOUS
  Filled 2018-05-23: qty 11

## 2018-06-04 ENCOUNTER — Telehealth: Payer: Self-pay | Admitting: *Deleted

## 2018-06-04 NOTE — Telephone Encounter (Signed)
The patient has been notified of the result and verbalized understanding.  All questions (if any) were answered. Tobin Chad, RN 06/04/2018 11:48 AM

## 2018-06-04 NOTE — Telephone Encounter (Signed)
-----   Message from Marykay Lex, MD sent at 05/23/2018 10:33 PM EST ----- Not really sure what to make of this stress test result.  It is read as intermediate risk, but exercise tolerance was pretty good, blood pressure was quite high which indicates we probably need to do a better job treating blood pressure.  The interesting thing was that there was a abnormal finding in the rest not the stress images.  This is not usually an area of concern with exception of the fact that this would correlate with the original heart attack location.   I think we need to talk about how much his symptoms are really bothering him when he comes back in April.  If he is quite limited then maybe we should consider relook cardiac catheterization just to make sure that the 2 original stent locations are okay but also the other 2 areas that were monitoring are okay.  Certainly not a high risk finding with obvious ischemia.  Therefore I do not think we need to rush to have him come in but may be sooner than April would be best.  [name], MD  pls fwd to PCP: Brent Kingdom, MD

## 2018-06-04 NOTE — Telephone Encounter (Signed)
LEFT MESSAGE TO CALL BACK  IN REGARDS TO STRESS TEST RESULTS 

## 2018-07-03 ENCOUNTER — Telehealth: Payer: Self-pay | Admitting: *Deleted

## 2018-07-03 NOTE — Telephone Encounter (Signed)
Follow up:    Patient returning  Call back concerning his appt. Please call patient back.

## 2018-07-03 NOTE — Telephone Encounter (Signed)
LEFT MESSAGE TO CALL BACK - SCHEDULE E-VISIT [name] 07/07/18

## 2018-07-04 NOTE — Telephone Encounter (Signed)
   TELEPHONE CALL NOTE  This patient has been deemed a candidate for follow-up tele-health visit to limit community exposure during the Covid-19 pandemic. I spoke with the patient via phone to discuss instructions. The patient will receive a phone call 2-3 days prior to their E-Visit at which time consent will be verbally confirmed. A Virtual Office Visit appointment type has been scheduled for 07/07/18 4:30 PM with DR HARDING,  patient prefers TELEPHONE  type.  I have either confirmed the patient is active in MyChart or offered to send sign-up link  PATIENT DECLINE.  Tobin Chad, RN 07/04/2018 10:06 AM

## 2018-07-07 ENCOUNTER — Encounter: Payer: Self-pay | Admitting: Cardiology

## 2018-07-07 ENCOUNTER — Telehealth (INDEPENDENT_AMBULATORY_CARE_PROVIDER_SITE_OTHER): Payer: Federal, State, Local not specified - PPO | Admitting: Cardiology

## 2018-07-07 ENCOUNTER — Telehealth: Payer: Self-pay | Admitting: *Deleted

## 2018-07-07 ENCOUNTER — Other Ambulatory Visit: Payer: Self-pay

## 2018-07-07 ENCOUNTER — Encounter: Payer: Federal, State, Local not specified - PPO | Admitting: Cardiology

## 2018-07-07 VITALS — BP 119/78 | HR 77 | Ht 70.0 in | Wt 202.0 lb

## 2018-07-07 DIAGNOSIS — R0609 Other forms of dyspnea: Secondary | ICD-10-CM

## 2018-07-07 DIAGNOSIS — I209 Angina pectoris, unspecified: Secondary | ICD-10-CM

## 2018-07-07 DIAGNOSIS — I2510 Atherosclerotic heart disease of native coronary artery without angina pectoris: Secondary | ICD-10-CM

## 2018-07-07 DIAGNOSIS — E785 Hyperlipidemia, unspecified: Secondary | ICD-10-CM

## 2018-07-07 DIAGNOSIS — R0600 Dyspnea, unspecified: Secondary | ICD-10-CM

## 2018-07-07 DIAGNOSIS — R5383 Other fatigue: Secondary | ICD-10-CM

## 2018-07-07 DIAGNOSIS — Z9861 Coronary angioplasty status: Secondary | ICD-10-CM

## 2018-07-07 DIAGNOSIS — I10 Essential (primary) hypertension: Secondary | ICD-10-CM

## 2018-07-07 NOTE — Assessment & Plan Note (Signed)
This is related to 8 symptoms a headache he is having.  It seemed to be a little better with improving his nasal congestion, but is still present. A stress test was somewhat abnormal, albeit somewhat unusual.  I think this point I think we will plan on scheduling an elective relook catheterization once the COVID-19 restrictions are lifted.  If symptoms worsen, will bring him in for urgent procedure.  Depending on what we find, we may or may not come to a conclusion, but we need to exclude worsening ischemic CAD

## 2018-07-07 NOTE — Progress Notes (Signed)
Virtual Visit via Video Note   This visit type was conducted due to national recommendations for restrictions regarding the COVID-19 Pandemic (e.g. social distancing) in an effort to limit this patient's exposure and mitigate transmission in our community.  Due to his co-morbid illnesses, this patient is at least at moderate risk for complications without adequate follow up.  This format is felt to be most appropriate for this patient at this time.  All issues noted in this document were discussed and addressed.  A limited physical exam was performed with this format.  Please refer to the patient's chart for his consent to telehealth for [facility].   Evaluation Performed:  Follow-up visit  Date:  07/07/2018   ID:  [name], [name], [name], MRN [id number]  Patient Location: Home  Provider Location: Home  PCP:  Philemon Kingdom, MD  Cardiologist:  [name], MD Electrophysiologist:  None   Chief Complaint: 6-week follow-up for CAD with angina/exertional dyspnea.  History of Present Illness:    [name] is a 63 y.o. male who presents via audio/video conferencing for a telehealth visit today.    CAD History:   Inferior MI with PCI to the RCA in 2013.    Crescendo angina in July 2015 -> abnormal Myoview --> cath showed severe mid LAD disease treated with DES stent. Also had D1 and circumflex disease evaluated with FFR --> nonsignificant. Medical therapy.  [name] was recently seen on May 14, 2018 for follow-up of.  He was noticing more exertional dyspnea walking uphill or up a flight of steps.  No real chest pain or pressure, but was feeling more fatigue and dyspnea that was similar to his pre-PCI symptoms in the past.  Was not sure if it is related to congestion issues or heart issues.  No heart failure symptoms of PND, orthopnea or edema.  No irregular heartbeats or palpitations. --> Which was evaluated with a Myoview stress test reviewed below which was  read as INTERMEDIATE RISK.   INTERVAL HISTORY: Somewhat better since stopping Naprosyn - less congested.  Still has DOE with uphill, longer walks or increased exertion.   Less edema off of Naprosyn.  No PND or Orthopnea.  No exertional CP.   Dizzy after bending over for a while, ten standing up.   Cardiovascular ROS: positive for - dyspnea on exertion and Pretty much stable with significant exertion, or climbing up hill, up steps.  Has a hard time walking all the way to his mailbox.  This has usually been easy for him. negative for - chest pain, edema, irregular heartbeat, orthopnea, palpitations, paroxysmal nocturnal dyspnea, rapid heart rate or No resting dyspnea.  No syncope/near syncope or TIA/amorous fugax.  Just some mild positional dizziness.  No melena, hematochezia, dysuria epistaxis.  Review of Systems  Constitutional: Positive for malaise/fatigue (stable). Negative for chills, fever and weight loss.  HENT: Positive for congestion (better since stopping Naprosyn). Negative for nosebleeds.   Respiratory: Positive for shortness of breath (exertional only). Negative for cough and wheezing.   Cardiovascular: Positive for leg swelling (better since stopping NSAID).  Gastrointestinal: Negative for abdominal pain, blood in stool, constipation, diarrhea, heartburn, melena and nausea.  Genitourinary: Negative for dysuria.  Musculoskeletal: Negative for joint pain.  Neurological: Positive for dizziness (positional) and headaches. Negative for speech change, focal weakness, loss of consciousness and weakness.  Psychiatric/Behavioral: Positive for depression (stable). The patient is nervous/anxious (stable). The patient does not have insomnia.   All other systems reviewed and  are negative.  See above for details.  The patient does not have symptoms concerning for COVID-19 infection (fever, chills, cough, or new shortness of breath).    Past Medical History:  Diagnosis Date  . Anxiety    . Arthritis    "mostly in my hands; sometimes other areas" (10/20/2013)  . Bipolar disorder (HCC)   . BPH (benign prostatic hyperplasia)   . CAD S/P percutaneous coronary angioplasty 06/2011, 09/2013   (Therapeutic P2Y12 on Plavix);; a) STEMI -mid RCA DES PCI; 2015: Class III angina (abnormal MYOVIEW w/ Ant-AntLat ischemia -> mid-distal LAD 95-99% (PCI: Biotronik AG 2 DES 2.5 x 13, 2.75 mm), D1 proximal 50-60% (FFR 0.86) with 60-70% in small branch (med management), mid circumflex 50-60% (FFR 1.0)   . Depression   . Dizziness of unknown cause January 2011   abn MRI, Neuro w/u Jan 2011  . Dyslipidemia, goal LDL below 70   . Essential hypertension   . GERD (gastroesophageal reflux disease)   . RA (rheumatoid arthritis) (HCC)   . Rheumatic fever 1969  . ST elevation myocardial infarction (STEMI) involving right coronary artery in recovery phase (HCC) 06/2011   100% occluded RCA; PCI - 3.0x24 Promus DES (3.5-3.6 mm) ; Modwith moderate LAD & Cx disease, EF 55-60% w/ basal inferoseptal HK confirmed by echo   Past Surgical History:  Procedure Laterality Date  . LEFT HEART CATHETERIZATION WITH CORONARY ANGIOGRAM N/A 06/28/2011   Procedure: LEFT HEART CATHETERIZATION WITH CORONARY ANGIOGRAM;  Surgeon: Marykay Lex, MD;  Location: [facility] CATH LAB;  50% LAD after D1, D1 30%.  Small D2.  Large caliber LCx with (small OM1) major OM 2 and 3 followed by ABG circumflex with 40% focal lesion terminating with LPL 1.  100% mid RCA (DES PCI); beyond 100% RCA  - large PDA (double barrel) and small PAV-PL -EF 55 to 60% w/ midInf-LAf HK.  Marland Kitchen LEFT HEART CATHETERIZATION WITH CORONARY ANGIOGRAM N/A 10/20/2013   Procedure: LEFT HEART CATHETERIZATION WITH CORONARY ANGIOGRAM;  Surgeon: Marykay Lex, MD;  Location[facility] CATH LAB;  Service: Cardiovascular;  Laterality: N/A;  . NM MYOVIEW LTD N/A 10/19/2013   INTERMEDIATE RISK, septal, anterior-anterolateral ischemia --> referred for cath the following day -- LAD LESION TREATED  WITH DES PCI  . PERCUTANEOUS CORONARY STENT INTERVENTION (PCI-S) N/A 06/2011; 09/2013   a) 3/'13: 100%  RCA ->PCI 3.20x2 (3.6) Promus DES stent; 7/'15: mLAD PCI Biotronik AG DES 2.5 x13 2.75 mm), FFR D1 50-60% = 0.86 (small branch had ~70%), mCx ~50-60% FFR=1.0.  . PLANTAR FASCIA SURGERY Left 2004   "& neuroma"  . SHOULDER ARTHROSCOPY W/ ROTATOR CUFF REPAIR Left July 2011  . TRANSTHORACIC ECHOCARDIOGRAM N/A Lang/2018   EF ~55%. Normal LV Size & function. NO RWMA (inferior HK no longer present). Mlid LVH - Gr 1 DD.. Mild LA dilation.      Current Meds  Medication Sig  . amLODipine (NORVASC) 10 MG tablet Take 1 tablet (10 mg total) by mouth daily.  Marland Kitchen atorvastatin (LIPITOR) 40 MG tablet TAKE 1 TABLET BY MOUTH DAILY  . clopidogrel (PLAVIX) 75 MG tablet Take 1 tablet (75 mg total) by mouth daily.  . Coenzyme Q10 (CO Q-10) 300 MG CAPS Take 300 mg by mouth daily.  . finasteride (PROSCAR) 5 MG tablet Take 5 mg by mouth daily.  . fluticasone (FLONASE) 50 MCG/ACT nasal spray Place 1 spray into the nose daily as needed. For allergies  . gabapentin (NEURONTIN) 600 MG tablet Take 1 tablet by mouth  as directed.   Marland Kitchen losartan (COZAAR) 25 MG tablet TAKE 1 TABLET BY MOUTH DAILY  . nitroGLYCERIN (NITROSTAT) 0.4 MG SL tablet Place 1 tablet (0.4 mg total) under the tongue every 5 (five) minutes as needed for chest pain.  . rosuvastatin (CRESTOR) 20 MG tablet Take 1 tablet (20 mg total) by mouth daily. ATORVASTATIN ON HOLD AT PRESENT TIME     Allergies:   Patient has no known allergies.   Social History   Tobacco Use  . Smoking status: Never Smoker  . Smokeless tobacco: Never Used  Substance Use Topics  . Alcohol use: No  . Drug use: No     Family Hx: The patient's family history includes Cancer (age of onset: 43) in his paternal grandmother; Cancer - Colon (age of onset: 75) in his mother; Heart attack (age of onset: 7) in his father; Heart failure (age of onset: 41) in his paternal grandfather; Heart  failure (age of onset: 28) in his maternal grandmother; Lupus in his father; Pneumonia in his father.  Prior CV studies:   The following studies were reviewed today:   MYOVIEW 05/23/2018: INTERMEDIATE RISK.  Hypertensive response to exercise.  No EKG change.  Medium sized moderate severity defect in the basal inferior and mid inferior wall -> defect actually improves upon stress imaging.  Consider hibernating myocardium. Decreased wall motion and basal to apical inferior inferoseptal wall.  Otherwise normal EF 45-50%.  Labs/Other Tests and Data Reviewed:    EKG:  No ECG reviewed.  Recent Labs: No results found for requested labs within last 8760 hours.   Recent Lipid Panel Lab Results  Component Value Date/Time   CHOL 155 04/15/2018 08:50 AM   CHOL 205 (H) 09/13/2014 08:21 AM   TRIG 146 04/15/2018 08:50 AM   TRIG 164 (H) 09/13/2014 08:21 AM   HDL 40 04/15/2018 08:50 AM   HDL 50 09/13/2014 08:21 AM   CHOLHDL 3.9 04/15/2018 08:50 AM   CHOLHDL 4.4 05/09/2016 10:39 AM   LDLCALC 86 04/15/2018 08:50 AM   LDLCALC 122 (H) 09/13/2014 08:21 AM    Wt Readings from Last 3 Encounters:  07/07/18 202 lb (91.6 kg)  05/23/18 204 lb (92.5 kg)  05/14/18 204 lb 3.2 oz (92.6 kg)     Objective:    Vital Signs:  BP 119/78   Pulse 77   Ht 5\' 10"  (1.778 m)   Wt 202 lb (91.6 kg)   BMI 28.98 kg/m    Well nourished, well developed male in no acute distress.  Healthy appearing. Non-labored breathing.   No edema or JVD. No rash  ASSESSMENT & PLAN:    Problem List Items Addressed This Visit    Angina, class II - decreased exercise tolerance, dyspnea & fatigue (Chronic)    Almost sure of his symptoms are truly related angina or not.  Unfortunately a Myoview stress test was if anything more confusing.  Not sure what to make of a stress test that shows better images with exertion then at rest.  For now the plan will be to continue with higher dose of amlodipine and hope to follow along.  We will  recheck in about 8 weeks to determine how her symptoms are doing.  Hopefully at that time the COVID-19 restrictions will have been lifted and we can consider possibly scheduling diagnostic cardiac catheterization.  However I did instruct him that if he were to have exacerbation or worsening of symptoms, he needs to notify us ASAP in order to get  an urgent cath scheduled.  He is not on beta-blocker because of fatigue. He is on statin as well as Plavix.      CAD S/P PCI to RCA (2013) and LAD (2015); therapeutic P2Y12 for Plavix (Chronic)    Following ongoing symptoms.    Will plan diagnostic cath once COVID-19 restrictions are lifted.  Continue statin, ARB, amlodipine and Plavix. Off beta-blocker because of fatigue.      Dyslipidemia, goal LDL below 70 (Chronic)    Back on statin - no real change in myalgias on of off.  No change in Sx with rosuvastatin vs/ atorvastatin.  Follow-up labs can be checked with pre-Cath labs      Dyspnea on exertion - Primary    This is related to 8 symptoms a headache he is having.  It seemed to be a little better with improving his nasal congestion, but is still present. A stress test was somewhat abnormal, albeit somewhat unusual.  I think this point I think we will plan on scheduling an elective relook catheterization once the COVID-19 restrictions are lifted.  If symptoms worsen, will bring him in for urgent procedure.  Depending on what we find, we may or may not come to a conclusion, but we need to exclude worsening ischemic CAD      Essential hypertension (Chronic)   Fatigue due to treatment (Chronic)    Not on Beta Blocker for this. No real change in symptoms but want statin virtually other or with holding statin.  This is been something that is been ongoing for several years now.          COVID-19 Education: The signs and symptoms of COVID-19 were discussed with the patient and how to seek care for testing (follow up with PCP or arrange  E-visit).  The importance of social distancing was discussed today.  Time:   Today, I have spent 25  minutes with the patient with telehealth technology discussing the above problems.     Medication Adjustments/Labs and Tests Ordered: Current medicines are reviewed at length with the patient today.  Concerns regarding medicines are outlined above.  Tests Ordered: No orders of the defined types were placed in this encounter.  Medication Changes: No orders of the defined types were placed in this encounter.   Disposition:  Follow up in 8 week(s) (end of May - televisit)  Signed, Bryan Lemmaavid Malyssa Maris, MD  07/07/2018 6:30 PM    [facility]

## 2018-07-07 NOTE — Assessment & Plan Note (Signed)
Following ongoing symptoms.    Will plan diagnostic cath once COVID-19 restrictions are lifted.  Continue statin, ARB, amlodipine and Plavix. Off beta-blocker because of fatigue.

## 2018-07-07 NOTE — Patient Instructions (Addendum)
Medication Instructions:  No current medication changes. If you need a refill on your cardiac medications before your next appointment, please call your pharmacy.   Lab work: No new labs. If you have labs (blood work) drawn today and your tests are completely normal, you will receive your results only by: Marland Kitchen MyChart Message (if you have MyChart) OR . A paper copy in the mail If you have any lab test that is abnormal or we need to change your treatment, we will call you to review the results.  Testing/Procedures: Continue to monitor symptoms.  Stress test was abnormal as we mentioned, but not high risk.  For now would like to continue to monitor as long as symptoms are not progressing we will hold off.  Once the COVID-19 restrictions have been lifted, we can then talk about potentially scheduling for relook cardiac catheterization just to make sure that everything is okay. [name], MD  Follow-Up: At [facility], you and your health needs are our priority.  As part of our continuing mission to provide you with exceptional heart care, we have created designated Provider Care Teams.  These Care Teams include your primary Cardiologist (physician) and Advanced Practice Providers (APPs -  Physician Assistants and Nurse Practitioners) who all work together to provide you with the care you need, when you need it. . Your physician recommends that you schedule a follow-up appointment after discussion on your symptoms once You call back towards the end of May 2020. Marland Kitchen   Any Other Special Instructions Will Be Listed Below (If Applicable).  We will follow-up via telephone in the end of May to determine symptoms. We can set you up for a telephone call follow-up at that time.

## 2018-07-07 NOTE — Assessment & Plan Note (Signed)
Almost sure of his symptoms are truly related angina or not.  Unfortunately a Myoview stress test was if anything more confusing.  Not sure what to make of a stress test that shows better images with exertion then at rest.  For now the plan will be to continue with higher dose of amlodipine and hope to follow along.  We will recheck in about 8 weeks to determine how her symptoms are doing.  Hopefully at that time the COVID-19 restrictions will have been lifted and we can consider possibly scheduling diagnostic cardiac catheterization.  However I did instruct him that if he were to have exacerbation or worsening of symptoms, he needs to notify us ASAP in order to get an urgent cath scheduled.  He is not on beta-blocker because of fatigue. He is on statin as well as Plavix.

## 2018-07-07 NOTE — Assessment & Plan Note (Signed)
Not on Beta Blocker for this. No real change in symptoms but want statin virtually other or with holding statin.  This is been something that is been ongoing for several years now.

## 2018-07-07 NOTE — Progress Notes (Signed)
Virtual Visit via Video Note   This visit type was conducted due to national recommendations for restrictions regarding the COVID-19 Pandemic (e.g. social distancing) in an effort to limit this patient's exposure and mitigate transmission in our community.  Due to his co-morbid illnesses, this patient is at least at moderate risk for complications without adequate follow up.  This format is felt to be most appropriate for this patient at this time.  All issues noted in this document were discussed and addressed.  A limited physical exam was performed with this format.  Please refer to the patient's chart for his consent to telehealth for [facility].   Evaluation Performed:  Follow-up visit  Date:  07/07/2018   ID:  [name], [name] 08-Jun-1955, MRN [id number]  Patient Location: Home  Provider Location: Home  PCP:  Philemon Kingdom, MD  Cardiologist:  [name], MD Electrophysiologist:  None   Chief Complaint: 6-week follow-up for CAD with angina/exertional dyspnea.  History of Present Illness:    [name] is a 63 y.o. male who presents via audio/video conferencing for a telehealth visit today.    CAD History:   Inferior MI with PCI to the RCA in 2013.    Crescendo angina in July 2015 -> abnormal Myoview --> cath showed severe mid LAD disease treated with DES stent. Also had D1 and circumflex disease evaluated with FFR --> nonsignificant. Medical therapy.  Philemon was recently seen on May 14, 2018 for follow-up of.  He was noticing more exertional dyspnea walking uphill or up a flight of steps.  No real chest pain or pressure, but was feeling more fatigue and dyspnea that was similar to his pre-PCI symptoms in the past.  Was not sure if it is related to congestion issues or heart issues.  No heart failure symptoms of PND, orthopnea or edema.  No irregular heartbeats or palpitations. --> Which was evaluated with a Myoview stress test reviewed below which was  read as INTERMEDIATE RISK.   INTERVAL HISTORY: Somewhat better since stopping Naprosyn - less congested.  Still has DOE with uphill, longer walks or increased exertion.   Less edema off of Naprosyn.  No PND or Orthopnea.  No exertional CP.   Dizzy after bending over for a while, ten standing up.   Cardiovascular ROS: positive for - dyspnea on exertion and Pretty much stable with significant exertion, or climbing up hill, up steps.  Has a hard time walking all the way to his mailbox.  This has usually been easy for him. negative for - chest pain, edema, irregular heartbeat, orthopnea, palpitations, paroxysmal nocturnal dyspnea, rapid heart rate or No resting dyspnea.  No syncope/near syncope or TIA/amorous fugax.  Just some mild positional dizziness.  No melena, hematochezia, dysuria epistaxis.  Review of Systems  Constitutional: Positive for malaise/fatigue (stable). Negative for chills, fever and weight loss.  HENT: Positive for congestion (better since stopping Naprosyn). Negative for nosebleeds.   Respiratory: Positive for shortness of breath (exertional only). Negative for cough and wheezing.   Cardiovascular: Positive for leg swelling (better since stopping NSAID).  Gastrointestinal: Negative for abdominal pain, blood in stool, constipation, diarrhea, heartburn, melena and nausea.  Genitourinary: Negative for dysuria.  Musculoskeletal: Negative for joint pain.  Neurological: Positive for dizziness (positional) and headaches. Negative for speech change, focal weakness, loss of consciousness and weakness.  Psychiatric/Behavioral: Positive for depression (stable). The patient is nervous/anxious (stable). The patient does not have insomnia.   All other systems reviewed and  are negative.  See above for details.  The patient does not have symptoms concerning for COVID-19 infection (fever, chills, cough, or new shortness of breath).    Past Medical History:  Diagnosis Date  . Anxiety    . Arthritis    "mostly in my hands; sometimes other areas" (10/20/2013)  . Bipolar disorder (HCC)   . BPH (benign prostatic hyperplasia)   . CAD S/P percutaneous coronary angioplasty 06/2011, 09/2013   (Therapeutic P2Y12 on Plavix);; a) STEMI -mid RCA DES PCI; 2015: Class III angina (abnormal MYOVIEW w/ Ant-AntLat ischemia -> mid-distal LAD 95-99% (PCI: Biotronik AG 2 DES 2.5 x 13, 2.75 mm), D1 proximal 50-60% (FFR 0.86) with 60-70% in small branch (med management), mid circumflex 50-60% (FFR 1.0)   . Depression   . Dizziness of unknown cause January 2011   abn MRI, Neuro w/u Jan 2011  . Dyslipidemia, goal LDL below 70   . Essential hypertension   . GERD (gastroesophageal reflux disease)   . RA (rheumatoid arthritis) (HCC)   . Rheumatic fever 1969  . ST elevation myocardial infarction (STEMI) involving right coronary artery in recovery phase (HCC) 06/2011   100% occluded RCA; PCI - 3.0x24 Promus DES (3.5-3.6 mm) ; Modwith moderate LAD & Cx disease, EF 55-60% w/ basal inferoseptal HK confirmed by echo   Past Surgical History:  Procedure Laterality Date  . LEFT HEART CATHETERIZATION WITH CORONARY ANGIOGRAM N/A 06/28/2011   Procedure: LEFT HEART CATHETERIZATION WITH CORONARY ANGIOGRAM;  Surgeon: Marykay Lex, MD;  Location: [facility] CATH LAB;  50% LAD after D1, D1 30%.  Small D2.  Large caliber LCx with (small OM1) major OM 2 and 3 followed by ABG circumflex with 40% focal lesion terminating with LPL 1.  100% mid RCA (DES PCI); beyond 100% RCA  - large PDA (double barrel) and small PAV-PL -EF 55 to 60% w/ midInf-LAf HK.  Marland Kitchen LEFT HEART CATHETERIZATION WITH CORONARY ANGIOGRAM N/A 10/20/2013   Procedure: LEFT HEART CATHETERIZATION WITH CORONARY ANGIOGRAM;  Surgeon: Marykay Lex, MD;  Location[facility] C[facility] LAB;  Service: Cardiovascular;  Laterality: N/A;  . NM MYOVIEW LTD N/A 10/19/2013   INTERMEDIATE RISK, septal, anterior-anterolateral ischemia --> referred for cath the following day -- LAD LESION TREATED  WITH DES PCI  . PERCUTANEOUS CORONARY STENT INTERVENTION (PCI-S) N/A 06/2011; 09/2013   a) 3/'13: 100%  RCA ->PCI 3.20x2 (3.6) Promus DES stent; 7/'15: mLAD PCI Biotronik AG DES 2.5 x13 2.75 mm), FFR D1 50-60% = 0.86 (small branch had ~70%), mCx ~50-60% FFR=1.0.  . PLANTAR FASCIA SURGERY Left 2004   "& neuroma"  . SHOULDER ARTHROSCOPY W/ ROTATOR CUFF REPAIR Left July 2011  . TRANSTHORACIC ECHOCARDIOGRAM N/A 05/2016   EF ~55%. Normal LV Size & function. NO RWMA (inferior HK no longer present). Mlid LVH - Gr 1 DD.. Mild LA dilation.      Current Meds  Medication Sig  . amLODipine (NORVASC) 10 MG tablet Take 1 tablet (10 mg total) by mouth daily.  Marland Kitchen atorvastatin (LIPITOR) 40 MG tablet TAKE 1 TABLET BY MOUTH DAILY  . clopidogrel (PLAVIX) 75 MG tablet Take 1 tablet (75 mg total) by mouth daily.  . Coenzyme Q10 (CO Q-10) 300 MG CAPS Take 300 mg by mouth daily.  . finasteride (PROSCAR) 5 MG tablet Take 5 mg by mouth daily.  . fluticasone (FLONASE) 50 MCG/ACT nasal spray Place 1 spray into the nose daily as needed. For allergies  . gabapentin (NEURONTIN) 600 MG tablet Take 1 tablet by mouth  as directed.   Marland Kitchen losartan (COZAAR) 25 MG tablet TAKE 1 TABLET BY MOUTH DAILY  . nitroGLYCERIN (NITROSTAT) 0.4 MG SL tablet Place 1 tablet (0.4 mg total) under the tongue every 5 (five) minutes as needed for chest pain.  . rosuvastatin (CRESTOR) 20 MG tablet Take 1 tablet (20 mg total) by mouth daily. ATORVASTATIN ON HOLD AT PRESENT TIME     Allergies:   Patient has no known allergies.   Social History   Tobacco Use  . Smoking status: Never Smoker  . Smokeless tobacco: Never Used  Substance Use Topics  . Alcohol use: No  . Drug use: No     Family Hx: The patient's family history includes Cancer (age of onset: 18) in his paternal grandmother; Cancer - Colon (age of onset: 87) in his mother; Heart attack (age of onset: 39) in his father; Heart failure (age of onset: 30) in his paternal grandfather; Heart  failure (age of onset: 83) in his maternal grandmother; Lupus in his father; Pneumonia in his father.  Prior CV studies:   The following studies were reviewed today:   MYOVIEW 05/23/2018: INTERMEDIATE RISK.  Hypertensive response to exercise.  No EKG change.  Medium sized moderate severity defect in the basal inferior and mid inferior wall -> defect actually improves upon stress imaging.  Consider hibernating myocardium. Decreased wall motion and basal to apical inferior inferoseptal wall.  Otherwise normal EF 45-50%.  Labs/Other Tests and Data Reviewed:    EKG:  No ECG reviewed.  Recent Labs: No results found for requested labs within last 8760 hours.   Recent Lipid Panel Lab Results  Component Value Date/Time   CHOL 155 04/15/2018 08:50 AM   CHOL 205 (H) 09/13/2014 08:21 AM   TRIG 146 04/15/2018 08:50 AM   TRIG 164 (H) 09/13/2014 08:21 AM   HDL 40 04/15/2018 08:50 AM   HDL 50 09/13/2014 08:21 AM   CHOLHDL 3.9 04/15/2018 08:50 AM   CHOLHDL 4.4 05/09/2016 10:39 AM   LDLCALC 86 04/15/2018 08:50 AM   LDLCALC 122 (H) 09/13/2014 08:21 AM    Wt Readings from Last 3 Encounters:  07/07/18 202 lb (91.6 kg)  05/23/18 204 lb (92.5 kg)  05/14/18 204 lb 3.2 oz (92.6 kg)     Objective:    Vital Signs:  BP 119/78   Pulse 77   Ht 5\' 10"  (1.778 m)   Wt 202 lb (91.6 kg)   BMI 28.98 kg/m    Well nourished, well developed male in no acute distress.  Healthy appearing. Non-labored breathing.   No edema or JVD. No rash  ASSESSMENT & PLAN:    Problem List Items Addressed This Visit    Angina, class II - decreased exercise tolerance, dyspnea & fatigue (Chronic)    Almost sure of his symptoms are truly related angina or not.  Unfortunately a Myoview stress test was if anything more confusing.  Not sure what to make of a stress test that shows better images with exertion then at rest.  For now the plan will be to continue with higher dose of amlodipine and hope to follow along.  We will  recheck in about 8 weeks to determine how her symptoms are doing.  Hopefully at that time the COVID-19 restrictions will have been lifted and we can consider possibly scheduling diagnostic cardiac catheterization.  However I did instruct him that if he were to have exacerbation or worsening of symptoms, he needs to notify us ASAP in order to get  an urgent cath scheduled.  He is not on beta-blocker because of fatigue. He is on statin as well as Plavix.      CAD S/P PCI to RCA (2013) and LAD (2015); therapeutic P2Y12 for Plavix (Chronic)    Following ongoing symptoms.    Will plan diagnostic cath once COVID-19 restrictions are lifted.  Continue statin, ARB, amlodipine and Plavix. Off beta-blocker because of fatigue.      Dyslipidemia, goal LDL below 70 (Chronic)    Back on statin - no real change in myalgias on of off.  No change in Sx with rosuvastatin vs/ atorvastatin.  Follow-up labs can be checked with pre-Cath labs      Dyspnea on exertion - Primary    This is related to 8 symptoms a headache he is having.  It seemed to be a little better with improving his nasal congestion, but is still present. A stress test was somewhat abnormal, albeit somewhat unusual.  I think this point I think we will plan on scheduling an elective relook catheterization once the COVID-19 restrictions are lifted.  If symptoms worsen, will bring him in for urgent procedure.  Depending on what we find, we may or may not come to a conclusion, but we need to exclude worsening ischemic CAD      Essential hypertension (Chronic)   Fatigue due to treatment (Chronic)    Not on Beta Blocker for this. No real change in symptoms but want statin virtually other or with holding statin.  This is been something that is been ongoing for several years now.          COVID-19 Education: The signs and symptoms of COVID-19 were discussed with the patient and how to seek care for testing (follow up with PCP or arrange  E-visit).  The importance of social distancing was discussed today.  Time:   Today, I have spent 25  minutes with the patient with telehealth technology discussing the above problems.     Medication Adjustments/Labs and Tests Ordered: Current medicines are reviewed at length with the patient today.  Concerns regarding medicines are outlined above.  Tests Ordered: No orders of the defined types were placed in this encounter.  Medication Changes: No orders of the defined types were placed in this encounter.   Disposition:  Follow up in 8 week(s) (end of May - televisit)  Signed, [name], MD  07/07/2018 6:30 PM    [facility]  This encounter was created in error - please disregard. This encounter was created in error - please disregard.

## 2018-07-07 NOTE — Telephone Encounter (Signed)
Open error 

## 2018-07-07 NOTE — Assessment & Plan Note (Signed)
Back on statin - no real change in myalgias on of off.  No change in Sx with rosuvastatin vs/ atorvastatin.  Follow-up labs can be checked with pre-Cath labs

## 2018-07-11 ENCOUNTER — Other Ambulatory Visit: Payer: Self-pay | Admitting: Cardiology

## 2018-07-22 DIAGNOSIS — F5101 Primary insomnia: Secondary | ICD-10-CM | POA: Diagnosis not present

## 2018-07-22 DIAGNOSIS — E663 Overweight: Secondary | ICD-10-CM | POA: Diagnosis not present

## 2018-07-22 DIAGNOSIS — G629 Polyneuropathy, unspecified: Secondary | ICD-10-CM | POA: Diagnosis not present

## 2018-07-22 DIAGNOSIS — I10 Essential (primary) hypertension: Secondary | ICD-10-CM | POA: Diagnosis not present

## 2018-08-16 DIAGNOSIS — S81812A Laceration without foreign body, left lower leg, initial encounter: Secondary | ICD-10-CM | POA: Diagnosis not present

## 2018-09-04 IMAGING — CT CT ABD-PELV W/ CM
2 of 5 series · 15 of 46 positions shown, 17 images · IV contrast (APPLIED)
Comparison: None.

CLINICAL DATA: Acute onset of mid abdominal pain and diarrhea.
Decreased urinary output. Initial encounter.

EXAM:
CT ABDOMEN AND PELVIS WITH CONTRAST
TECHNIQUE: Multidetector CT imaging of the abdomen and pelvis was performed
using the standard protocol following bolus administration of
intravenous contrast.
CONTRAST:  100mL OYUHJY-6CC IOPAMIDOL (OYUHJY-6CC) INJECTION 61%

[Series 2: abd/ pelvis 5.0 i30f 1 · axial · 0.79mm/px · z∈[+1096,+1546]mm · 12 of 102 slices shown, 14 images]
[im 6/102  soft-tissue]
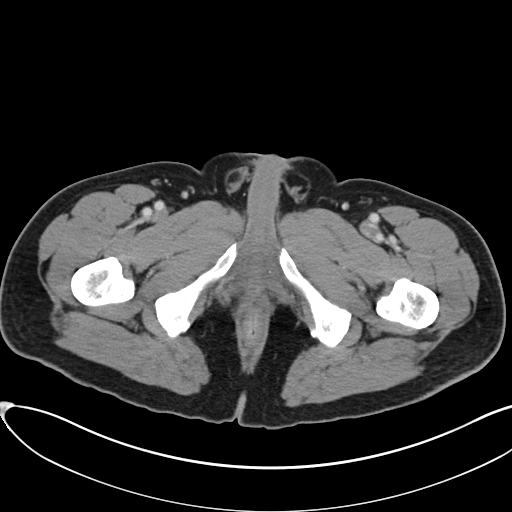
[im 6/102  bone]
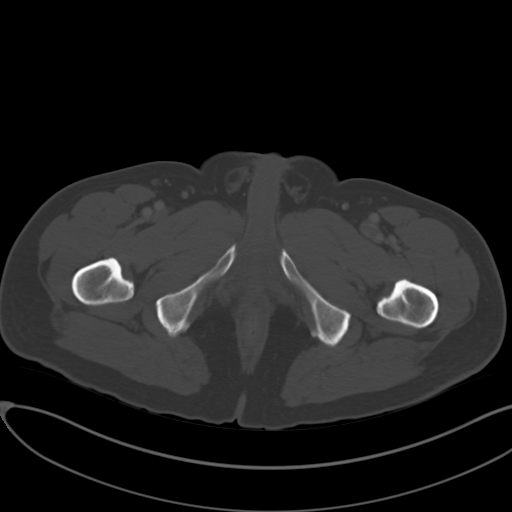
[im 16/102  soft-tissue]
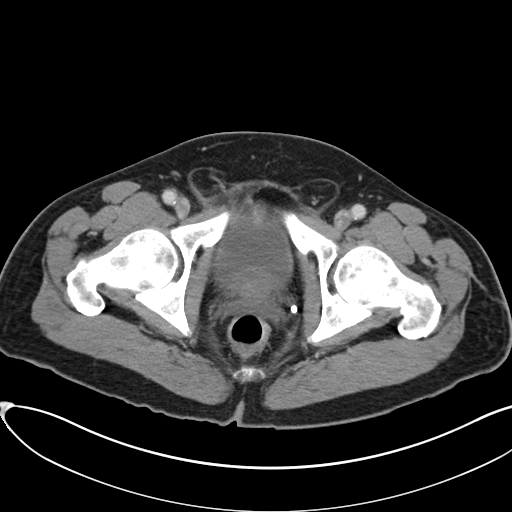
[im 21/102  soft-tissue]
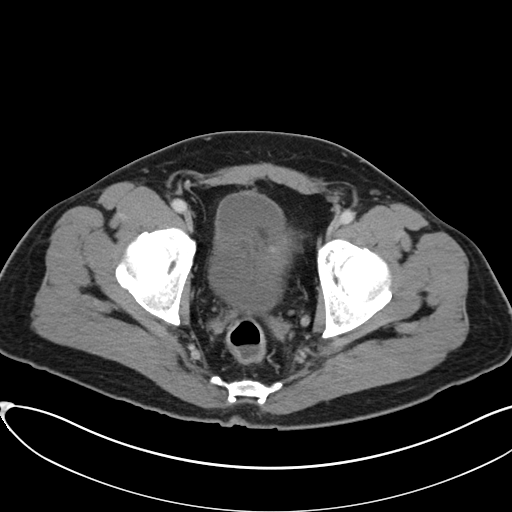
[im 31/102  soft-tissue]
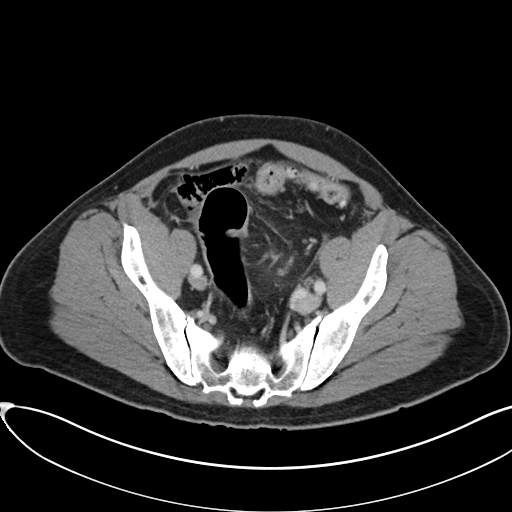
[im 41/102  soft-tissue]
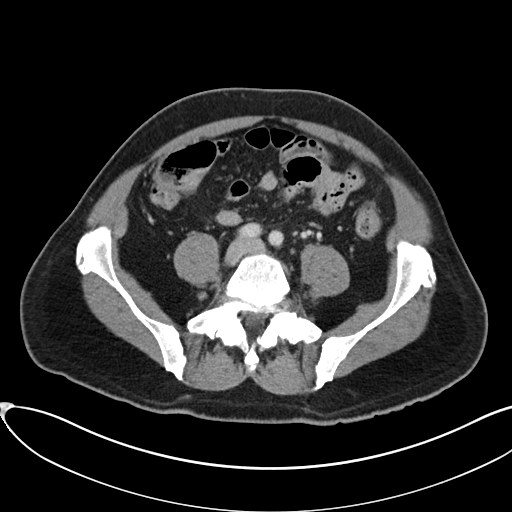
[im 46/102  soft-tissue]
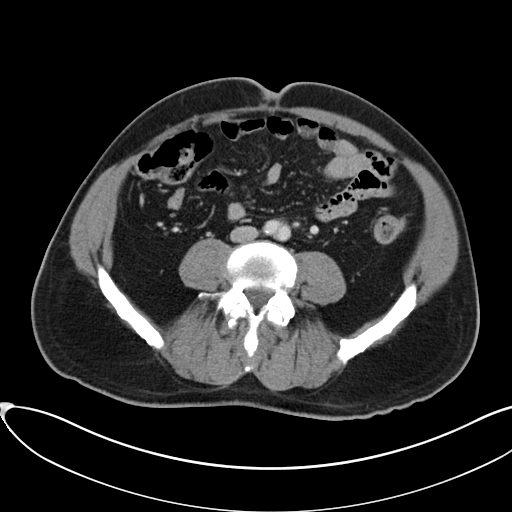
[im 56/102  soft-tissue]
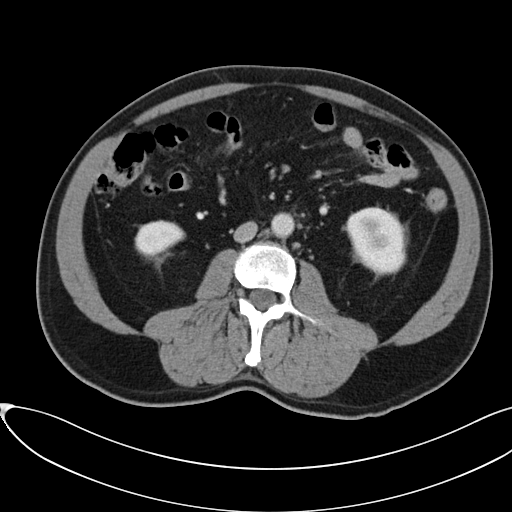
[im 61/102  soft-tissue]
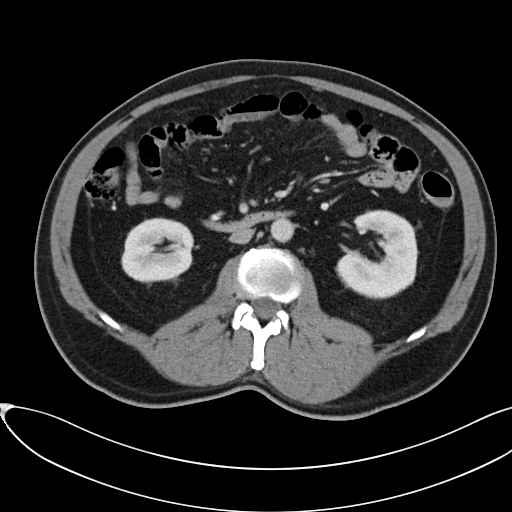
[im 71/102  soft-tissue]
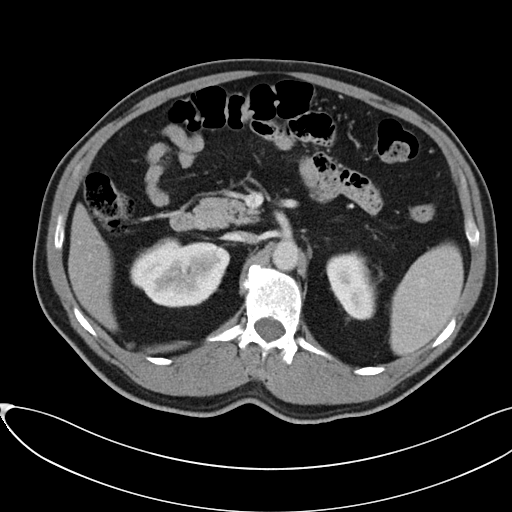
[im 71/102  bone]
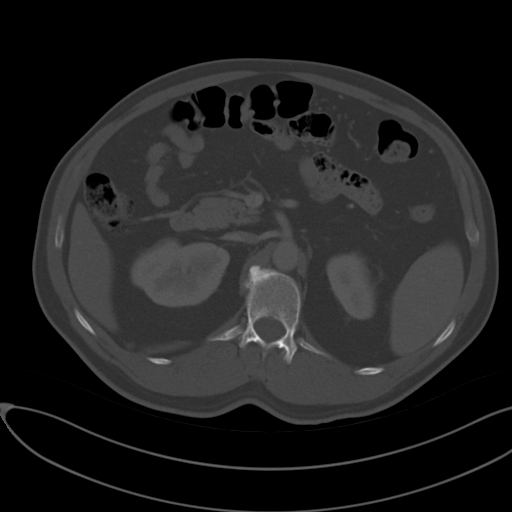
[im 81/102  soft-tissue]
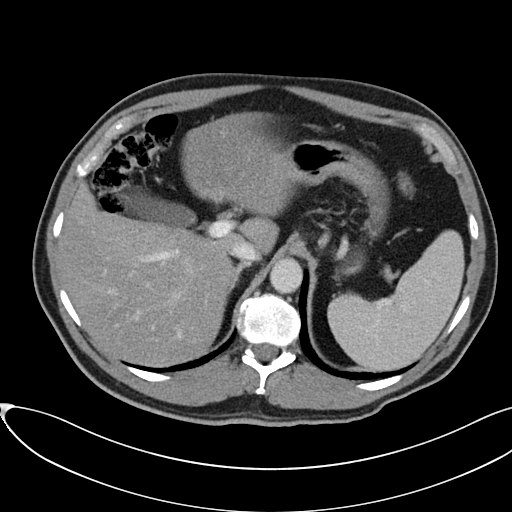
[im 86/102  soft-tissue]
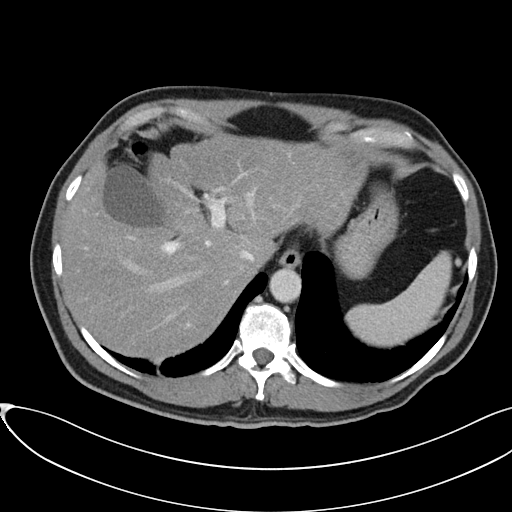
[im 96/102  soft-tissue]
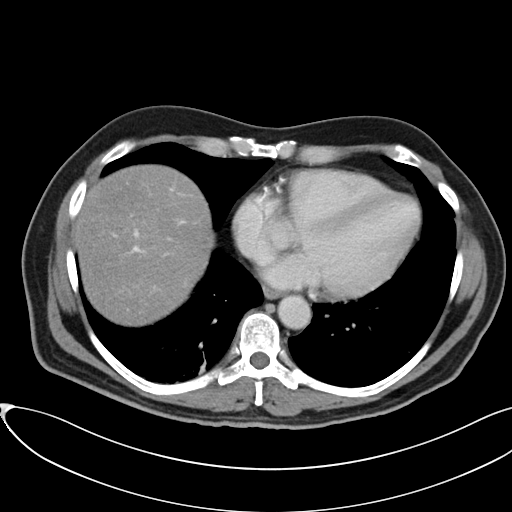

[Series 5: coronal soft tissue · coronal · 0.78mm/px · 3 of 102 slices shown]
[im 34/102  soft-tissue]
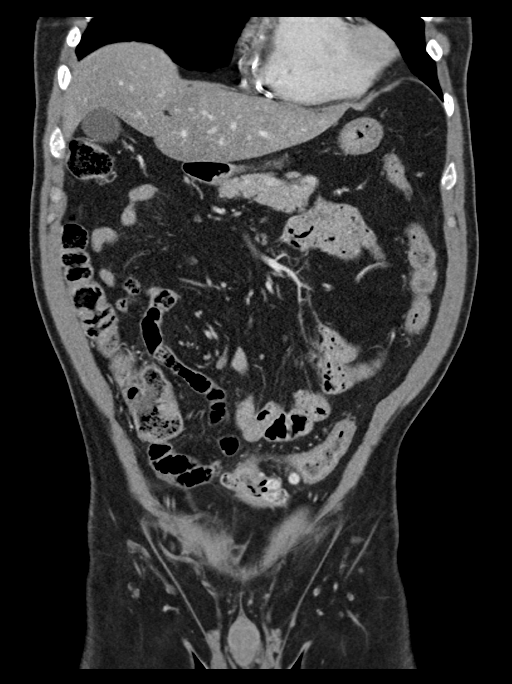
[im 45/102  soft-tissue]
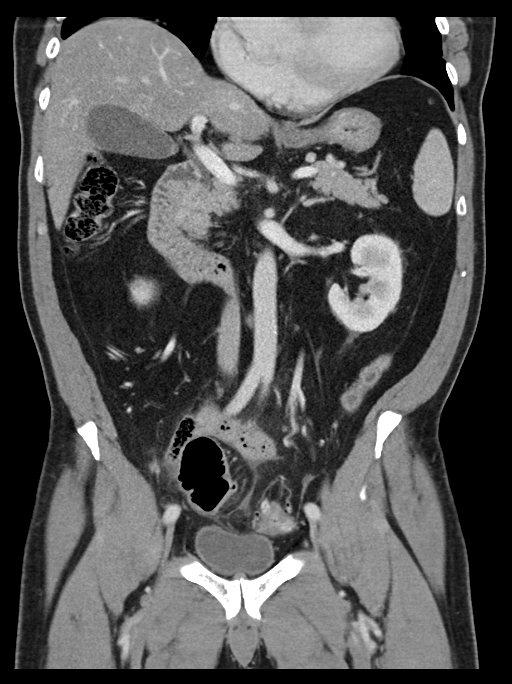
[im 57/102  soft-tissue]
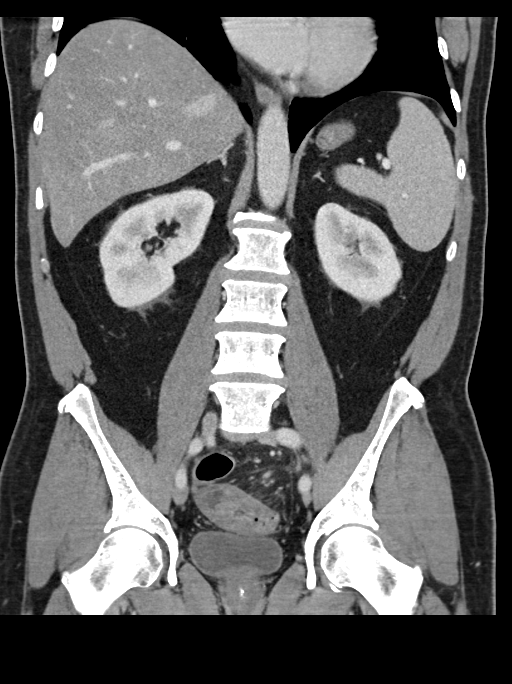

[15 of 46 positions shown; findings below may reference images not displayed]

FINDINGS: Lower chest: Mild bibasilar atelectasis is noted. Diffuse coronary
artery calcifications are seen.

Hepatobiliary: The liver is unremarkable in appearance. The
gallbladder is unremarkable in appearance. The common bile duct
remains normal in caliber.

Pancreas: The pancreas is within normal limits.

Spleen: The spleen is unremarkable in appearance.

Adrenals/Urinary Tract: The adrenal glands are unremarkable in
appearance. The kidneys are within normal limits. There is no
evidence of hydronephrosis. No renal or ureteral stones are
identified. Mild nonspecific perinephric stranding is noted
bilaterally.

Stomach/Bowel: The stomach is unremarkable in appearance. The small
bowel is within normal limits. The appendix is not visualized; there
is no evidence for appendicitis. The colon is unremarkable in
appearance.

Focal wall thickening is noted at the mid sigmoid colon, with
associated soft tissue inflammation and inflamed diverticula, and
trace underlying free fluid, compatible with acute diverticulitis.
There is no evidence of perforation or abscess formation at this
time.

Vascular/Lymphatic: Mild scattered calcification is seen along the
abdominal aorta and its branches. No retroperitoneal or pelvic
sidewall lymphadenopathy is seen.

Reproductive: The bladder is mildly distended and grossly
unremarkable. The prostate remains normal in size.

Other: No additional soft tissue abnormalities are seen.

Musculoskeletal: No acute osseous abnormalities are identified.
Sclerotic change is noted at the pubic symphysis. The visualized
musculature is unremarkable in appearance.
IMPRESSION: 1. Acute diverticulitis at the mid sigmoid colon, with soft tissue
inflammation, focal wall thickening and trace underlying free fluid.
No evidence of perforation or abscess formation at this time.
2. Mild bibasilar atelectasis noted.
3. Diffuse coronary artery calcifications seen.
4. Mild scattered aortic atherosclerosis.

## 2018-09-18 ENCOUNTER — Other Ambulatory Visit: Payer: Self-pay | Admitting: Cardiology

## 2018-09-30 ENCOUNTER — Telehealth: Payer: Self-pay | Admitting: *Deleted

## 2018-10-14 NOTE — Telephone Encounter (Signed)
error 

## 2018-10-15 ENCOUNTER — Other Ambulatory Visit: Payer: Self-pay | Admitting: Cardiology

## 2018-10-23 ENCOUNTER — Encounter: Payer: Self-pay | Admitting: *Deleted

## 2018-10-23 DIAGNOSIS — Z006 Encounter for examination for normal comparison and control in clinical research program: Secondary | ICD-10-CM

## 2018-10-23 NOTE — Research (Signed)
BIOFLOW V research study 5 year telephone follow up completed. Patient Denies any hospital admissions. States he has had some Shortness of breath with exertion/fatigue and and a stress test that was negative. He did experience some lightheadedness and sizziness with exercise around 01/10/2018 and after switching around medication this resolved. Also Had Hypertension at [place] 05/17/2018 states it was better about a week (05/30/2018) after Stress test.

## 2018-10-28 DIAGNOSIS — G4733 Obstructive sleep apnea (adult) (pediatric): Secondary | ICD-10-CM | POA: Diagnosis not present

## 2018-11-06 DIAGNOSIS — H6593 Unspecified nonsuppurative otitis media, bilateral: Secondary | ICD-10-CM | POA: Diagnosis not present

## 2018-11-06 DIAGNOSIS — Z6827 Body mass index (BMI) 27.0-27.9, adult: Secondary | ICD-10-CM | POA: Diagnosis not present

## 2018-11-15 DIAGNOSIS — H81399 Other peripheral vertigo, unspecified ear: Secondary | ICD-10-CM | POA: Diagnosis not present

## 2018-11-15 DIAGNOSIS — H6990 Unspecified Eustachian tube disorder, unspecified ear: Secondary | ICD-10-CM | POA: Diagnosis not present

## 2018-11-20 DIAGNOSIS — H47233 Glaucomatous optic atrophy, bilateral: Secondary | ICD-10-CM | POA: Diagnosis not present

## 2018-11-24 DIAGNOSIS — H81399 Other peripheral vertigo, unspecified ear: Secondary | ICD-10-CM | POA: Diagnosis not present

## 2018-11-27 ENCOUNTER — Other Ambulatory Visit: Payer: Self-pay | Admitting: Cardiology

## 2018-11-27 DIAGNOSIS — I10 Essential (primary) hypertension: Secondary | ICD-10-CM

## 2018-12-09 DIAGNOSIS — L570 Actinic keratosis: Secondary | ICD-10-CM | POA: Diagnosis not present

## 2018-12-09 DIAGNOSIS — L821 Other seborrheic keratosis: Secondary | ICD-10-CM | POA: Diagnosis not present

## 2018-12-09 DIAGNOSIS — L819 Disorder of pigmentation, unspecified: Secondary | ICD-10-CM | POA: Diagnosis not present

## 2018-12-27 ENCOUNTER — Other Ambulatory Visit: Payer: Self-pay | Admitting: Cardiology

## 2018-12-27 DIAGNOSIS — I10 Essential (primary) hypertension: Secondary | ICD-10-CM

## 2019-01-10 ENCOUNTER — Other Ambulatory Visit: Payer: Self-pay | Admitting: Cardiology

## 2019-01-27 ENCOUNTER — Other Ambulatory Visit: Payer: Self-pay | Admitting: Cardiology

## 2019-01-27 DIAGNOSIS — R0981 Nasal congestion: Secondary | ICD-10-CM | POA: Diagnosis not present

## 2019-01-27 DIAGNOSIS — H9042 Sensorineural hearing loss, unilateral, left ear, with unrestricted hearing on the contralateral side: Secondary | ICD-10-CM | POA: Diagnosis not present

## 2019-01-27 DIAGNOSIS — H8111 Benign paroxysmal vertigo, right ear: Secondary | ICD-10-CM | POA: Diagnosis not present

## 2019-01-27 DIAGNOSIS — G4733 Obstructive sleep apnea (adult) (pediatric): Secondary | ICD-10-CM | POA: Diagnosis not present

## 2019-01-27 DIAGNOSIS — I10 Essential (primary) hypertension: Secondary | ICD-10-CM

## 2019-01-30 DIAGNOSIS — H8111 Benign paroxysmal vertigo, right ear: Secondary | ICD-10-CM | POA: Diagnosis not present

## 2019-02-02 DIAGNOSIS — F5101 Primary insomnia: Secondary | ICD-10-CM | POA: Diagnosis not present

## 2019-02-02 DIAGNOSIS — I10 Essential (primary) hypertension: Secondary | ICD-10-CM | POA: Diagnosis not present

## 2019-02-02 DIAGNOSIS — Z79899 Other long term (current) drug therapy: Secondary | ICD-10-CM | POA: Diagnosis not present

## 2019-02-02 DIAGNOSIS — G629 Polyneuropathy, unspecified: Secondary | ICD-10-CM | POA: Diagnosis not present

## 2019-02-02 DIAGNOSIS — E1169 Type 2 diabetes mellitus with other specified complication: Secondary | ICD-10-CM | POA: Diagnosis not present

## 2019-02-06 ENCOUNTER — Other Ambulatory Visit: Payer: Self-pay | Admitting: Cardiology

## 2019-02-06 DIAGNOSIS — H8111 Benign paroxysmal vertigo, right ear: Secondary | ICD-10-CM | POA: Diagnosis not present

## 2019-02-06 NOTE — Telephone Encounter (Signed)
Rx request sent to pharmacy.  

## 2019-02-12 DIAGNOSIS — G4733 Obstructive sleep apnea (adult) (pediatric): Secondary | ICD-10-CM | POA: Diagnosis not present

## 2019-02-12 DIAGNOSIS — R0981 Nasal congestion: Secondary | ICD-10-CM | POA: Diagnosis not present

## 2019-02-12 DIAGNOSIS — J342 Deviated nasal septum: Secondary | ICD-10-CM | POA: Diagnosis not present

## 2019-02-12 DIAGNOSIS — J343 Hypertrophy of nasal turbinates: Secondary | ICD-10-CM | POA: Diagnosis not present

## 2019-03-06 DIAGNOSIS — J0121 Acute recurrent ethmoidal sinusitis: Secondary | ICD-10-CM | POA: Diagnosis not present

## 2019-03-06 DIAGNOSIS — J329 Chronic sinusitis, unspecified: Secondary | ICD-10-CM | POA: Diagnosis not present

## 2019-03-13 DIAGNOSIS — J343 Hypertrophy of nasal turbinates: Secondary | ICD-10-CM | POA: Diagnosis not present

## 2019-03-13 DIAGNOSIS — R0981 Nasal congestion: Secondary | ICD-10-CM | POA: Diagnosis not present

## 2019-03-13 DIAGNOSIS — R42 Dizziness and giddiness: Secondary | ICD-10-CM | POA: Diagnosis not present

## 2019-04-04 ENCOUNTER — Other Ambulatory Visit: Payer: Self-pay | Admitting: Cardiology

## 2019-04-04 DIAGNOSIS — I10 Essential (primary) hypertension: Secondary | ICD-10-CM

## 2019-04-24 DIAGNOSIS — R351 Nocturia: Secondary | ICD-10-CM | POA: Diagnosis not present

## 2019-04-24 DIAGNOSIS — N401 Enlarged prostate with lower urinary tract symptoms: Secondary | ICD-10-CM | POA: Diagnosis not present

## 2019-05-01 DIAGNOSIS — N5201 Erectile dysfunction due to arterial insufficiency: Secondary | ICD-10-CM | POA: Diagnosis not present

## 2019-05-01 DIAGNOSIS — N401 Enlarged prostate with lower urinary tract symptoms: Secondary | ICD-10-CM | POA: Diagnosis not present

## 2019-05-01 DIAGNOSIS — R81 Glycosuria: Secondary | ICD-10-CM | POA: Diagnosis not present

## 2019-05-01 DIAGNOSIS — R300 Dysuria: Secondary | ICD-10-CM | POA: Diagnosis not present

## 2019-05-14 ENCOUNTER — Telehealth: Payer: Self-pay | Admitting: Cardiology

## 2019-05-14 NOTE — Telephone Encounter (Signed)
New message   Pt c/o Shortness Of Breath: STAT if SOB developed within the last 24 hours or pt is noticeably SOB on the phone  1. Are you currently SOB (can you hear that pt is SOB on the phone)? No   2. How long have you been experiencing SOB? Per patient a couple of months  3. Are you SOB when sitting or when up moving around?moving around  4. Are you currently experiencing any other symptoms? Excessive sweating, heart palpitations

## 2019-05-14 NOTE — Telephone Encounter (Signed)
Spoke with patient. Patient reports he has been having some shortness of breath with exertion. In particular when he walks up his hill to check the mailbox.   Patient reports he also sweats more than usual and on occasion has palpitations with activity. Patient is not currently having SOB or palpitations. Patient denies chest pain, dizziness or lightheadedness.  Patient reports he discussed this with Dr. Herbie Baltimore previously and Dr. Herbie Baltimore told him to come for a visit if it starts to bother the patient. Patient calling to schedule an appointment to see Dr. Herbie Baltimore.    Patient scheduled for 2/17 at 3:20pm.

## 2019-05-18 DIAGNOSIS — Z79899 Other long term (current) drug therapy: Secondary | ICD-10-CM | POA: Diagnosis not present

## 2019-05-18 DIAGNOSIS — K219 Gastro-esophageal reflux disease without esophagitis: Secondary | ICD-10-CM | POA: Diagnosis not present

## 2019-05-18 DIAGNOSIS — E782 Mixed hyperlipidemia: Secondary | ICD-10-CM | POA: Diagnosis not present

## 2019-05-18 DIAGNOSIS — I10 Essential (primary) hypertension: Secondary | ICD-10-CM | POA: Diagnosis not present

## 2019-05-18 DIAGNOSIS — Z8639 Personal history of other endocrine, nutritional and metabolic disease: Secondary | ICD-10-CM | POA: Diagnosis not present

## 2019-05-18 DIAGNOSIS — I2510 Atherosclerotic heart disease of native coronary artery without angina pectoris: Secondary | ICD-10-CM | POA: Diagnosis not present

## 2019-05-20 ENCOUNTER — Other Ambulatory Visit: Payer: Self-pay

## 2019-05-20 ENCOUNTER — Ambulatory Visit: Payer: Federal, State, Local not specified - PPO | Admitting: Cardiology

## 2019-05-20 ENCOUNTER — Encounter: Payer: Self-pay | Admitting: Cardiology

## 2019-05-20 VITALS — BP 128/82 | HR 77 | Temp 97.6°F | Ht 70.0 in | Wt 195.0 lb

## 2019-05-20 DIAGNOSIS — I10 Essential (primary) hypertension: Secondary | ICD-10-CM | POA: Diagnosis not present

## 2019-05-20 DIAGNOSIS — R9439 Abnormal result of other cardiovascular function study: Secondary | ICD-10-CM

## 2019-05-20 DIAGNOSIS — E785 Hyperlipidemia, unspecified: Secondary | ICD-10-CM

## 2019-05-20 DIAGNOSIS — R0609 Other forms of dyspnea: Secondary | ICD-10-CM

## 2019-05-20 DIAGNOSIS — I209 Angina pectoris, unspecified: Secondary | ICD-10-CM

## 2019-05-20 DIAGNOSIS — I2510 Atherosclerotic heart disease of native coronary artery without angina pectoris: Secondary | ICD-10-CM | POA: Diagnosis not present

## 2019-05-20 DIAGNOSIS — Z9861 Coronary angioplasty status: Secondary | ICD-10-CM | POA: Diagnosis not present

## 2019-05-20 DIAGNOSIS — I2119 ST elevation (STEMI) myocardial infarction involving other coronary artery of inferior wall: Secondary | ICD-10-CM

## 2019-05-20 DIAGNOSIS — R0600 Dyspnea, unspecified: Secondary | ICD-10-CM | POA: Diagnosis not present

## 2019-05-20 MED ORDER — RANOLAZINE ER 500 MG PO TB12: 500.0000 mg | ORAL_TABLET | Freq: Two times a day (BID) | ORAL | 6 refills | Status: DC

## 2019-05-20 NOTE — Assessment & Plan Note (Signed)
Progressively worsening dyspnea from last year.  Myoview checked about a year ago was unfavorable as far as the results with no real clear diagnostic purpose. With progressively worsening symptoms, I think we do need to be definitive with ischemic evaluation making cardiac catheterization the most effective tool.

## 2019-05-20 NOTE — Assessment & Plan Note (Signed)
Status post two-vessel PCI in a staged fashion once in 2013 and with inferior STEMI and then in 2015 for progressive symptoms.  Now having some more symptoms are concerning for progressive atypical angina symptoms class II-III symptoms.  He is on clopidogrel standing dose along with amlodipine as antianginal.  Not on beta-blocker for reasons described. Is on rosuvastatin.  Plan: We will add Ranexa for additional antianginal benefit.Marland Kitchen  Now with worsening exertional dyspnea as possible anginal equivalent, we are planning ischemic evaluation with invasive cardiac catheterization since his Myoview was abnormal last year.

## 2019-05-20 NOTE — Assessment & Plan Note (Signed)
He really has some atypical symptoms, but I think his exertional dyspnea is not concerning to consider to be at least class III.  As this was very similar to his prior anginal equivalent with dyspnea fatigue as well as sweating, not unreasonable to consider this to be an atypical anginal symptom.   He tells me his symptoms are worse than the last visit which was considered to be like class II symptoms.  His Myoview was less than helpful when performed as there was a defect that was actually improved on stress versus rest images.  The read was read as intermediate risk, therefore I would hesitate to do another nuclear stress test.  With stents in place, coronary CTA would be not helpful.  And with existing baseline wall motion abnormality, stress echo would also not like to be helpful.  Plan:   He is on amlodipine, but not on beta-blocker because of fatigue.  We will add Ranexa 500 mg twice daily  Continue Plavix for now along with statin  As noted in my last clinic note, plan will be to proceed with diagnostic cardiac catheterization and possible PCI.

## 2019-05-20 NOTE — Assessment & Plan Note (Signed)
He had a stress test last year read as intermediate risk and now has progressively worsening symptoms.  Next option now is to proceed with cardiac catheterization.  Plan: LEFT HEART CATHETERIZATION WITH CORONARY ANGIOGRAPHY AND POSSIBLE PERCUTANEOUS CORONARY INTERVENTION

## 2019-05-20 NOTE — Patient Instructions (Addendum)
Medication Instructions:   start   Taking generic Ranexa 500 mg  Twice a day  Continue with all other medications  *If you need a refill on your cardiac medications before your next appointment, please call your pharmacy*  Lab Work: See instruction sheet for cath   Testing/Procedures: Schedule left heart cath at [facility]  cath lab _ June 02, 2019 Your physician has requested that you have a cardiac catheterization. Cardiac catheterization is used to diagnose and/or treat various heart conditions. Doctors may recommend this procedure for a number of different reasons. The most common reason is to evaluate chest pain. Chest pain can be a symptom of coronary artery disease (CAD), and cardiac catheterization can show whether plaque is narrowing or blocking your heart's arteries. This procedure is also used to evaluate the valves, as well as measure the blood flow and oxygen levels in different parts of your heart. For further information please visit [email or web address] Please follow instruction sheet, as given.    Follow-Up: At [facility], you and your health needs are our priority.  As part of our continuing mission to provide you with exceptional heart care, we have created designated Provider Care Teams.  These Care Teams include your primary Cardiologist (physician) and Advanced Practice Providers (APPs -  Physician Assistants and Nurse Practitioners) who all work together to provide you with the care you need, when you need it.  Your next appointment:   3 week(s)  The format for your next appointment:   In Person  Provider:   [name], MD  Other Instructions N/a      [facility] CARDIOVASCULAR DIVISION [facility] [address] [place] [address] [place] [address] Dept: [phone] Loc: [phone]  [name]  05/20/2019  You are scheduled for a Cardiac Catheterization on Tuesday, March 2 with Dr. [name].  1. Please arrive at the [facility] (Main Entrance A) at [facility]: [address] [place], [place] [address] at 10:00 AM (This time is two hours before your procedure to ensure your preparation). Free valet parking service is available.   Special note: Every effort is made to have your procedure done on time. Please understand that emergencies sometimes delay scheduled procedures.  2. Diet: Do not eat solid foods after midnight.  The patient may have clear liquids until 5am upon the day of the procedure.  3. Labs: You will need to have blood drawn on CBC.BMP  February 26 at [facility] [address], [place]  Open: 8am - 5pm (Lunch 12:30 - 1:30)   Phone: [phone]. You do not need to be fasting.   May 29 2019 -PLEASE GO TO [address] - FOR COVID TEST -- THEN SELF ISOLATE UNTIL CARDIAC CATH   4. Medication instructions in preparation for your procedure:  Do not take Losartan the morning of cardiac cath  On the morning of your procedure, take your Aspirin 81 and Plavix/Clopidogrel 75 and any morning medicines NOT listed above.  You may use sips of water.  5. Plan for one night stay--bring personal belongings. 6. Bring a current list of your medications and current insurance cards. 7. You MUST have a responsible person to drive you home. 8. Someone MUST be with you the first 24 hours after you arrive home or your discharge will be delayed. 9. Please wear clothes that are easy to get on and off and wear slip-on shoes.  Thank you for allowing [place]  to care for you!   -- [facility]

## 2019-05-20 NOTE — H&P (View-Only) (Signed)
Primary Care Provider: Philemon Kingdom, MD Cardiologist: [name], MD Electrophysiologist: None  Clinic Note: Chief Complaint  Patient presents with  . Follow-up    Concern for worsening atypical angina symptoms.  . Shortness of Breath  . Palpitations    HPI:     [name] is a 64 y.o. male with a PMH notable for prior STEMI with CAD-PCI who presents today for evaluation of exertional dyspnea with palpitations.  CADHistory:   Inferior MI with PCI to the RCA in 2013.   Crescendo angina in July 2015 -> abnormal Myoview --> cath showed severe mid LAD disease treated with DES stent. Also had D1 and circumflex disease evaluated with FFR --> nonsignificant. Medical therapy.  [name] was last seen on July 07, 2018 via telemedicine as follow-up from [facility] stress test ordered after May 14, 2018 visit with worsening exertional dyspnea uphill is up steps.  No chest pain or pressure.  He noted that he he was still having some shortness of breath walking uphill or longer walks.  But overall was feeling little better after stopping Naprosyn.  Felt that the dyspnea with significant exertion. ->  Myoview results was actually somewhat confounding and not helpful. --> Noted that he dyspnea improved with improving nasal congestion.  We did plan cardiac catheterization once COVID-19 restrictions lifted.  Recent Hospitalizations: None  Reviewed  CV studies:    The following studies were reviewed today: (if available, images/films reviewed: From Epic Chart or Care Everywhere) . None since last visit:   Interval History:   [name] presents today indicating that he sort of just stop doing things as much since I last saw him.  But what he notes now, may be because of the weather may be just because he is more deconditioned, but he notes that he is more profoundly dyspneic.  His wife is with him here today to ensure that he he mentions that simply walking  loops around the cul-de[place]sac, he will get pretty short of breath, fatigue to the point where he is sweating and feeling somewhat lightheaded.  He has to stop to catch his breath quite frequently.  This is now may be half as much as he used to be other do this past summer.  He thought over the summer it was mostly because of the heat, but now and even in the cold, he is having this concern.  He really does not notice any resting symptoms of dyspnea.  He has no resting or exertional chest discomfort.  What he notes is that when he is having hard time to breathe it just feels like a squeezing in his chest.  1 example of his progression of dyspnea is that he has to stop on the way up the driveway from the street if he is pulling his garbage can back.  This was never an issue for him before.  While he does not have any PND orthopnea, he does have some mild pedal edema.  No real claudication symptoms, but he does have some restless leg type symptoms.  He is Weird symptoms of stiffness in his muscles and constipation and fatigue that he thought may be related to potassium levels or calcium levels being off.  His wife describes the cortical restless leg type symptoms as being that he just is fidgety and that is always on the go, having to get up and move around.  Not able to sit down for long period time.  CV Review  of Symptoms (Summary): positive for - dyspnea on exertion and This is associated more with a squeezing in his chest as well as fatigue and and generalized exercise intolerance.  Sometimes with nausea and sweating. negative for - chest pain, edema, irregular heartbeat, orthopnea, palpitations, paroxysmal nocturnal dyspnea, rapid heart rate, shortness of breath or Syncope/near syncope, TIA/amaurosis fugax, claudication  The patient does not have symptoms concerning for COVID-19 infection (fever, chills, cough, or new shortness of breath).  The patient is practicing social distancing & Masking.     REVIEWED OF SYSTEMS   ROS   I have reviewed and (if needed) personally updated the patient's problem list, medications, allergies, past medical and surgical history, social and family history.   PAST MEDICAL HISTORY   Past Medical History:  Diagnosis Date  . Anxiety   . Arthritis    "mostly in my hands; sometimes other areas" (10/20/2013)  . Bipolar disorder (HCC)   . BPH (benign prostatic hyperplasia)   . CAD S/P percutaneous coronary angioplasty 06/2011, 09/2013   (Therapeutic P2Y12 on Plavix);; a) STEMI -mid RCA DES PCI; 2015: Class III angina (abnormal MYOVIEW w/ Ant-AntLat ischemia -> mid-distal LAD 95-99% (PCI: Biotronik AG 2 DES 2.5 x 13, 2.75 mm), D1 proximal 50-60% (FFR 0.86) with 60-70% in small branch (med management), mid circumflex 50-60% (FFR 1.0)   . Depression   . Dizziness of unknown cause January 2011   abn MRI, Neuro w/u Jan 2011  . Dyslipidemia, goal LDL below 70   . Essential hypertension   . GERD (gastroesophageal reflux disease)   . RA (rheumatoid arthritis) (HCC)   . Rheumatic fever 1969  . ST elevation myocardial infarction (STEMI) involving right coronary artery in recovery phase (HCC) 06/2011   100% occluded RCA; PCI - 3.0x24 Promus DES (3.5-3.6 mm) ; Modwith moderate LAD & Cx disease, EF 55-60% w/ basal inferoseptal HK confirmed by echo    PAST SURGICAL HISTORY   Past Surgical History:  Procedure Laterality Date  . LEFT HEART CATHETERIZATION WITH CORONARY ANGIOGRAM N/A 06/28/2011   Procedure: LEFT HEART CATHETERIZATION WITH CORONARY ANGIOGRAM;  Surgeon: Marykay Lex, MD;  Location: [facility] CATH LAB;  50% LAD after D1, D1 30%.  Small D2.  Large caliber LCx with (small OM1) major OM 2 and 3 followed by ABG circumflex with 40% focal lesion terminating with LPL 1.  100% mid RCA (DES PCI); beyond 100% RCA  - large PDA (double barrel) and small PAV-PL -EF 55 to 60% w/ midInf-LAf HK.  Marland Kitchen LEFT HEART CATHETERIZATION WITH CORONARY ANGIOGRAM N/A 10/20/2013    Procedure: LEFT HEART CATHETERIZATION WITH CORONARY ANGIOGRAM;  Surgeon: Marykay Lex, MD;  Location: [facility] CATH LAB;  Service: Cardiovascular;  Laterality: N/A;  . NM MYOVIEW LTD N/A 10/19/2013   INTERMEDIATE RISK, septal, anterior-anterolateral ischemia --> referred for cath the following day -- LAD LESION TREATED WITH DES PCI  . PERCUTANEOUS CORONARY STENT INTERVENTION (PCI-S) N/A 06/2011; 09/2013   a) 3/'13: 100%  RCA ->PCI 3.20x2 (3.6) Promus DES stent; 7/'15: mLAD PCI Biotronik AG DES 2.5 x13 2.75 mm), FFR D1 50-60% = 0.86 (small branch had ~70%), mCx ~50-60% FFR=1.0.  . PLANTAR FASCIA SURGERY Left 2004   "& neuroma"  . SHOULDER ARTHROSCOPY W/ ROTATOR CUFF REPAIR Left July 2011  . TRANSTHORACIC ECHOCARDIOGRAM N/A 05/2016   EF ~55%. Normal LV Size & function. NO RWMA (inferior HK no longer present). Mlid LVH - Gr 1 DD.. Mild LA dilation.     Myoview 05/23/2018 -> EF  estimated 45 to 50%.  Hypertensive response to exercise.  Medium sized moderate severity defect in the basal inferior mid inferolateral wall (read as intermediate risk) -> the defect actually appears to improve on stress imaging suggesting hibernating myocardium.  MEDICATIONS/ALLERGIES   Current Meds  Medication Sig  . amLODipine (NORVASC) 10 MG tablet Take 1 tablet (10 mg total) by mouth daily.  . clopidogrel (PLAVIX) 75 MG tablet TAKE 1 TABLET BY MOUTH DAILY  . Coenzyme Q10 (CO Q-10) 300 MG CAPS Take 300 mg by mouth daily.  . finasteride (PROSCAR) 5 MG tablet Take 5 mg by mouth daily.  . fluticasone (FLONASE) 50 MCG/ACT nasal spray Place 1 spray into the nose daily as needed. For allergies  . losartan (COZAAR) 25 MG tablet TAKE 1 TABLET BY MOUTH DAILY  . nitroGLYCERIN (NITROSTAT) 0.4 MG SL tablet Place 1 tablet (0.4 mg total) under the tongue every 5 (five) minutes as needed for chest pain.  . rosuvastatin (CRESTOR) 20 MG tablet TAKE 1 TABLET BY MOUTH ONCE DAILY  . [DISCONTINUED] atorvastatin (LIPITOR) 40 MG tablet TAKE 1  TABLET BY MOUTH DAILY  . [DISCONTINUED] gabapentin (NEURONTIN) 600 MG tablet Take 1 tablet by mouth as directed.     No Known Allergies  SOCIAL HISTORY/FAMILY HISTORY   Reviewed in Epic:  Pertinent findings: None  OBJCTIVE -PE, EKG, labs   Wt Readings from Last 3 Encounters:  05/20/19 195 lb (88.5 kg)  07/07/18 202 lb (91.6 kg)  05/23/18 204 lb (92.5 kg)    Physical Exam: BP 128/82 (BP Location: Left Arm, Patient Position: Sitting, Cuff Size: Normal)   Pulse 77   Temp 97.6 F (36.4 C)   Ht 5\' 10"  (1.778 m)   Wt 195 lb (88.5 kg)   BMI 27.98 kg/m  Physical Exam  Constitutional: He is oriented to person, place, and time. He appears well-developed and well-nourished. No distress.  Healthy-appearing.  Well-groomed.  HENT:  Head: Normocephalic and atraumatic.  Eyes: Pupils are equal, round, and reactive to light. Conjunctivae and EOM are normal.  Neck: Normal carotid pulses, no hepatojugular reflux and no JVD present. Carotid bruit is not present.  Cardiovascular: Normal rate, regular rhythm, normal heart sounds and intact distal pulses.  No extrasystoles are present. PMI is not displaced. Exam reveals no gallop and no friction rub.  Pulmonary/Chest: Effort normal and breath sounds normal. No respiratory distress. He has no wheezes. He has no rales.  Abdominal: Soft. Bowel sounds are normal. He exhibits no distension. There is no abdominal tenderness. There is no rebound.  No HSM.  Musculoskeletal:        General: No edema. Normal range of motion.     Cervical back: Normal range of motion and neck supple.  Neurological: He is alert and oriented to person, place, and time. No cranial nerve deficit.  Skin: Skin is warm and dry. No erythema.  Psychiatric: He has a normal mood and affect. His behavior is normal. Judgment and thought content normal.  Vitals reviewed.   Adult ECG Report  Rate: 77 ;  Rhythm: AV block.  Left atrial enlargement, borderline LVH.  Narrative  Interpretation: Stable EKG.  No ischemic changes.  No ischemic changes noted  Recent Labs: Is currently due for labs Lab Results  Component Value Date   CHOL 155 04/15/2018   HDL 40 04/15/2018   LDLCALC 86 04/15/2018   TRIG 146 04/15/2018   CHOLHDL 3.9 04/15/2018   Lab Results  Component Value Date   CREATININE 0.86  05/09/2016   BUN 22 05/09/2016   NA 142 05/09/2016   K 4.4 05/09/2016   CL 104 05/09/2016   CO2 27 05/09/2016   Lab Results  Component Value Date   TSH 0.841 10/16/2013    ASSESSMENT/PLAN    Problem List Items Addressed This Visit    History of ST elevation myocardial infarction (STEMI) of inferior wall (Chronic)    Inferior STEMI with occluded RCA treated with DES PCI.  Inferoseptal hypokinesis noted on echo, but no significant reduction in EF.  May be having some recurrence of anginal symptoms.  His anginal equivalent was really profound dyspnea.      Relevant Medications   ranolazine (RANEXA) 500 MG 12 hr tablet   Essential hypertension (Chronic)    Blood pressure actually is much better today.  We had to put him on amlodipine last visit up to 10 mg along with his losartan.  For now we will simply continue current dose of losartan and amlodipine.  Not on beta-blocker because of fatigue and bradycardia issues in the past.      Relevant Medications   ranolazine (RANEXA) 500 MG 12 hr tablet   CAD S/P PCI to RCA (2013) and LAD (2015); therapeutic P2Y12 for Plavix (Chronic)    Status post two-vessel PCI in a staged fashion once in 2013 and with inferior STEMI and then in 2015 for progressive symptoms.  Now having some more symptoms are concerning for progressive atypical angina symptoms class II-III symptoms.  He is on clopidogrel standing dose along with amlodipine as antianginal.  Not on beta-blocker for reasons described. Is on rosuvastatin.  Plan: We will add Ranexa for additional antianginal benefit.Marland Kitchen  Now with worsening exertional dyspnea as possible  anginal equivalent, we are planning ischemic evaluation with invasive cardiac catheterization since his Myoview was abnormal last year.       Relevant Medications   ranolazine (RANEXA) 500 MG 12 hr tablet   Other Relevant Orders   EKG 12-Lead   Basic metabolic panel   CBC   LEFT HEART CATHETERIZATION WITH CORONARY ANGIOGRAM   Dyslipidemia, goal LDL below 70 (Chronic)    Did not really have much benefit as far as leg fatigue and weakness with statin holiday.  He is now back on moderate dose rosuvastatin.  He is due for lipids to be checked which can be done potentially when he comes in for cath.  Adjust dose of rosuvastatin accordingly.  LDL is 86 last year.  May need to be a little more aggressive.      Relevant Medications   ranolazine (RANEXA) 500 MG 12 hr tablet   Angina, class III (HCC) -atypical symptoms manifested as exertional dyspnea, fatigue and sweating - Primary    He really has some atypical symptoms, but I think his exertional dyspnea is not concerning to consider to be at least class III.  As this was very similar to his prior anginal equivalent with dyspnea fatigue as well as sweating, not unreasonable to consider this to be an atypical anginal symptom.   He tells me his symptoms are worse than the last visit which was considered to be like class II symptoms.  His Myoview was less than helpful when performed as there was a defect that was actually improved on stress versus rest images.  The read was read as intermediate risk, therefore I would hesitate to do another nuclear stress test.  With stents in place, coronary CTA would be not helpful.  And with existing baseline  wall motion abnormality, stress echo would also not like to be helpful.  Plan:   He is on amlodipine, but not on beta-blocker because of fatigue.  We will add Ranexa 500 mg twice daily  Continue Plavix for now along with statin  As noted in my last clinic note, plan will be to proceed with diagnostic  cardiac catheterization and possible PCI.      Relevant Medications   ranolazine (RANEXA) 500 MG 12 hr tablet   Other Relevant Orders   EKG 12-Lead   Basic metabolic panel   CBC   LEFT HEART CATHETERIZATION WITH CORONARY ANGIOGRAM   Dyspnea on exertion    Progressively worsening dyspnea from last year.  Myoview checked about a year ago was unfavorable as far as the results with no real clear diagnostic purpose. With progressively worsening symptoms, I think we do need to be definitive with ischemic evaluation making cardiac catheterization the most effective tool.      Relevant Orders   EKG 12-Lead   Basic metabolic panel   CBC   LEFT HEART CATHETERIZATION WITH CORONARY ANGIOGRAM   Abnormal nuclear stress test    He had a stress test last year read as intermediate risk and now has progressively worsening symptoms.  Next option now is to proceed with cardiac catheterization.  Plan: LEFT HEART CATHETERIZATION WITH CORONARY ANGIOGRAPHY AND POSSIBLE PERCUTANEOUS CORONARY INTERVENTION      Relevant Orders   EKG 12-Lead   Basic metabolic panel   CBC   LEFT HEART CATHETERIZATION WITH CORONARY ANGIOGRAM     Performing MD:  [name], M.D., M.S.  Procedure: LEFT HEART CATHETERIZATION WITH NATIVE CORONARY GEOGRAPHY AND POSSIBLE PERCUTANEOUS CORONARY INTERVENTION  The procedure with Risks/Benefits/Alternatives and Indications was reviewed with the patient and his wife.  All questions were answered.    Risks / Complications include, but not limited to: Death, MI, CVA/TIA, VF/VT (with defibrillation), Bradycardia (need for temporary pacer placement), contrast induced nephropathy, bleeding / bruising / hematoma / pseudoaneurysm, vascular or coronary injury (with possible emergent CT or Vascular Surgery), adverse medication reactions, infection.  Additional risks involving the use of radiation with the possibility of radiation burns and cancer were explained in detail.  The patient (and  family) voice understanding and agree to proceed.     COVID-19 Education: The signs and symptoms of COVID-19 were discussed with the patient and how to seek care for testing (follow up with PCP or arrange E-visit).   The importance of social distancing was discussed today.  I spent a total of 28 minutes with the patient. >  50% of the time was spent in direct patient consultation.  Additional time spent with chart review  / charting (studies, outside notes, etc): 10 Total Time: 38 min   Current medicines are reviewed at length with the patient today.  (+/- concerns) n/a   Patient Instructions / Medication Changes & Studies & Tests Ordered   Patient Instructions  Medication Instructions:   start   Taking generic Ranexa 500 mg  Twice a day  Continue with all other medications  *If you need a refill on your cardiac medications before your next appointment, please call your pharmacy*  Lab Work: See instruction sheet for cath   Testing/Procedures: Schedule left heart cath at [facility]  cath lab _ June 02, 2019 Your physician has requested that you have a cardiac catheterization. Cardiac catheterization is used to diagnose and/or treat various heart conditions. Doctors may recommend this procedure for a number  of different reasons. The most common reason is to evaluate chest pain. Chest pain can be a symptom of coronary artery disease (CAD), and cardiac catheterization can show whether plaque is narrowing or blocking your heart's arteries. This procedure is also used to evaluate the valves, as well as measure the blood flow and oxygen levels in different parts of your heart. For further information please visit [email or web address] Please follow instruction sheet, as given.    Follow-Up: At [facility], you and your health needs are our priority.  As part of our continuing mission to provide you with exceptional heart care, we have created designated Provider Care Teams.  These  Care Teams include your primary Cardiologist (physician) and Advanced Practice Providers (APPs -  Physician Assistants and Nurse Practitioners) who all work together to provide you with the care you need, when you need it.  Your next appointment:   3 week(s)  The format for your next appointment:   In Person  Provider:   [name], MD  Other Instructions --Standard instructions for cardiac catheterization provided    Studies Ordered:   Orders Placed This Encounter  Procedures  . Basic metabolic panel  . CBC  . EKG 12-Lead  . LEFT HEART CATHETERIZATION WITH CORONARY Jocelyn Lamer, M.D., M.S. Interventional Cardiologist   Pager # [phone] Phone # [phone] [address] [place], [place] [address]   Thank you for choosing Heart[facility] at [facility]!!

## 2019-05-20 NOTE — Assessment & Plan Note (Signed)
Did not really have much benefit as far as leg fatigue and weakness with statin holiday.  He is now back on moderate dose rosuvastatin.  He is due for lipids to be checked which can be done potentially when he comes in for cath.  Adjust dose of rosuvastatin accordingly.  LDL is 86 last year.  May need to be a little more aggressive.

## 2019-05-20 NOTE — Assessment & Plan Note (Signed)
Inferior STEMI with occluded RCA treated with DES PCI.  Inferoseptal hypokinesis noted on echo, but no significant reduction in EF.  May be having some recurrence of anginal symptoms.  His anginal equivalent was really profound dyspnea.

## 2019-05-20 NOTE — Assessment & Plan Note (Signed)
Blood pressure actually is much better today.  We had to put him on amlodipine last visit up to 10 mg along with his losartan.  For now we will simply continue current dose of losartan and amlodipine.  Not on beta-blocker because of fatigue and bradycardia issues in the past.

## 2019-05-20 NOTE — Progress Notes (Signed)
Primary Care Provider: Philemon Kingdom, MD Cardiologist: [name], MD Electrophysiologist: None  Clinic Note: Chief Complaint  Patient presents with  . Follow-up    Concern for worsening atypical angina symptoms.  . Shortness of Breath  . Palpitations    HPI:     [name] is a 64 y.o. male with a PMH notable for prior STEMI with CAD-PCI who presents today for evaluation of exertional dyspnea with palpitations.  CADHistory:   Inferior MI with PCI to the RCA in 2013.   Crescendo angina in July 2015 -> abnormal Myoview --> cath showed severe mid LAD disease treated with DES stent. Also had D1 and circumflex disease evaluated with FFR --> nonsignificant. Medical therapy.  [name] was last seen on July 07, 2018 via telemedicine as follow-up from [facility] stress test ordered after May 14, 2018 visit with worsening exertional dyspnea uphill is up steps.  No chest pain or pressure.  He noted that he he was still having some shortness of breath walking uphill or longer walks.  But overall was feeling little better after stopping Naprosyn.  Felt that the dyspnea with significant exertion. ->  Myoview results was actually somewhat confounding and not helpful. --> Noted that he dyspnea improved with improving nasal congestion.  We did plan cardiac catheterization once COVID-19 restrictions lifted.  Recent Hospitalizations: None  Reviewed  CV studies:    The following studies were reviewed today: (if available, images/films reviewed: From Epic Chart or Care Everywhere) . None since last visit:   Interval History:   [name] presents today indicating that he sort of just stop doing things as much since I last saw him.  But what he notes now, may be because of the weather may be just because he is more deconditioned, but he notes that he is more profoundly dyspneic.  His wife is with him here today to ensure that he he mentions that simply walking  loops around the cul-de-sac, he will get pretty short of breath, fatigue to the point where he is sweating and feeling somewhat lightheaded.  He has to stop to catch his breath quite frequently.  This is now may be half as much as he used to be other do this past summer.  He thought over the summer it was mostly because of the heat, but now and even in the cold, he is having this concern.  He really does not notice any resting symptoms of dyspnea.  He has no resting or exertional chest discomfort.  What he notes is that when he is having hard time to breathe it just feels like a squeezing in his chest.  1 example of his progression of dyspnea is that he has to stop on the way up the driveway from the street if he is pulling his garbage can back.  This was never an issue for him before.  While he does not have any PND orthopnea, he does have some mild pedal edema.  No real claudication symptoms, but he does have some restless leg type symptoms.  He is Weird symptoms of stiffness in his muscles and constipation and fatigue that he thought may be related to potassium levels or calcium levels being off.  His wife describes the cortical restless leg type symptoms as being that he just is fidgety and that is always on the go, having to get up and move around.  Not able to sit down for long period time.  CV Review  of Symptoms (Summary): positive for - dyspnea on exertion and This is associated more with a squeezing in his chest as well as fatigue and and generalized exercise intolerance.  Sometimes with nausea and sweating. negative for - chest pain, edema, irregular heartbeat, orthopnea, palpitations, paroxysmal nocturnal dyspnea, rapid heart rate, shortness of breath or Syncope/near syncope, TIA/amaurosis fugax, claudication  The patient does not have symptoms concerning for COVID-19 infection (fever, chills, cough, or new shortness of breath).  The patient is practicing social distancing & Masking.     REVIEWED OF SYSTEMS   ROS   I have reviewed and (if needed) personally updated the patient's problem list, medications, allergies, past medical and surgical history, social and family history.   PAST MEDICAL HISTORY   Past Medical History:  Diagnosis Date  . Anxiety   . Arthritis    "mostly in my hands; sometimes other areas" (10/20/2013)  . Bipolar disorder (HCC)   . BPH (benign prostatic hyperplasia)   . CAD S/P percutaneous coronary angioplasty 06/2011, 09/2013   (Therapeutic P2Y12 on Plavix);; a) STEMI -mid RCA DES PCI; 2015: Class III angina (abnormal MYOVIEW w/ Ant-AntLat ischemia -> mid-distal LAD 95-99% (PCI: Biotronik AG 2 DES 2.5 x 13, 2.75 mm), D1 proximal 50-60% (FFR 0.86) with 60-70% in small branch (med management), mid circumflex 50-60% (FFR 1.0)   . Depression   . Dizziness of unknown cause January 2011   abn MRI, Neuro w/u Jan 2011  . Dyslipidemia, goal LDL below 70   . Essential hypertension   . GERD (gastroesophageal reflux disease)   . RA (rheumatoid arthritis) (HCC)   . Rheumatic fever 1969  . ST elevation myocardial infarction (STEMI) involving right coronary artery in recovery phase (HCC) 06/2011   100% occluded RCA; PCI - 3.0x24 Promus DES (3.5-3.6 mm) ; Modwith moderate LAD & Cx disease, EF 55-60% w/ basal inferoseptal HK confirmed by echo    PAST SURGICAL HISTORY   Past Surgical History:  Procedure Laterality Date  . LEFT HEART CATHETERIZATION WITH CORONARY ANGIOGRAM N/A 06/28/2011   Procedure: LEFT HEART CATHETERIZATION WITH CORONARY ANGIOGRAM;  Surgeon: Tirso Laws W Okey Zelek, MD;  Location: [facility] CATH LAB;  50% LAD after D1, D1 30%.  Small D2.  Large caliber LCx with (small OM1) major OM 2 and 3 followed by ABG circumflex with 40% focal lesion terminating with LPL 1.  100% mid RCA (DES PCI); beyond 100% RCA  - large PDA (double barrel) and small PAV-PL -EF 55 to 60% w/ midInf-LAf HK.  . LEFT HEART CATHETERIZATION WITH CORONARY ANGIOGRAM N/A 10/20/2013    Procedure: LEFT HEART CATHETERIZATION WITH CORONARY ANGIOGRAM;  Surgeon: Tres Grzywacz W Melissia Lahman, MD;  Location: [facility] CATH LAB;  Service: Cardiovascular;  Laterality: N/A;  . NM MYOVIEW LTD N/A 10/19/2013   INTERMEDIATE RISK, septal, anterior-anterolateral ischemia --> referred for cath the following day -- LAD LESION TREATED WITH DES PCI  . PERCUTANEOUS CORONARY STENT INTERVENTION (PCI-S) N/A 06/2011; 09/2013   a) 3/'13: 100%  RCA ->PCI 3.20x2 (3.6) Promus DES stent; 7/'15: mLAD PCI Biotronik AG DES 2.5 x13 2.75 mm), FFR D1 50-60% = 0.86 (small branch had ~70%), mCx ~50-60% FFR=1.0.  . PLANTAR FASCIA SURGERY Left 2004   "& neuroma"  . SHOULDER ARTHROSCOPY W/ ROTATOR CUFF REPAIR Left July 2011  . TRANSTHORACIC ECHOCARDIOGRAM N/A 05/2016   EF ~55%. Normal LV Size & function. NO RWMA (inferior HK no longer present). Mlid LVH - Gr 1 DD.. Mild LA dilation.     Myoview 05/23/2018 -> EF   estimated 45 to 50%.  Hypertensive response to exercise.  Medium sized moderate severity defect in the basal inferior mid inferolateral wall (read as intermediate risk) -> the defect actually appears to improve on stress imaging suggesting hibernating myocardium.  MEDICATIONS/ALLERGIES   Current Meds  Medication Sig  . amLODipine (NORVASC) 10 MG tablet Take 1 tablet (10 mg total) by mouth daily.  . clopidogrel (PLAVIX) 75 MG tablet TAKE 1 TABLET BY MOUTH DAILY  . Coenzyme Q10 (CO Q-10) 300 MG CAPS Take 300 mg by mouth daily.  . finasteride (PROSCAR) 5 MG tablet Take 5 mg by mouth daily.  . fluticasone (FLONASE) 50 MCG/ACT nasal spray Place 1 spray into the nose daily as needed. For allergies  . losartan (COZAAR) 25 MG tablet TAKE 1 TABLET BY MOUTH DAILY  . nitroGLYCERIN (NITROSTAT) 0.4 MG SL tablet Place 1 tablet (0.4 mg total) under the tongue every 5 (five) minutes as needed for chest pain.  . rosuvastatin (CRESTOR) 20 MG tablet TAKE 1 TABLET BY MOUTH ONCE DAILY  . [DISCONTINUED] atorvastatin (LIPITOR) 40 MG tablet TAKE 1  TABLET BY MOUTH DAILY  . [DISCONTINUED] gabapentin (NEURONTIN) 600 MG tablet Take 1 tablet by mouth as directed.     No Known Allergies  SOCIAL HISTORY/FAMILY HISTORY   Reviewed in Epic:  Pertinent findings: None  OBJCTIVE -PE, EKG, labs   Wt Readings from Last 3 Encounters:  05/20/19 195 lb (88.5 kg)  07/07/18 202 lb (91.6 kg)  05/23/18 204 lb (92.5 kg)    Physical Exam: BP 128/82 (BP Location: Left Arm, Patient Position: Sitting, Cuff Size: Normal)   Pulse 77   Temp 97.6 F (36.4 C)   Ht 5\' 10"  (1.778 m)   Wt 195 lb (88.5 kg)   BMI 27.98 kg/m  Physical Exam  Constitutional: He is oriented to person, place, and time. He appears well-developed and well-nourished. No distress.  Healthy-appearing.  Well-groomed.  HENT:  Head: Normocephalic and atraumatic.  Eyes: Pupils are equal, round, and reactive to light. Conjunctivae and EOM are normal.  Neck: Normal carotid pulses, no hepatojugular reflux and no JVD present. Carotid bruit is not present.  Cardiovascular: Normal rate, regular rhythm, normal heart sounds and intact distal pulses.  No extrasystoles are present. PMI is not displaced. Exam reveals no gallop and no friction rub.  Pulmonary/Chest: Effort normal and breath sounds normal. No respiratory distress. He has no wheezes. He has no rales.  Abdominal: Soft. Bowel sounds are normal. He exhibits no distension. There is no abdominal tenderness. There is no rebound.  No HSM.  Musculoskeletal:        General: No edema. Normal range of motion.     Cervical back: Normal range of motion and neck supple.  Neurological: He is alert and oriented to person, place, and time. No cranial nerve deficit.  Skin: Skin is warm and dry. No erythema.  Psychiatric: He has a normal mood and affect. His behavior is normal. Judgment and thought content normal.  Vitals reviewed.   Adult ECG Report  Rate: 77 ;  Rhythm: AV block.  Left atrial enlargement, borderline LVH.  Narrative  Interpretation: Stable EKG.  No ischemic changes.  No ischemic changes noted  Recent Labs: Is currently due for labs Lab Results  Component Value Date   CHOL 155 04/15/2018   HDL 40 04/15/2018   LDLCALC 86 04/15/2018   TRIG 146 04/15/2018   CHOLHDL 3.9 04/15/2018   Lab Results  Component Value Date   CREATININE 0.86  05/09/2016   BUN 22 05/09/2016   NA 142 05/09/2016   K 4.4 05/09/2016   CL 104 05/09/2016   CO2 27 05/09/2016   Lab Results  Component Value Date   TSH 0.841 10/16/2013    ASSESSMENT/PLAN    Problem List Items Addressed This Visit    History of ST elevation myocardial infarction (STEMI) of inferior wall (Chronic)    Inferior STEMI with occluded RCA treated with DES PCI.  Inferoseptal hypokinesis noted on echo, but no significant reduction in EF.  May be having some recurrence of anginal symptoms.  His anginal equivalent was really profound dyspnea.      Relevant Medications   ranolazine (RANEXA) 500 MG 12 hr tablet   Essential hypertension (Chronic)    Blood pressure actually is much better today.  We had to put him on amlodipine last visit up to 10 mg along with his losartan.  For now we will simply continue current dose of losartan and amlodipine.  Not on beta-blocker because of fatigue and bradycardia issues in the past.      Relevant Medications   ranolazine (RANEXA) 500 MG 12 hr tablet   CAD S/P PCI to RCA (2013) and LAD (2015); therapeutic P2Y12 for Plavix (Chronic)    Status post two-vessel PCI in a staged fashion once in 2013 and with inferior STEMI and then in 2015 for progressive symptoms.  Now having some more symptoms are concerning for progressive atypical angina symptoms class II-III symptoms.  He is on clopidogrel standing dose along with amlodipine as antianginal.  Not on beta-blocker for reasons described. Is on rosuvastatin.  Plan: We will add Ranexa for additional antianginal benefit.Marland Kitchen  Now with worsening exertional dyspnea as possible  anginal equivalent, we are planning ischemic evaluation with invasive cardiac catheterization since his Myoview was abnormal last year.       Relevant Medications   ranolazine (RANEXA) 500 MG 12 hr tablet   Other Relevant Orders   EKG 12-Lead   Basic metabolic panel   CBC   LEFT HEART CATHETERIZATION WITH CORONARY ANGIOGRAM   Dyslipidemia, goal LDL below 70 (Chronic)    Did not really have much benefit as far as leg fatigue and weakness with statin holiday.  He is now back on moderate dose rosuvastatin.  He is due for lipids to be checked which can be done potentially when he comes in for cath.  Adjust dose of rosuvastatin accordingly.  LDL is 86 last year.  May need to be a little more aggressive.      Relevant Medications   ranolazine (RANEXA) 500 MG 12 hr tablet   Angina, class III (HCC) -atypical symptoms manifested as exertional dyspnea, fatigue and sweating - Primary    He really has some atypical symptoms, but I think his exertional dyspnea is not concerning to consider to be at least class III.  As this was very similar to his prior anginal equivalent with dyspnea fatigue as well as sweating, not unreasonable to consider this to be an atypical anginal symptom.   He tells me his symptoms are worse than the last visit which was considered to be like class II symptoms.  His Myoview was less than helpful when performed as there was a defect that was actually improved on stress versus rest images.  The read was read as intermediate risk, therefore I would hesitate to do another nuclear stress test.  With stents in place, coronary CTA would be not helpful.  And with existing baseline  wall motion abnormality, stress echo would also not like to be helpful.  Plan:   He is on amlodipine, but not on beta-blocker because of fatigue.  We will add Ranexa 500 mg twice daily  Continue Plavix for now along with statin  As noted in my last clinic note, plan will be to proceed with diagnostic  cardiac catheterization and possible PCI.      Relevant Medications   ranolazine (RANEXA) 500 MG 12 hr tablet   Other Relevant Orders   EKG 12-Lead   Basic metabolic panel   CBC   LEFT HEART CATHETERIZATION WITH CORONARY ANGIOGRAM   Dyspnea on exertion    Progressively worsening dyspnea from last year.  Myoview checked about a year ago was unfavorable as far as the results with no real clear diagnostic purpose. With progressively worsening symptoms, I think we do need to be definitive with ischemic evaluation making cardiac catheterization the most effective tool.      Relevant Orders   EKG 12-Lead   Basic metabolic panel   CBC   LEFT HEART CATHETERIZATION WITH CORONARY ANGIOGRAM   Abnormal nuclear stress test    He had a stress test last year read as intermediate risk and now has progressively worsening symptoms.  Next option now is to proceed with cardiac catheterization.  Plan: LEFT HEART CATHETERIZATION WITH CORONARY ANGIOGRAPHY AND POSSIBLE PERCUTANEOUS CORONARY INTERVENTION      Relevant Orders   EKG 12-Lead   Basic metabolic panel   CBC   LEFT HEART CATHETERIZATION WITH CORONARY ANGIOGRAM     Performing MD:  [name], M.D., M.S.  Procedure: LEFT HEART CATHETERIZATION WITH NATIVE CORONARY GEOGRAPHY AND POSSIBLE PERCUTANEOUS CORONARY INTERVENTION  The procedure with Risks/Benefits/Alternatives and Indications was reviewed with the patient and his wife.  All questions were answered.    Risks / Complications include, but not limited to: Death, MI, CVA/TIA, VF/VT (with defibrillation), Bradycardia (need for temporary pacer placement), contrast induced nephropathy, bleeding / bruising / hematoma / pseudoaneurysm, vascular or coronary injury (with possible emergent CT or Vascular Surgery), adverse medication reactions, infection.  Additional risks involving the use of radiation with the possibility of radiation burns and cancer were explained in detail.  The patient (and  family) voice understanding and agree to proceed.     COVID-19 Education: The signs and symptoms of COVID-19 were discussed with the patient and how to seek care for testing (follow up with PCP or arrange E-visit).   The importance of social distancing was discussed today.  I spent a total of 28 minutes with the patient. >  50% of the time was spent in direct patient consultation.  Additional time spent with chart review  / charting (studies, outside notes, etc): 10 Total Time: 38 min   Current medicines are reviewed at length with the patient today.  (+/- concerns) n/a   Patient Instructions / Medication Changes & Studies & Tests Ordered   Patient Instructions  Medication Instructions:   start   Taking generic Ranexa 500 mg  Twice a day  Continue with all other medications  *If you need a refill on your cardiac medications before your next appointment, please call your pharmacy*  Lab Work: See instruction sheet for cath   Testing/Procedures: Schedule left heart cath at [facility]  cath lab _ June 02, 2019 Your physician has requested that you have a cardiac catheterization. Cardiac catheterization is used to diagnose and/or treat various heart conditions. Doctors may recommend this procedure for a number  of different reasons. The most common reason is to evaluate chest pain. Chest pain can be a symptom of coronary artery disease (CAD), and cardiac catheterization can show whether plaque is narrowing or blocking your heart's arteries. This procedure is also used to evaluate the valves, as well as measure the blood flow and oxygen levels in different parts of your heart. For further information please visit [email or web address] Please follow instruction sheet, as given.    Follow-Up: At [facility], you and your health needs are our priority.  As part of our continuing mission to provide you with exceptional heart care, we have created designated Provider Care Teams.  These  Care Teams include your primary Cardiologist (physician) and Advanced Practice Providers (APPs -  Physician Assistants and Nurse Practitioners) who all work together to provide you with the care you need, when you need it.  Your next appointment:   3 week(s)  The format for your next appointment:   In Person  Provider:   [name], MD  Other Instructions --Standard instructions for cardiac catheterization provided    Studies Ordered:   Orders Placed This Encounter  Procedures  . Basic metabolic panel  . CBC  . EKG 12-Lead  . LEFT HEART CATHETERIZATION WITH CORONARY Jocelyn Lamer, M.D., M.S. Interventional Cardiologist   Pager # [phone] Phone # [phone] [address] [place], [place] [address]   Thank you for choosing Heart[facility] at [facility]!!

## 2019-05-21 LAB — BASIC METABOLIC PANEL
BUN/Creatinine Ratio: 24 (ref 10–24)
BUN: 21 mg/dL (ref 8–27)
CO2: 24 mmol/L (ref 20–29)
Calcium: 9.7 mg/dL (ref 8.6–10.2)
Chloride: 101 mmol/L (ref 96–106)
Creatinine, Ser: 0.89 mg/dL (ref 0.76–1.27)
GFR calc Af Amer: 104 mL/min/{1.73_m2} (ref >59)
GFR calc non Af Amer: 90 mL/min/{1.73_m2} (ref >59)
Glucose: 97 mg/dL (ref 65–99)
Potassium: 4.3 mmol/L (ref 3.5–5.2)
Sodium: 141 mmol/L (ref 134–144)

## 2019-05-21 LAB — CBC
Hematocrit: 42 % (ref 37.5–51.0)
Hemoglobin: 15.4 g/dL (ref 13.0–17.7)
MCH: 30.9 pg (ref 26.6–33.0)
MCHC: 36.7 g/dL — ABNORMAL HIGH (ref 31.5–35.7)
MCV: 84 fL (ref 79–97)
Platelets: 173 10*3/uL (ref 150–450)
RBC: 4.98 x10E6/uL (ref 4.14–5.80)
RDW: 13.1 % (ref 11.6–15.4)
WBC: 6 10*3/uL (ref 3.4–10.8)

## 2019-05-29 ENCOUNTER — Other Ambulatory Visit (HOSPITAL_COMMUNITY)
Admission: RE | Admit: 2019-05-29 | Discharge: 2019-05-29 | Disposition: A | Discharge status: Home / Self Care | Payer: Federal, State, Local not specified - PPO | Source: Ambulatory Visit | Attending: Cardiology | Admitting: Cardiology

## 2019-05-29 DIAGNOSIS — Z20822 Contact with and (suspected) exposure to covid-19: Secondary | ICD-10-CM | POA: Insufficient documentation

## 2019-05-29 DIAGNOSIS — Z01812 Encounter for preprocedural laboratory examination: Secondary | ICD-10-CM | POA: Diagnosis not present

## 2019-05-29 LAB — SARS CORONAVIRUS 2 (TAT 6-24 HRS): SARS Coronavirus 2: NEGATIVE

## 2019-06-01 ENCOUNTER — Telehealth: Payer: Self-pay | Admitting: *Deleted

## 2019-06-01 NOTE — Telephone Encounter (Signed)
Pt contacted pre-catheterization scheduled at [facility] for: Tuesday June 02, 2019 12 Noon Verified arrival time and place: [facility] Main Entrance [facility]) at: 10 AM   No solid food after midnight prior to cath, clear liquids until 5 AM day of procedure. Contrast allergy: no  AM meds can be  taken pre-cath with sip of water including: ASA 81 mg Plavix 75 mg  Confirmed patient has responsible adult to drive home post procedure and observe 24 hours after arriving home: yes  Currently, due to Covid-19 pandemic, only one person will be allowed with patient. Must be the same person for patient's entire stay and will be required to wear a mask. They will be asked to wait in the waiting room for the duration of the patient's stay.  Patients are required to wear a mask when they enter the hospital.      COVID-19 Pre-Screening Questions:  . In the past 7 to 10 days have you had a cough,  shortness of breath, headache, congestion, fever (100 or greater) body aches, chills, sore throat, or sudden loss of taste or sense of smell? no . Have you been around anyone with known Covid 19 in the past 7-10 days? no . Have you been around anyone who is awaiting Covid 19 test results in the past 7 to 10 days? no . Have you been around anyone who has been exposed to Covid 19, or has mentioned symptoms of Covid 19 within the past 7 to 10 days? No  I reviewed procedure/mask/visitor instructions, COVID-19 screening questions with patient, he verbalized understanding, thanked me for call.

## 2019-06-02 ENCOUNTER — Other Ambulatory Visit: Payer: Self-pay

## 2019-06-02 ENCOUNTER — Encounter (HOSPITAL_COMMUNITY)
Admission: RE | Disposition: A | Discharge status: Home / Self Care | Payer: Self-pay | Source: Home / Self Care | Attending: Cardiology

## 2019-06-02 ENCOUNTER — Ambulatory Visit (HOSPITAL_COMMUNITY)
Admission: RE | Admit: 2019-06-02 | Discharge: 2019-06-02 | Disposition: A | Discharge status: Home / Self Care | Payer: Federal, State, Local not specified - PPO | Attending: Cardiology | Admitting: Cardiology

## 2019-06-02 DIAGNOSIS — I209 Angina pectoris, unspecified: Secondary | ICD-10-CM | POA: Diagnosis not present

## 2019-06-02 DIAGNOSIS — Z79899 Other long term (current) drug therapy: Secondary | ICD-10-CM | POA: Insufficient documentation

## 2019-06-02 DIAGNOSIS — Z955 Presence of coronary angioplasty implant and graft: Secondary | ICD-10-CM | POA: Diagnosis not present

## 2019-06-02 DIAGNOSIS — Z9861 Coronary angioplasty status: Secondary | ICD-10-CM

## 2019-06-02 DIAGNOSIS — M19041 Primary osteoarthritis, right hand: Secondary | ICD-10-CM | POA: Diagnosis not present

## 2019-06-02 DIAGNOSIS — Z7902 Long term (current) use of antithrombotics/antiplatelets: Secondary | ICD-10-CM | POA: Diagnosis not present

## 2019-06-02 DIAGNOSIS — I208 Other forms of angina pectoris: Secondary | ICD-10-CM | POA: Diagnosis present

## 2019-06-02 DIAGNOSIS — I10 Essential (primary) hypertension: Secondary | ICD-10-CM | POA: Diagnosis not present

## 2019-06-02 DIAGNOSIS — K219 Gastro-esophageal reflux disease without esophagitis: Secondary | ICD-10-CM | POA: Diagnosis not present

## 2019-06-02 DIAGNOSIS — I25110 Atherosclerotic heart disease of native coronary artery with unstable angina pectoris: Secondary | ICD-10-CM

## 2019-06-02 DIAGNOSIS — M19042 Primary osteoarthritis, left hand: Secondary | ICD-10-CM | POA: Insufficient documentation

## 2019-06-02 DIAGNOSIS — N400 Enlarged prostate without lower urinary tract symptoms: Secondary | ICD-10-CM | POA: Diagnosis not present

## 2019-06-02 DIAGNOSIS — E785 Hyperlipidemia, unspecified: Secondary | ICD-10-CM | POA: Diagnosis not present

## 2019-06-02 DIAGNOSIS — M069 Rheumatoid arthritis, unspecified: Secondary | ICD-10-CM | POA: Diagnosis not present

## 2019-06-02 DIAGNOSIS — I2584 Coronary atherosclerosis due to calcified coronary lesion: Secondary | ICD-10-CM | POA: Insufficient documentation

## 2019-06-02 DIAGNOSIS — R9439 Abnormal result of other cardiovascular function study: Secondary | ICD-10-CM | POA: Diagnosis not present

## 2019-06-02 DIAGNOSIS — I2510 Atherosclerotic heart disease of native coronary artery without angina pectoris: Secondary | ICD-10-CM

## 2019-06-02 DIAGNOSIS — I2089 Other forms of angina pectoris: Secondary | ICD-10-CM | POA: Diagnosis present

## 2019-06-02 DIAGNOSIS — R0609 Other forms of dyspnea: Secondary | ICD-10-CM

## 2019-06-02 DIAGNOSIS — I25119 Atherosclerotic heart disease of native coronary artery with unspecified angina pectoris: Secondary | ICD-10-CM | POA: Insufficient documentation

## 2019-06-02 DIAGNOSIS — I252 Old myocardial infarction: Secondary | ICD-10-CM | POA: Diagnosis not present

## 2019-06-02 DIAGNOSIS — R0600 Dyspnea, unspecified: Secondary | ICD-10-CM

## 2019-06-02 HISTORY — PX: LEFT HEART CATH AND CORONARY ANGIOGRAPHY: CATH118249

## 2019-06-02 HISTORY — PX: INTRAVASCULAR PRESSURE WIRE/FFR STUDY: CATH118243

## 2019-06-02 LAB — POCT ACTIVATED CLOTTING TIME
Activated Clotting Time: 340 seconds
Activated Clotting Time: 268 seconds

## 2019-06-02 SURGERY — LEFT HEART CATH AND CORONARY ANGIOGRAPHY
Anesthesia: LOCAL

## 2019-06-02 MED ORDER — FENTANYL CITRATE (PF) 100 MCG/2ML IJ SOLN
INTRAMUSCULAR | Status: AC
  Filled 2019-06-02: qty 2

## 2019-06-02 MED ORDER — SODIUM CHLORIDE 0.9 % WEIGHT BASED INFUSION: 1.0000 mL/kg/h | INTRAVENOUS | Status: DC

## 2019-06-02 MED ORDER — FENTANYL CITRATE (PF) 100 MCG/2ML IJ SOLN
INTRAMUSCULAR | Status: DC | PRN
  Administered 2019-06-02: 25 ug via INTRAVENOUS

## 2019-06-02 MED ORDER — ASPIRIN 81 MG PO CHEW: 81.0000 mg | CHEWABLE_TABLET | ORAL | Status: DC

## 2019-06-02 MED ORDER — HEPARIN (PORCINE) IN NACL 1000-0.9 UT/500ML-% IV SOLN
INTRAVENOUS | Status: AC
  Filled 2019-06-02: qty 1000

## 2019-06-02 MED ORDER — MIDAZOLAM HCL 2 MG/2ML IJ SOLN
INTRAMUSCULAR | Status: DC | PRN
  Administered 2019-06-02: 2 mg via INTRAVENOUS

## 2019-06-02 MED ORDER — HYDRALAZINE HCL 20 MG/ML IJ SOLN: 10.0000 mg | INTRAMUSCULAR | Status: DC | PRN

## 2019-06-02 MED ORDER — HEPARIN (PORCINE) IN NACL 1000-0.9 UT/500ML-% IV SOLN
INTRAVENOUS | Status: DC | PRN
  Administered 2019-06-02 (×2): 500 mL

## 2019-06-02 MED ORDER — VERAPAMIL HCL 2.5 MG/ML IV SOLN
INTRAVENOUS | Status: AC
  Filled 2019-06-02: qty 2

## 2019-06-02 MED ORDER — LIDOCAINE HCL (PF) 1 % IJ SOLN
INTRAMUSCULAR | Status: DC | PRN
  Administered 2019-06-02: 2 mL

## 2019-06-02 MED ORDER — LIDOCAINE HCL (PF) 1 % IJ SOLN
INTRAMUSCULAR | Status: AC
  Filled 2019-06-02: qty 30

## 2019-06-02 MED ORDER — ONDANSETRON HCL 4 MG/2ML IJ SOLN: 4.0000 mg | Freq: Four times a day (QID) | INTRAMUSCULAR | Status: DC | PRN

## 2019-06-02 MED ORDER — SODIUM CHLORIDE 0.9% FLUSH: 3.0000 mL | INTRAVENOUS | Status: DC | PRN

## 2019-06-02 MED ORDER — SODIUM CHLORIDE 0.9 % IV SOLN: INTRAVENOUS | Status: AC

## 2019-06-02 MED ORDER — SODIUM CHLORIDE 0.9 % WEIGHT BASED INFUSION
3.0000 mL/kg/h | INTRAVENOUS | Status: AC
  Administered 2019-06-02: 3 mL/kg/h via INTRAVENOUS

## 2019-06-02 MED ORDER — LABETALOL HCL 5 MG/ML IV SOLN: 10.0000 mg | INTRAVENOUS | Status: DC | PRN

## 2019-06-02 MED ORDER — ACETAMINOPHEN 325 MG PO TABS: 650.0000 mg | ORAL_TABLET | ORAL | Status: DC | PRN

## 2019-06-02 MED ORDER — VERAPAMIL HCL 2.5 MG/ML IV SOLN
INTRAVENOUS | Status: DC | PRN
  Administered 2019-06-02: 10 mL via INTRA_ARTERIAL

## 2019-06-02 MED ORDER — HEPARIN SODIUM (PORCINE) 1000 UNIT/ML IJ SOLN
INTRAMUSCULAR | Status: DC | PRN
  Administered 2019-06-02 (×2): 5000 [IU] via INTRAVENOUS

## 2019-06-02 MED ORDER — SODIUM CHLORIDE 0.9% FLUSH: 3.0000 mL | Freq: Two times a day (BID) | INTRAVENOUS | Status: DC

## 2019-06-02 MED ORDER — HEPARIN SODIUM (PORCINE) 1000 UNIT/ML IJ SOLN
INTRAMUSCULAR | Status: AC
  Filled 2019-06-02: qty 1

## 2019-06-02 MED ORDER — IOHEXOL 350 MG/ML SOLN
INTRAVENOUS | Status: AC
  Filled 2019-06-02: qty 1

## 2019-06-02 MED ORDER — SODIUM CHLORIDE 0.9 % IV SOLN: 250.0000 mL | INTRAVENOUS | Status: DC | PRN

## 2019-06-02 MED ORDER — MIDAZOLAM HCL 2 MG/2ML IJ SOLN
INTRAMUSCULAR | Status: AC
  Filled 2019-06-02: qty 2

## 2019-06-02 MED ORDER — IOHEXOL 350 MG/ML SOLN
INTRAVENOUS | Status: DC | PRN
  Administered 2019-06-02: 120 mL

## 2019-06-02 SURGICAL SUPPLY — 14 items
CATH LAUNCHER 6FR JR4 (CATHETERS) ×1 IMPLANT
CATH NAVVUS II (CATHETERS) ×1 IMPLANT
CATH OPTITORQUE TIG 4.0 5F (CATHETERS) ×1 IMPLANT
CATH VISTA GUIDE 6FR XB3.5 (CATHETERS) ×1 IMPLANT
GLIDESHEATH SLEND SS 6F .021 (SHEATH) ×1 IMPLANT
GUIDEWIRE INQWIRE 1.5J.035X260 (WIRE) IMPLANT
INQWIRE 1.5J .035X260CM (WIRE) ×2
KIT ENCORE 26 ADVANTAGE (KITS) ×1 IMPLANT
KIT HEART LEFT (KITS) ×2 IMPLANT
PACK CARDIAC CATHETERIZATION (CUSTOM PROCEDURE TRAY) ×2 IMPLANT
SHEATH PROBE COVER 6X72 (BAG) ×1 IMPLANT
TRANSDUCER W/STOPCOCK (MISCELLANEOUS) ×2 IMPLANT
TUBING CIL FLEX 10 FLL-RA (TUBING) ×2 IMPLANT
WIRE ASAHI PROWATER 180CM (WIRE) ×1 IMPLANT

## 2019-06-02 NOTE — Progress Notes (Signed)
   Cath: 06/02/19   Coronary angiography:  Prox RCA lesion is 60% stenosed. Mid RCA-1 lesion is 85% stenosed. Mid RCA-2 lesion is 70% stenosed.  Previously placed Mid RCA to Dist RCA drug-eluting stent is widely patent.  Mid LAD lesion is 60% stenosed -DFR 0.96 (nonsignificant)  Previously placed Dist LAD drug-eluting stent is widely patent.  1st Diag lesion is 65% stenosed (prior to bifurcation) -DFR 0.95 (nonsignificant)  Mid Cx lesion is 60% stenosed (napkin ring lesion).-DFR 0.96 (nonsignificant)  --Hemodynamics  The left ventricular systolic function is normal. The left ventricular ejection fraction is 55-65% by visual estimate.  LV end diastolic pressure is moderately elevated.   SUMMARY  Severe RIGHT CORONARY ARTERY disease with tandem 80 and 70% lesions, heavily calcified as likely culprit (prior to previous stent)  Moderate (DFR negative) proximal D1, mid LAD and mid LCx lesions with otherwise widely patent distal LAD stent.  Normal LVEF and EDP.  RECOMMENDATIONS  With significant contrast load during the diagnostic catheterization, plan will be to discharge home today and schedule staged orbital atherectomy based PCI of the RCA on Friday, March 5.  (Third case) ? Of note, the patient will need to quarantine between now on that day in order to keep his current COVID-19 status valid.  Continue other home medicines.  [name], MD  Per notes above, the patient has been instructed to quarantine at home until cath which is scheduled for Friday, March 5th so he does not have to have repeat COVID swab.   Janice Coffin, NP-C 06/02/2019, 4:41 PM Pager: [phone]

## 2019-06-02 NOTE — Interval H&P Note (Signed)
History and Physical Interval Note:  06/02/2019 12:53 PM  [name]  has presented today for surgery, with the diagnosis of Class 3 angina, Abnormal stress test.  The various methods of treatment have been discussed with the patient and family. After consideration of risks, benefits and other options for treatment, the patient has consented to  Procedure(s): LEFT HEART CATH AND CORONARY ANGIOGRAPHY (N/A)  PERCUTANEOUS CORONARY INTERVENTION   as a surgical intervention.  The patient's history has been reviewed, patient examined, no change in status, stable for surgery.  I have reviewed the patient's chart and labs.  Questions were answered to the patient's satisfaction.    Cath Lab Visit (complete for each Cath Lab visit)  Clinical Evaluation Leading to the Procedure:   ACS: No.  Non-ACS:    Anginal Classification: CCS III  Anti-ischemic medical therapy: Minimal Therapy (1 class of medications)  Non-Invasive Test Results: Intermediate-risk stress test findings: cardiac mortality 1-3%/year  Prior CABG: No previous CABG   [name]

## 2019-06-02 NOTE — Discharge Instructions (Signed)
The patient needs to stay quarantined between now and Friday morning for planned staged PCI on Friday morning at 1030 case.  Radial Site Care  This sheet gives you information about how to care for yourself after your procedure. Your health care provider may also give you more specific instructions. If you have problems or questions, contact your health care provider. What can I expect after the procedure? After the procedure, it is common to have:  Bruising and tenderness at the catheter insertion area. Follow these instructions at home: Medicines  Take over-the-counter and prescription medicines only as told by your health care provider. Insertion site care  Follow instructions from your health care provider about how to take care of your insertion site. Make sure you: ? Wash your hands with soap and water before you change your bandage (dressing). If soap and water are not available, use hand sanitizer. ? Change your dressing as told by your health care provider. ? Leave stitches (sutures), skin glue, or adhesive strips in place. These skin closures may need to stay in place for 2 weeks or longer. If adhesive strip edges start to loosen and curl up, you may trim the loose edges. Do not remove adhesive strips completely unless your health care provider tells you to do that.  Check your insertion site every day for signs of infection. Check for: ? Redness, swelling, or pain. ? Fluid or blood. ? Pus or a bad smell. ? Warmth.  Do not take baths, swim, or use a hot tub until your health care provider approves.  You may shower 24-48 hours after the procedure, or as directed by your health care provider. ? Remove the dressing and gently wash the site with plain soap and water. ? Pat the area dry with a clean towel. ? Do not rub the site. That could cause bleeding.  Do not apply powder or lotion to the site. Activity   For 24 hours after the procedure, or as directed by your health  care provider: ? Do not flex or bend the affected arm. ? Do not push or pull heavy objects with the affected arm. ? Do not drive yourself home from the hospital or clinic. You may drive 24 hours after the procedure unless your health care provider tells you not to. ? Do not operate machinery or power tools.  Do not lift anything that is heavier than 10 lb (4.5 kg), or the limit that you are told, until your health care provider says that it is safe.  Ask your health care provider when it is okay to: ? Return to work or school. ? Resume usual physical activities or sports. ? Resume sexual activity. General instructions  If the catheter site starts to bleed, raise your arm and put firm pressure on the site. If the bleeding does not stop, get help right away. This is a medical emergency.  If you went home on the same day as your procedure, a responsible adult should be with you for the first 24 hours after you arrive home.  Keep all follow-up visits as told by your health care provider. This is important. Contact a health care provider if:  You have a fever.  You have redness, swelling, or yellow drainage around your insertion site. Get help right away if:  You have unusual pain at the radial site.  The catheter insertion area swells very fast.  The insertion area is bleeding, and the bleeding does not stop when you hold steady  pressure on the area.  Your arm or hand becomes pale, cool, tingly, or numb. These symptoms may represent a serious problem that is an emergency. Do not wait to see if the symptoms will go away. Get medical help right away. Call your local emergency services (911 in the [place] Do not drive yourself to the hospital. Summary  After the procedure, it is common to have bruising and tenderness at the site.  Follow instructions from your health care provider about how to take care of your radial site wound. Check the wound every day for signs of infection.  Do  not lift anything that is heavier than 10 lb (4.5 kg), or the limit that you are told, until your health care provider says that it is safe. This information is not intended to replace advice given to you by your health care provider. Make sure you discuss any questions you have with your health care provider. Document Revised: 04/24/2017 Document Reviewed: 04/24/2017 Elsevier Patient Education  2020 [employer].

## 2019-06-04 ENCOUNTER — Telehealth: Payer: Self-pay | Admitting: *Deleted

## 2019-06-04 NOTE — Telephone Encounter (Signed)
Pt contacted pre-catheterization scheduled at [facility] for: Friday June 05, 2019 10:30 AM Verified arrival time and place: [facility] Main Entrance A [facility]) at: 8 AM-needs lab BMP/CBC   No solid food after midnight prior to cath, clear liquids until 5 AM day of procedure. Contrast allergy: no   AM meds can be  taken pre-cath with sip of water including: ASA 81 mg Plavix 75 mg   Confirmed patient has responsible adult to drive home post procedure and observe 24 hours after arriving home: yes  Currently, due to Covid-19 pandemic, only one person will be allowed with patient. Must be the same person for patient's entire stay and will be required to wear a mask. They will be asked to wait in the waiting room for the duration of the patient's stay.  Patients are required to wear a mask when they enter the hospital.      COVID-19 Pre-Screening Questions:  . In the past 7 to 10 days have you had a cough,  shortness of breath, headache, congestion, fever (100 or greater) body aches, chills, sore throat, or sudden loss of taste or sense of smell? no . Have you been around anyone with known Covid 19 in the past 7-10 days? no . Have you been around anyone who is awaiting Covid 19 test results in the past 7 to 10 days? no . Have you been around anyone who has been exposed to Covid 19, or has mentioned symptoms of Covid 19 within the past 7 to 10 days? No  I reviewed procedure/mask/visitor instructions, COVID-19 screening questions with patient, he verbalized understanding, thanked me for call. Pt states he has been in quarantine since discharge from hospital 06/02/19, knows to continue to quarantine until procedure tomorrow.

## 2019-06-05 ENCOUNTER — Other Ambulatory Visit: Payer: Self-pay

## 2019-06-05 ENCOUNTER — Ambulatory Visit (HOSPITAL_COMMUNITY)
Admission: RE | Disposition: A | Discharge status: Home / Self Care | Payer: Self-pay | Source: Home / Self Care | Attending: Cardiology

## 2019-06-05 ENCOUNTER — Ambulatory Visit (HOSPITAL_COMMUNITY)
Admission: RE | Admit: 2019-06-05 | Discharge: 2019-06-06 | Disposition: A | Discharge status: Home / Self Care | Payer: Federal, State, Local not specified - PPO | Attending: Cardiology | Admitting: Cardiology

## 2019-06-05 DIAGNOSIS — I10 Essential (primary) hypertension: Secondary | ICD-10-CM | POA: Diagnosis not present

## 2019-06-05 DIAGNOSIS — K219 Gastro-esophageal reflux disease without esophagitis: Secondary | ICD-10-CM | POA: Diagnosis not present

## 2019-06-05 DIAGNOSIS — Z7902 Long term (current) use of antithrombotics/antiplatelets: Secondary | ICD-10-CM | POA: Diagnosis not present

## 2019-06-05 DIAGNOSIS — M19041 Primary osteoarthritis, right hand: Secondary | ICD-10-CM | POA: Diagnosis not present

## 2019-06-05 DIAGNOSIS — I2584 Coronary atherosclerosis due to calcified coronary lesion: Secondary | ICD-10-CM | POA: Insufficient documentation

## 2019-06-05 DIAGNOSIS — M19042 Primary osteoarthritis, left hand: Secondary | ICD-10-CM | POA: Insufficient documentation

## 2019-06-05 DIAGNOSIS — E785 Hyperlipidemia, unspecified: Secondary | ICD-10-CM | POA: Diagnosis present

## 2019-06-05 DIAGNOSIS — F419 Anxiety disorder, unspecified: Secondary | ICD-10-CM | POA: Diagnosis present

## 2019-06-05 DIAGNOSIS — I252 Old myocardial infarction: Secondary | ICD-10-CM | POA: Diagnosis not present

## 2019-06-05 DIAGNOSIS — Z79899 Other long term (current) drug therapy: Secondary | ICD-10-CM | POA: Insufficient documentation

## 2019-06-05 DIAGNOSIS — M069 Rheumatoid arthritis, unspecified: Secondary | ICD-10-CM | POA: Diagnosis not present

## 2019-06-05 DIAGNOSIS — Z7982 Long term (current) use of aspirin: Secondary | ICD-10-CM | POA: Diagnosis not present

## 2019-06-05 DIAGNOSIS — I25119 Atherosclerotic heart disease of native coronary artery with unspecified angina pectoris: Secondary | ICD-10-CM | POA: Diagnosis not present

## 2019-06-05 DIAGNOSIS — Z8249 Family history of ischemic heart disease and other diseases of the circulatory system: Secondary | ICD-10-CM | POA: Insufficient documentation

## 2019-06-05 DIAGNOSIS — I959 Hypotension, unspecified: Secondary | ICD-10-CM | POA: Diagnosis not present

## 2019-06-05 DIAGNOSIS — I2089 Other forms of angina pectoris: Secondary | ICD-10-CM | POA: Diagnosis present

## 2019-06-05 DIAGNOSIS — N400 Enlarged prostate without lower urinary tract symptoms: Secondary | ICD-10-CM | POA: Diagnosis present

## 2019-06-05 DIAGNOSIS — R9439 Abnormal result of other cardiovascular function study: Secondary | ICD-10-CM | POA: Diagnosis not present

## 2019-06-05 DIAGNOSIS — I2510 Atherosclerotic heart disease of native coronary artery without angina pectoris: Secondary | ICD-10-CM

## 2019-06-05 DIAGNOSIS — Z955 Presence of coronary angioplasty implant and graft: Secondary | ICD-10-CM | POA: Diagnosis not present

## 2019-06-05 DIAGNOSIS — I209 Angina pectoris, unspecified: Secondary | ICD-10-CM | POA: Diagnosis present

## 2019-06-05 DIAGNOSIS — I208 Other forms of angina pectoris: Secondary | ICD-10-CM | POA: Diagnosis present

## 2019-06-05 HISTORY — PX: CORONARY STENT INTERVENTION: CATH118234

## 2019-06-05 HISTORY — PX: CORONARY ATHERECTOMY: CATH118238

## 2019-06-05 HISTORY — PX: TEMPORARY PACEMAKER: CATH118268

## 2019-06-05 LAB — CBC
HCT: 44.1 % (ref 39.0–52.0)
Hemoglobin: 15.6 g/dL (ref 13.0–17.0)
MCH: 30.6 pg (ref 26.0–34.0)
MCHC: 35.4 g/dL (ref 30.0–36.0)
MCV: 86.6 fL (ref 80.0–100.0)
Platelets: 157 10*3/uL (ref 150–400)
RBC: 5.09 MIL/uL (ref 4.22–5.81)
RDW: 12.5 % (ref 11.5–15.5)
WBC: 5.5 10*3/uL (ref 4.0–10.5)
nRBC: 0.4 % — ABNORMAL HIGH (ref 0.0–0.2)

## 2019-06-05 LAB — BASIC METABOLIC PANEL
Anion gap: 9 (ref 5–15)
BUN: 21 mg/dL (ref 8–23)
CO2: 26 mmol/L (ref 22–32)
Calcium: 9.7 mg/dL (ref 8.9–10.3)
Chloride: 105 mmol/L (ref 98–111)
Creatinine, Ser: 0.91 mg/dL (ref 0.61–1.24)
GFR calc Af Amer: >60 mL/min (ref >60)
GFR calc non Af Amer: >60 mL/min (ref >60)
Glucose, Bld: 108 mg/dL — ABNORMAL HIGH (ref 70–99)
Potassium: 3.9 mmol/L (ref 3.5–5.1)
Sodium: 140 mmol/L (ref 135–145)

## 2019-06-05 LAB — POCT ACTIVATED CLOTTING TIME
Activated Clotting Time: 208 seconds
Activated Clotting Time: 296 seconds
Activated Clotting Time: 263 seconds
Activated Clotting Time: 312 seconds
Activated Clotting Time: 224 seconds
Activated Clotting Time: 186 seconds

## 2019-06-05 SURGERY — CORONARY ATHERECTOMY
Anesthesia: LOCAL

## 2019-06-05 MED ORDER — HEPARIN (PORCINE) IN NACL 1000-0.9 UT/500ML-% IV SOLN
INTRAVENOUS | Status: AC
  Filled 2019-06-05: qty 1000

## 2019-06-05 MED ORDER — PANTOPRAZOLE SODIUM 40 MG PO TBEC
40.0000 mg | DELAYED_RELEASE_TABLET | Freq: Every day | ORAL | Status: DC
  Administered 2019-06-06: 40 mg via ORAL
  Filled 2019-06-05 (×2): qty 1

## 2019-06-05 MED ORDER — FAMOTIDINE 20 MG PO CHEW: 20.0000 mg | CHEWABLE_TABLET | Freq: Every day | ORAL | Status: DC | PRN

## 2019-06-05 MED ORDER — SODIUM CHLORIDE 0.9 % IV SOLN: INTRAVENOUS | Status: AC

## 2019-06-05 MED ORDER — AMLODIPINE BESYLATE 10 MG PO TABS: 10.0000 mg | ORAL_TABLET | Freq: Every evening | ORAL | Status: DC

## 2019-06-05 MED ORDER — SODIUM CHLORIDE 0.9 % WEIGHT BASED INFUSION: 3.0000 mL/kg/h | INTRAVENOUS | Status: DC

## 2019-06-05 MED ORDER — SODIUM CHLORIDE 0.9 % IV SOLN: 250.0000 mL | INTRAVENOUS | Status: DC | PRN

## 2019-06-05 MED ORDER — TURMERIC CURCUMIN PO CAPS: 1.0000 | ORAL_CAPSULE | Freq: Every day | ORAL | Status: DC

## 2019-06-05 MED ORDER — LABETALOL HCL 5 MG/ML IV SOLN: 10.0000 mg | INTRAVENOUS | Status: AC | PRN

## 2019-06-05 MED ORDER — MIDAZOLAM HCL 2 MG/2ML IJ SOLN
INTRAMUSCULAR | Status: AC
  Filled 2019-06-05: qty 2

## 2019-06-05 MED ORDER — FENTANYL CITRATE (PF) 100 MCG/2ML IJ SOLN
INTRAMUSCULAR | Status: AC
  Filled 2019-06-05: qty 2

## 2019-06-05 MED ORDER — ACETAMINOPHEN 325 MG PO TABS: 650.0000 mg | ORAL_TABLET | ORAL | Status: DC | PRN

## 2019-06-05 MED ORDER — CLOPIDOGREL BISULFATE 75 MG PO TABS: 75.0000 mg | ORAL_TABLET | ORAL | Status: DC

## 2019-06-05 MED ORDER — ACETAMINOPHEN 500 MG PO TABS: 500.0000 mg | ORAL_TABLET | Freq: Four times a day (QID) | ORAL | Status: DC | PRN

## 2019-06-05 MED ORDER — MIDAZOLAM HCL 2 MG/2ML IJ SOLN
INTRAMUSCULAR | Status: DC | PRN
  Administered 2019-06-05: 2 mg via INTRAVENOUS
  Administered 2019-06-05: 1 mg via INTRAVENOUS

## 2019-06-05 MED ORDER — SODIUM CHLORIDE 0.9% FLUSH: 3.0000 mL | INTRAVENOUS | Status: DC | PRN

## 2019-06-05 MED ORDER — HEPARIN (PORCINE) IN NACL 1000-0.9 UT/500ML-% IV SOLN
INTRAVENOUS | Status: DC | PRN
  Administered 2019-06-05 (×3): 500 mL

## 2019-06-05 MED ORDER — ROSUVASTATIN CALCIUM 20 MG PO TABS
20.0000 mg | ORAL_TABLET | Freq: Every day | ORAL | Status: DC
  Administered 2019-06-05: 20 mg via ORAL
  Filled 2019-06-05 (×2): qty 1

## 2019-06-05 MED ORDER — FENTANYL CITRATE (PF) 100 MCG/2ML IJ SOLN
INTRAMUSCULAR | Status: DC | PRN
  Administered 2019-06-05: 50 ug via INTRAVENOUS
  Administered 2019-06-05: 25 ug via INTRAVENOUS

## 2019-06-05 MED ORDER — HEPARIN SODIUM (PORCINE) 1000 UNIT/ML IJ SOLN
INTRAMUSCULAR | Status: DC | PRN
  Administered 2019-06-05: 2000 [IU] via INTRAVENOUS
  Administered 2019-06-05: 4000 [IU] via INTRAVENOUS
  Administered 2019-06-05: 6000 [IU] via INTRAVENOUS
  Administered 2019-06-05: 2000 [IU] via INTRAVENOUS

## 2019-06-05 MED ORDER — NITROGLYCERIN 1 MG/10 ML FOR IR/CATH LAB
INTRA_ARTERIAL | Status: DC | PRN
  Administered 2019-06-05 (×2): 200 ug via INTRACORONARY

## 2019-06-05 MED ORDER — ASPIRIN EC 81 MG PO TBEC
81.0000 mg | DELAYED_RELEASE_TABLET | Freq: Every day | ORAL | Status: DC
  Administered 2019-06-06: 81 mg via ORAL
  Filled 2019-06-05 (×2): qty 1

## 2019-06-05 MED ORDER — HYDRALAZINE HCL 20 MG/ML IJ SOLN: 10.0000 mg | INTRAMUSCULAR | Status: AC | PRN

## 2019-06-05 MED ORDER — SODIUM CHLORIDE 0.9 % IV SOLN
INTRAVENOUS | Status: AC | PRN
  Administered 2019-06-05: 250 mL via INTRAVENOUS
  Administered 2019-06-05: 500 mL

## 2019-06-05 MED ORDER — ASPIRIN 81 MG PO CHEW: 81.0000 mg | CHEWABLE_TABLET | ORAL | Status: DC

## 2019-06-05 MED ORDER — IBUPROFEN 400 MG PO TABS: 400.0000 mg | ORAL_TABLET | Freq: Three times a day (TID) | ORAL | Status: DC | PRN

## 2019-06-05 MED ORDER — IOHEXOL 350 MG/ML SOLN
INTRAVENOUS | Status: DC | PRN
  Administered 2019-06-05: 140 mL via INTRA_ARTERIAL

## 2019-06-05 MED ORDER — HEPARIN SODIUM (PORCINE) 1000 UNIT/ML IJ SOLN
INTRAMUSCULAR | Status: AC
  Filled 2019-06-05: qty 1

## 2019-06-05 MED ORDER — LOSARTAN POTASSIUM 25 MG PO TABS
25.0000 mg | ORAL_TABLET | Freq: Every day | ORAL | Status: DC
  Administered 2019-06-06: 25 mg via ORAL
  Filled 2019-06-05: qty 1

## 2019-06-05 MED ORDER — ONDANSETRON HCL 4 MG/2ML IJ SOLN
INTRAMUSCULAR | Status: AC
  Filled 2019-06-05: qty 2

## 2019-06-05 MED ORDER — COQ10 100 MG PO CAPS: 100.0000 mg | ORAL_CAPSULE | Freq: Every day | ORAL | Status: DC

## 2019-06-05 MED ORDER — VIPERSLIDE LUBRICANT OPTIME: TOPICAL | Status: DC | PRN

## 2019-06-05 MED ORDER — ONDANSETRON HCL 4 MG/2ML IJ SOLN
4.0000 mg | Freq: Four times a day (QID) | INTRAMUSCULAR | Status: DC | PRN
  Administered 2019-06-05: 4 mg via INTRAVENOUS

## 2019-06-05 MED ORDER — LIDOCAINE HCL (PF) 1 % IJ SOLN
INTRAMUSCULAR | Status: DC | PRN
  Administered 2019-06-05: 20 mL

## 2019-06-05 MED ORDER — FINASTERIDE 5 MG PO TABS
5.0000 mg | ORAL_TABLET | Freq: Every day | ORAL | Status: DC
  Administered 2019-06-06: 5 mg via ORAL
  Filled 2019-06-05: qty 1

## 2019-06-05 MED ORDER — FLUTICASONE PROPIONATE 50 MCG/ACT NA SUSP: 1.0000 | Freq: Every day | NASAL | Status: DC | PRN

## 2019-06-05 MED ORDER — NITROGLYCERIN 1 MG/10 ML FOR IR/CATH LAB
INTRA_ARTERIAL | Status: AC
  Filled 2019-06-05: qty 10

## 2019-06-05 MED ORDER — MELATONIN 3 MG PO TABS
9.0000 mg | ORAL_TABLET | Freq: Every evening | ORAL | Status: DC | PRN
  Filled 2019-06-05: qty 3

## 2019-06-05 MED ORDER — NOREPINEPHRINE 4 MG/250ML-% IV SOLN
INTRAVENOUS | Status: AC
  Filled 2019-06-05: qty 250

## 2019-06-05 MED ORDER — RANOLAZINE ER 500 MG PO TB12
500.0000 mg | ORAL_TABLET | Freq: Two times a day (BID) | ORAL | Status: DC
  Administered 2019-06-05 – 2019-06-06 (×2): 500 mg via ORAL
  Filled 2019-06-05 (×2): qty 1

## 2019-06-05 MED ORDER — SODIUM CHLORIDE 0.9% FLUSH
3.0000 mL | Freq: Two times a day (BID) | INTRAVENOUS | Status: DC
  Administered 2019-06-05: 3 mL via INTRAVENOUS

## 2019-06-05 MED ORDER — CLOPIDOGREL BISULFATE 300 MG PO TABS
ORAL_TABLET | ORAL | Status: DC | PRN
  Administered 2019-06-05: 175 mg via ORAL

## 2019-06-05 MED ORDER — CLOPIDOGREL BISULFATE 75 MG PO TABS
ORAL_TABLET | ORAL | Status: AC
  Filled 2019-06-05: qty 2

## 2019-06-05 MED ORDER — SODIUM CHLORIDE 0.9 % WEIGHT BASED INFUSION: 1.0000 mL/kg/h | INTRAVENOUS | Status: DC

## 2019-06-05 MED ORDER — NITROGLYCERIN 0.4 MG SL SUBL: 0.4000 mg | SUBLINGUAL_TABLET | SUBLINGUAL | Status: DC | PRN

## 2019-06-05 MED ORDER — NOREPINEPHRINE BITARTRATE 1 MG/ML IV SOLN
INTRAVENOUS | Status: DC | PRN
  Administered 2019-06-05: 10 ug/min via INTRAVENOUS

## 2019-06-05 MED ORDER — POTASSIUM GLUCONATE 595 (99 K) MG PO TABS: 595.0000 mg | ORAL_TABLET | Freq: Every day | ORAL | Status: DC

## 2019-06-05 MED ORDER — CLOPIDOGREL BISULFATE 75 MG PO TABS
75.0000 mg | ORAL_TABLET | Freq: Every day | ORAL | Status: DC
  Administered 2019-06-06: 75 mg via ORAL
  Filled 2019-06-05 (×2): qty 1

## 2019-06-05 MED ORDER — IOHEXOL 350 MG/ML SOLN
INTRAVENOUS | Status: AC
  Filled 2019-06-05: qty 1

## 2019-06-05 MED ORDER — AZELASTINE HCL 0.1 % NA SOLN: 1.0000 | Freq: Two times a day (BID) | NASAL | Status: DC | PRN

## 2019-06-05 MED ORDER — SODIUM CHLORIDE 0.9% FLUSH: 3.0000 mL | Freq: Two times a day (BID) | INTRAVENOUS | Status: DC

## 2019-06-05 SURGICAL SUPPLY — 29 items
BALLN SAPPHIRE 3.0X10 (BALLOONS) ×2
BALLN SAPPHIRE 3.0X20 (BALLOONS) ×2
BALLN SAPPHIRE ~~LOC~~ 3.25X10 (BALLOONS) ×2 IMPLANT
BALLN SAPPHIRE ~~LOC~~ 3.5X12 (BALLOONS) ×2 IMPLANT
BALLN SAPPHIRE ~~LOC~~ 4.0X18 (BALLOONS) ×2 IMPLANT
BALLN WOLVERINE 3.00X10 (BALLOONS) ×2
BALLOON SAPPHIRE 3.0X10 (BALLOONS) ×1 IMPLANT
BALLOON SAPPHIRE 3.0X20 (BALLOONS) ×1 IMPLANT
BALLOON WOLVERINE 3.00X10 (BALLOONS) ×1 IMPLANT
CABLE ADAPT CONN TEMP 6FT (ADAPTER) ×2 IMPLANT
CATH S G BIP PACING (CATHETERS) ×2 IMPLANT
CATH TELEPORT (CATHETERS) ×2 IMPLANT
CATH TELESCOPE 6F GEC (CATHETERS) ×2 IMPLANT
CATH VISTA GUIDE 6FR JR4 (CATHETERS) ×2 IMPLANT
CROWN DIAMONDBACK CLASSIC 1.25 (BURR) ×2 IMPLANT
ELECT DEFIB PAD ADLT CADENCE (PAD) ×2 IMPLANT
KIT ENCORE 26 ADVANTAGE (KITS) ×2 IMPLANT
KIT HEART LEFT (KITS) ×2 IMPLANT
LUBRICANT VIPERSLIDE CORONARY (MISCELLANEOUS) ×2 IMPLANT
PACK CARDIAC CATHETERIZATION (CUSTOM PROCEDURE TRAY) ×2 IMPLANT
SHEATH PINNACLE 6F 10CM (SHEATH) ×4 IMPLANT
STENT RESOLUTE ONYX 3.0X8 (Permanent Stent) ×2 IMPLANT
STENT RESOLUTE ONYX 3.5X38 (Permanent Stent) ×2 IMPLANT
TRANSDUCER W/STOPCOCK (MISCELLANEOUS) ×2 IMPLANT
TUBING CIL FLEX 10 FLL-RA (TUBING) ×2 IMPLANT
WIRE ASAHI PROWATER 300CM (WIRE) ×2 IMPLANT
WIRE EMERALD 3MM-J .035X150CM (WIRE) ×2 IMPLANT
WIRE MAILMAN 182CM (WIRE) ×4 IMPLANT
WIRE VIPERWIRE COR FLEX .012 (WIRE) ×2 IMPLANT

## 2019-06-05 NOTE — H&P (Signed)
Brief History and Physical Note:  NAME:  [name]   MRN: [id number] DOB:  1955/07/01   ADMIT DATE: 06/05/2019   06/05/2019 10:55 AM  Linard Millers Hargadon is a 64 y.o. male with past medical history of inferior STEMI-RCA PCI back in March 2013 followed by unstable angina in July 2015 with stents placed to the distal LAD.  He has been having progressively worsening exertional dyspnea and chest tightness/angina over the last 6 months.  6 months ago he did have a nuclear stress test suggesting inferolateral ischemia.  We attempted medical management that has now become unsuccessful.  He was referred for diagnostic cardiac catheterization on June 02, 2019 which revealed heavily calcified mid RCA stenoses of 60, 85 and 70% proximal to the previously placed stent.  We did DFR measurement on lesions in a diagonal branch, mid circumflex and ramus intermedius that were all hemodynamically not significant.  After completing all these procedures, there was concern for extent of contrast and radiation, therefore we decided to stage atherectomy based PCI of the RCA for another day.  HPI: Wilmer was discharged home after his cardiac catheterization.  He is staying quarantined at home due to [employer].  He has not really been all that active trying to avoid having angina.  Has not had any progressively worsening angina since discharge.  No issues with his radial access site.  He denies any rapid regular heartbeats palpitations.  No syncope/near syncope or TIA/amaurosis fugax.  No PND, orthopnea or edema.  Past Medical History:  Diagnosis Date  . Anxiety   . Arthritis    "mostly in my hands; sometimes other areas" (10/20/2013)  . Bipolar disorder (HCC)   . BPH (benign prostatic hyperplasia)   . CAD S/P percutaneous coronary angioplasty 06/2011, 09/2013   (Therapeutic P2Y12 on Plavix);; a) STEMI -mid RCA DES PCI; 2015: Class III angina (abnormal MYOVIEW w/ Ant-AntLat ischemia -> mid-distal LAD 95-99% (PCI:  Biotronik AG 2 DES 2.5 x 13, 2.75 mm), D1 proximal 50-60% (FFR 0.86) with 60-70% in small branch (med management), mid circumflex 50-60% (FFR 1.0)   . Depression   . Dizziness of unknown cause January 2011   abn MRI, Neuro w/u Jan 2011  . Dyslipidemia, goal LDL below 70   . Essential hypertension   . GERD (gastroesophageal reflux disease)   . RA (rheumatoid arthritis) (HCC)   . Rheumatic fever 1969  . ST elevation myocardial infarction (STEMI) involving right coronary artery in recovery phase (HCC) 06/2011   100% occluded RCA; PCI - 3.0x24 Promus DES (3.5-3.6 mm) ; Modwith moderate LAD & Cx disease, EF 55-60% w/ basal inferoseptal HK confirmed by echo   Past Surgical History:  Procedure Laterality Date  . INTRAVASCULAR PRESSURE WIRE/FFR STUDY N/A 06/02/2019   Procedure: INTRAVASCULAR PRESSURE WIRE/FFR STUDY;  Surgeon: Marykay Lex, MD;  Location: [facility] INVASIVE CV LAB;  Service: Cardiovascular;  Laterality: N/A;  . LEFT HEART CATH AND CORONARY ANGIOGRAPHY N/A 06/02/2019   Procedure: LEFT HEART CATH AND CORONARY ANGIOGRAPHY;  Surgeon: Marykay Lex, MD;  Location: [facility] INVASIVE CV LAB;  Service: Cardiovascular;  Laterality: N/A;  . LEFT HEART CATHETERIZATION WITH CORONARY ANGIOGRAM N/A 06/28/2011   Procedure: LEFT HEART CATHETERIZATION WITH CORONARY ANGIOGRAM;  Surgeon: Marykay Lex, MD;  Location: [facility] CATH LAB;  50% LAD after D1, D1 30%.  Small D2.  Large caliber LCx with (small OM1) major OM 2 and 3 followed by ABG circumflex with 40% focal lesion terminating with LPL 1.  100% mid RCA (DES PCI); beyond 100% RCA  - large PDA (double barrel) and small PAV-PL -EF 55 to 60% w/ midInf-LAf HK.  Marland Kitchen LEFT HEART CATHETERIZATION WITH CORONARY ANGIOGRAM N/A 10/20/2013   Procedure: LEFT HEART CATHETERIZATION WITH CORONARY ANGIOGRAM;  Surgeon: Leonie Man, MD;  Location: [facility] CATH LAB;  Service: Cardiovascular;  Laterality: N/A;  . NM MYOVIEW LTD N/A 10/19/2013   INTERMEDIATE RISK, septal,  anterior-anterolateral ischemia --> referred for cath the following day -- LAD LESION TREATED WITH DES PCI  . PERCUTANEOUS CORONARY STENT INTERVENTION (PCI-S) N/A 06/2011; 09/2013   a) 3/'13: 100%  RCA ->PCI 3.20x2 (3.6) Promus DES stent; 7/'15: mLAD PCI Biotronik AG DES 2.5 x13 2.75 mm), FFR D1 50-60% = 0.86 (small branch had ~70%), mCx ~50-60% FFR=1.0.  . PLANTAR FASCIA SURGERY Left 2004   "& neuroma"  . SHOULDER ARTHROSCOPY W/ ROTATOR CUFF REPAIR Left July 2011  . TRANSTHORACIC ECHOCARDIOGRAM N/A 05/2016   EF ~55%. Normal LV Size & function. NO RWMA (inferior HK no longer present). Mlid LVH - Gr 1 DD.. Mild LA dilation.     FAMHx: Family History  Problem Relation Age of Onset  . Heart failure Maternal Grandmother 91  . Cancer Paternal Grandmother 28  . Heart failure Paternal Grandfather 71  . Cancer - Colon Mother 76  . Heart attack Father 29       open heart surgery/valve replaced  . Pneumonia Father        Died from complications in 3614  . Lupus Father        Diagnosed just prior to death    SOCHx:  reports that he has never smoked. He has never used smokeless tobacco. He reports that he does not drink alcohol or use drugs.  ALLERGIES: No Known Allergies  HOME MEDICATIONS: Medications Prior to Admission  Medication Sig Dispense Refill Last Dose  . acetaminophen (TYLENOL) 500 MG tablet Take 500-1,000 mg by mouth every 6 (six) hours as needed (for pain.).   Past Week at Unknown time  . amLODipine (NORVASC) 10 MG tablet Take 1 tablet (10 mg total) by mouth daily. (Patient taking differently: Take 10 mg by mouth every evening. ) 180 tablet 3 06/05/2019 at 0600  . aspirin EC 81 MG tablet Take 81 mg by mouth daily.   06/05/2019 at 0600  . azelastine (ASTELIN) 0.1 % nasal spray Place 1 spray into both nostrils 2 (two) times daily as needed for rhinitis or allergies. Use in each nostril as directed     . clopidogrel (PLAVIX) 75 MG tablet TAKE 1 TABLET BY MOUTH DAILY (Patient taking  differently: Take 75 mg by mouth daily. ) 30 tablet 8 06/05/2019 at 0600  . Coenzyme Q10 (CO Q-10) 300 MG CAPS Take 300 mg by mouth daily. 90 each 11 06/05/2019 at 06000  . Coenzyme Q10 (COQ10) 100 MG CAPS Take 100 mg by mouth daily.     . Famotidine (PEPCID AC MAXIMUM STRENGTH) 20 MG CHEW Chew 20 mg by mouth daily as needed (acid reflux).   Past Month at Unknown time  . finasteride (PROSCAR) 5 MG tablet Take 5 mg by mouth daily.   06/05/2019 at 0600  . fluticasone (FLONASE) 50 MCG/ACT nasal spray Place 1 spray into the nose daily as needed for allergies. For allergies     06/05/2019 at 0600  . ibuprofen (ADVIL) 200 MG tablet Take 400-600 mg by mouth every 8 (eight) hours as needed (pain.).   Past Month at Unknown time  .  losartan (COZAAR) 25 MG tablet TAKE 1 TABLET BY MOUTH DAILY (Patient taking differently: Take 25 mg by mouth daily. ) 90 tablet 3 06/04/2019 at Unknown time  . Melatonin 10 MG CAPS Take 10 mg by mouth at bedtime as needed (sleep).   06/04/2019 at Unknown time  . Misc Natural Products (TURMERIC CURCUMIN) CAPS Take 1 capsule by mouth daily.   Past Month at Unknown time  . nitroGLYCERIN (NITROSTAT) 0.4 MG SL tablet Place 1 tablet (0.4 mg total) under the tongue every 5 (five) minutes as needed for chest pain. 25 tablet 4   . omeprazole (PRILOSEC) 40 MG capsule Take 40 mg by mouth daily before breakfast.   06/05/2019 at 0600  . potassium gluconate 595 (99 K) MG TABS tablet Take 595 mg by mouth daily.   Past Month at Unknown time  . ranolazine (RANEXA) 500 MG 12 hr tablet Take 1 tablet (500 mg total) by mouth 2 (two) times daily. 60 tablet 6 06/05/2019 at 0600  . rosuvastatin (CRESTOR) 20 MG tablet TAKE 1 TABLET BY MOUTH ONCE DAILY (Patient taking differently: Take 20 mg by mouth every evening. ) 90 tablet 1 06/04/2019 at Unknown time    Review of Systems  Constitutional: Positive for malaise/fatigue.  HENT: Negative for congestion.   Respiratory:       Negative not noted in HPI  Gastrointestinal:  Negative for blood in stool and melena.  Genitourinary: Negative for hematuria.  Musculoskeletal: Positive for joint pain.  Neurological: Positive for dizziness (Positional).  Psychiatric/Behavioral: Negative for memory loss. The patient is nervous/anxious. The patient does not have insomnia.      PHYSICAL EXAM:Blood pressure (!) 151/98, pulse 64, temperature 97.7 F (36.5 C), temperature source Skin, resp. rate 17, height 5\' 10"  (1.778 m), weight 93 kg, SpO2 99 %. General appearance: alert, cooperative, appears stated age and no distress Neck: no adenopathy, no carotid bruit and no JVD Lungs: clear to auscultation bilaterally and normal percussion bilaterally Heart: regular rate and rhythm, S1, S2 normal, no murmur, click, rub or gallop Abdomen: soft, non-tender; bowel sounds normal; no masses,  no organomegaly Extremities: extremities normal, atraumatic, no cyanosis or edema Neurologic: Grossly normal   Adult ECG Report N/A  IMPRESSION & PLAN Principal Problem:   Angina, class III (HCC) -atypical symptoms manifested as exertional dyspnea, fatigue and sweating Active Problems:   Abnormal nuclear stress test   The patients' history has been reviewed, patient examined, no change in status from most recent note, stable for surgery. I have reviewed the patients' chart and labs. Questions were answered to the patient's satisfaction.    Javell L Mundt has presented today for Cardiac Catheterization, with the diagnosis of coronary artery disease involving native coronary artery with unstable angina pectoris, and Abnormal nuclear stress test. The various methods of treatment have been discussed with the patient and family.   Risks / Complications include, but not limited to: Death, MI, CVA/TIA, VF/VT (with defibrillation), Bradycardia (need for temporary pacer placement), contrast induced nephropathy, bleeding / bruising / hematoma / pseudoaneurysm, vascular or coronary injury (with  possible emergent CT or Vascular Surgery), adverse medication reactions, infection.  Additional risks of atherectomy and temporary wire reviewed   After consideration of risks, benefits and other options for treatment, the patient has consented to Procedure(s):   CORONARY ATHERECTOMY and STENT PLACEMENT TEMPORARY TRANSVENOUS PACEMAKER  as a surgical intervention.   We will proceed with the planned procedure.   , MD  06/05/2019 10:55 AM

## 2019-06-05 NOTE — Progress Notes (Signed)
Site area: Right groin a 6 french arterial and 6 french venous sheath was removed.  Site Prior to Removal:  Level 0  Pressure Applied For 20 MINUTES     Bedrest Beginning at 1630p  Manual:   Yes.    Patient Status During Pull:  stable  Post Pull Groin Site:  Level 0  Post Pull Instructions Given:  Yes.    Post Pull Pulses Present:  Yes.    Dressing Applied:  Yes.    Comments:  Pt had a vagal episode and was bolus 200cc of IVF , trendelenburg position, and Zofran 4 mg given.  BP returned To normal and nausea subsided.

## 2019-06-06 ENCOUNTER — Encounter (HOSPITAL_COMMUNITY): Payer: Self-pay | Admitting: Cardiology

## 2019-06-06 DIAGNOSIS — E785 Hyperlipidemia, unspecified: Secondary | ICD-10-CM | POA: Diagnosis not present

## 2019-06-06 DIAGNOSIS — I209 Angina pectoris, unspecified: Secondary | ICD-10-CM

## 2019-06-06 DIAGNOSIS — R9439 Abnormal result of other cardiovascular function study: Secondary | ICD-10-CM | POA: Diagnosis not present

## 2019-06-06 DIAGNOSIS — M19042 Primary osteoarthritis, left hand: Secondary | ICD-10-CM | POA: Diagnosis not present

## 2019-06-06 DIAGNOSIS — I252 Old myocardial infarction: Secondary | ICD-10-CM | POA: Diagnosis not present

## 2019-06-06 DIAGNOSIS — K219 Gastro-esophageal reflux disease without esophagitis: Secondary | ICD-10-CM | POA: Diagnosis not present

## 2019-06-06 DIAGNOSIS — Z955 Presence of coronary angioplasty implant and graft: Secondary | ICD-10-CM | POA: Diagnosis not present

## 2019-06-06 DIAGNOSIS — I959 Hypotension, unspecified: Secondary | ICD-10-CM | POA: Diagnosis not present

## 2019-06-06 DIAGNOSIS — I2584 Coronary atherosclerosis due to calcified coronary lesion: Secondary | ICD-10-CM | POA: Diagnosis not present

## 2019-06-06 DIAGNOSIS — Z7982 Long term (current) use of aspirin: Secondary | ICD-10-CM | POA: Diagnosis not present

## 2019-06-06 DIAGNOSIS — N400 Enlarged prostate without lower urinary tract symptoms: Secondary | ICD-10-CM | POA: Diagnosis not present

## 2019-06-06 DIAGNOSIS — I25119 Atherosclerotic heart disease of native coronary artery with unspecified angina pectoris: Secondary | ICD-10-CM | POA: Diagnosis not present

## 2019-06-06 DIAGNOSIS — Z79899 Other long term (current) drug therapy: Secondary | ICD-10-CM | POA: Diagnosis not present

## 2019-06-06 DIAGNOSIS — M069 Rheumatoid arthritis, unspecified: Secondary | ICD-10-CM | POA: Diagnosis not present

## 2019-06-06 DIAGNOSIS — Z7902 Long term (current) use of antithrombotics/antiplatelets: Secondary | ICD-10-CM | POA: Diagnosis not present

## 2019-06-06 DIAGNOSIS — M19041 Primary osteoarthritis, right hand: Secondary | ICD-10-CM | POA: Diagnosis not present

## 2019-06-06 DIAGNOSIS — I10 Essential (primary) hypertension: Secondary | ICD-10-CM | POA: Diagnosis not present

## 2019-06-06 LAB — BASIC METABOLIC PANEL
Anion gap: 8 (ref 5–15)
BUN: 13 mg/dL (ref 8–23)
CO2: 27 mmol/L (ref 22–32)
Calcium: 8.9 mg/dL (ref 8.9–10.3)
Chloride: 104 mmol/L (ref 98–111)
Creatinine, Ser: 0.92 mg/dL (ref 0.61–1.24)
GFR calc Af Amer: >60 mL/min (ref >60)
GFR calc non Af Amer: >60 mL/min (ref >60)
Glucose, Bld: 81 mg/dL (ref 70–99)
Potassium: 3.8 mmol/L (ref 3.5–5.1)
Sodium: 139 mmol/L (ref 135–145)

## 2019-06-06 LAB — CBC
HCT: 38 % — ABNORMAL LOW (ref 39.0–52.0)
Hemoglobin: 13.3 g/dL (ref 13.0–17.0)
MCH: 30.6 pg (ref 26.0–34.0)
MCHC: 35 g/dL (ref 30.0–36.0)
MCV: 87.4 fL (ref 80.0–100.0)
Platelets: 154 10*3/uL (ref 150–400)
RBC: 4.35 MIL/uL (ref 4.22–5.81)
RDW: 12.6 % (ref 11.5–15.5)
WBC: 6.1 10*3/uL (ref 4.0–10.5)
nRBC: 0 % (ref 0.0–0.2)

## 2019-06-06 NOTE — Progress Notes (Signed)
Progress Note  Patient Name: [name] Date of Encounter: 06/06/2019  Primary Cardiologist: Glenetta Hew, MD   Subjective   No complaints this AM  Inpatient Medications    Scheduled Meds: . amLODipine  10 mg Oral QPM  . aspirin EC  81 mg Oral Daily  . clopidogrel  75 mg Oral Daily  . finasteride  5 mg Oral Daily  . losartan  25 mg Oral Daily  . pantoprazole  40 mg Oral Daily  . ranolazine  500 mg Oral BID  . rosuvastatin  20 mg Oral Daily  . sodium chloride flush  3 mL Intravenous Q12H   Continuous Infusions: . sodium chloride     PRN Meds: sodium chloride, acetaminophen, azelastine, fluticasone, Melatonin, nitroGLYCERIN, ondansetron (ZOFRAN) IV, sodium chloride flush   Vital Signs    Vitals:   06/05/19 1820 06/05/19 1850 06/05/19 2035 06/06/19 0346  BP: (!) 142/88 (!) 142/85 124/80 128/84  Pulse: 68 71 70 (!) 55  Resp: 14 17 20 17   Temp:   97.7 F (36.5 C) 98 F (36.7 C)  TempSrc:   Axillary Oral  SpO2: 98% 96% 96% 97%  Weight:    85.7 kg  Height:        Intake/Output Summary (Last 24 hours) at 06/06/2019 0745 Last data filed at 06/06/2019 0348 Gross per 24 hour  Intake 740 ml  Output 1875 ml  Net -1135 ml   Last 3 Weights 06/06/2019 06/05/2019 06/02/2019  Weight (lbs) 189 lb 205 lb 0.4 oz 205 lb  Weight (kg) 85.73 kg 93 kg 92.987 kg      Telemetry    NSR - Personally Reviewed  ECG    n/a - Personally Reviewed  Physical Exam   GEN: No acute distress.   Neck: No JVD Cardiac: RRR, no murmurs, rubs, or gallops.  Respiratory: Clear to auscultation bilaterally. GI: Soft, nontender, non-distended  MS: No edema; No deformity. Neuro:  Nonfocal  Psych: Normal affect   Labs    High Sensitivity Troponin:  No results for input(s): TROPONINIHS in the last 720 hours.    Chemistry Recent Labs  Lab 06/05/19 0845 06/06/19 0341  NA 140 139  K 3.9 3.8  CL 105 104  CO2 26 27  GLUCOSE 108* 81  BUN 21 13  CREATININE 0.91 0.92  CALCIUM 9.7 8.9    GFRNONAA >60 >60  GFRAA >60 >60  ANIONGAP 9 8     Hematology Recent Labs  Lab 06/05/19 0845 06/06/19 0341  WBC 5.5 6.1  RBC 5.09 4.35  HGB 15.6 13.3  HCT 44.1 38.0*  MCV 86.6 87.4  MCH 30.6 30.6  MCHC 35.4 35.0  RDW 12.5 12.6  PLT 157 154    BNPNo results for input(s): BNP, PROBNP in the last 168 hours.   DDimer No results for input(s): DDIMER in the last 168 hours.   Radiology    CARDIAC CATHETERIZATION  Result Date: 06/05/2019  Mid RCA-1 lesion is 85-70% stenosed. Prox RCA lesion is 60% stenosed.  Following overall atherectomy of the 85 to 70% lesion, Scoring balloon angioplasty was performed -at the original 85% lesion using a BALLOON WOLVERINE 3.00X10.  A drug-eluting stent was successfully placed using a STENT RESOLUTE ONYX 3.5X38. -Postdilated to 4.1 mm  Post intervention, there is a 0% residual stenosis of this long stent.  With attempted placement of the first stent, a new lesion became apparent: Mid RCA-2 lesion is 70% stenosed.  After high-pressure balloon angioplasty, A drug-eluting stent  was successfully placed overlapping the new stent and the old stent, using a STENT RESOLUTE ONYX 3.0X8. -This entire stent and both overlapping sites was postdilated with a 4.0 mm balloon to 3.75 mm.  Post intervention, there is a 0% residual stenosis of the majority the stent, but a discrete eccentric 10% stenosis remains at the original lesion.Marland Kitchen  ---  Previously placed Mid RCA to Dist RCA drug-eluting is widely patent -this stent is now overlapped with a new 3.0 mm 8 mm stent.  SUMMARY  Successful difficult, complex orbital atherectomy based DES PCI of mid RCA with 2 overlapping resolute DES stents (3.0 mm 8 mm distal, 3.5 mm 38 mm proximal--postdilated in taper fashion from 4.1 to 3.75 mm) with distal stent overlapping the previous RCA stent  Transient vasovagal hypotension exacerbated by IC nitroglycerin and sedation requiring short-term of transvenous pacing and Levophed  infusion; resolved RECOMMENDATIONS  Due to the short run of hypotension requiring inotropic support, I feel safer to watch him overnight and plan discharge tomorrow.   Sheath sutured in place to be removed in the PACU holding area  He will follow-up with me as scheduled.  Continue home medications, has been difficult to titrate in the outpatient setting. [name], MD   Cardiac Studies     Patient Profile     [name] is a 64 y.o. male with past medical history of inferior STEMI-RCA PCI back in March 2013 followed by unstable angina in July 2015 with stents placed to the distal LAD.  He has been having progressively worsening exertional dyspnea and chest tightness/angina over the last 6 months.  6 months ago he did have a nuclear stress test suggesting inferolateral ischemia.  We attempted medical management that has now become unsuccessful.  He was referred for diagnostic cardiac catheterization on June 02, 2019 which revealed heavily calcified mid RCA stenoses of 60, 85 and 70% proximal to the previously placed stent.  We did DFR measurement on lesions in a diagonal Lavance Beazer, mid circumflex and ramus intermedius that were all hemodynamically not significant.  After completing all these procedures, there was concern for extent of contrast and radiation, therefore we decided to stage atherectomy based PCI of the RCA for another day.  Assessment & Plan    1. CAD - history of prior STEMI with PCI to RCA in 2013 - unstable angina July 2015, stents placed to distal LAD - abnormal nuclear stress with inferolateral ischemia. Failed medical therapy, referred for cath - 06/02/19 cath with heavily calcified RCA, brought back yesterday for intervention.   06/05/19 received orbital atherectomy and scoring balloon angiopalsty, DES x2 to RCA.  - procedure complicated by transient vasovagal hypotension requiring shot term pacing and pressor use. Plan was to monitor overnight  - vitals look good  overnight. No complaints, cath site looks good.   - medical therapy with ASA 81, plavix 75, losartan 25, crestor 20. Has not been on beta blocker I assume due to low normal to mild bradycardia   We will discharge today  For questions or updates, please contact [facility] Please consult [email or web address] for contact info under        Signed, Dina Rich, MD  06/06/2019, 7:45 AM

## 2019-06-06 NOTE — Discharge Summary (Signed)
Discharge Summary    Patient ID: [name] MRN: [id number]; DOB: 1955-12-28  Admit date: 06/05/2019 Discharge date: 06/06/2019  Primary Care Provider: Philemon Kingdom, MD  Primary Cardiologist: [name], MD  Primary Electrophysiologist:  None   Discharge Diagnoses    Principal Problem:   Angina, class III (HCC) -atypical symptoms manifested as exertional dyspnea, fatigue and sweating  **S/p PCI/DES x 2 to the proximal and mid RCA this admission.  Active Problems:   CAD S/P PCI to RCA (2013) and LAD (2015); therapeutic P2Y12 for Plavix   Essential hypertension   Dyslipidemia, goal LDL below 70   Abnormal nuclear stress test   Anxiety   BPH (benign prostatic hyperplasia)  Diagnostic Studies/Procedures    Cardiac Catheterization and Percutaneous Coronary Intervention 3.5.2021  Right Coronary Artery  Prox RCA lesion 60% stenosed  Prox RCA lesion is 60% stenosed. The lesion is segmental and eccentric. The lesion is moderately calcified.  Mid RCA-1 lesion 85% stenosed  Mid RCA-1 lesion is 85% stenosed. The lesion is eccentric and irregular. The lesion is severely calcified. Tandem 85 to 70% calcified lesions  Mid RCA-2 lesion 70% stenosed  Mid RCA-2 lesion is 70% stenosed. The lesion is located at the bend, focal and eccentric. The lesion is moderately calcified. Not originally seen, thought to be stent, but was actually calcified plaque  Mid RCA to Dist RCA lesion 0% stenosed  Previously placed Mid RCA to Dist RCA stent (unknown type) is widely patent.  Acute Marginal Branch  Vessel is small in size.  Right Ventricular Branch  Vessel is small in size.  Inferior Septal  Vessel is small in size.  Right Posterior Atrioventricular Artery  Vessel is small in size.  First Right Posterolateral Branch  Vessel is small in size.   SUMMARY  Successful difficult, complex orbital atherectomy based DES PCI of mid RCA with 2 overlapping resolute DES stents (3.0 mm 8 mm  distal, 3.5 mm 38 mm proximal--postdilated in taper fashion from 4.1 to 3.75 mm) with distal stent overlapping the previous RCA stent  Transient vasovagal hypotension exacerbated by IC nitroglycerin and sedation requiring short-term of transvenous pacing and Levophed infusion; resolved. _____________   History of Present Illness     [name] is a 64 y.o. male with a history of coronary artery disease status post prior inferior MI with RCA and subsequent LAD stenting, hypertension, hyperlipidemia, rheumatoid arthritis, GERD, BPH, and bipolar disorder.  As noted, he suffered an inferior STEMI in March 2013 requiring PCI and drug-eluting stent placement to the right coronary artery.  In July 2015, in the setting of unstable angina, catheterization revealed severe distal LAD disease and this required stent placement.  He has been followed closely as an outpatient and over the past 6 months, he has been experiencing exertional dyspnea and chest tightness.  Stress testing in late 2020 showed inferolateral ischemia.  Medical therapy was initiated however, patient remained symptomatic with lifestyle limiting angina.  As result, he underwent diagnostic catheterization on June 02, 2019 revealing heavily calcified mid RCA with 60% proximal stenosis and 85/70% mid RCA stenoses.  He also had moderate disease in the diagonal branch, mid circumflex, and ramus intermedius.  DFR was performed and those lesions and was not hemodynamically significant.  Because of prolonged radiation exposure during catheterization, decision was made to pursue staged PCI of the RCA on March 5.  Hospital Course     Consultants: None  Patient presented to the [facility] cardiac catheterization laboratory and  underwent diagnostic catheterization of the right coronary artery, again revealing a 6% proximal stenosis with 85 and 70% mid stenoses.  The distal RCA stent remained widely patent.  He then underwent orbital atherectomy and  placement of a 3.5 x 38 mm resolute drug-eluting stent in the proximal and mid RCA and a 3.0 x 8 mm resolute drug-eluting stent in the mid to distal RCA, overlapping both the proximal stent and the previously placed distal stent.  During the procedure, patient had transient vasovagal hypotension briefly requiring transvenous pacing and Levophed infusion.  This was discontinued prior to leaving the procedural area.  He was admitted and monitored overnight without any recurrent symptoms or limitations.  He has not had any significant abnormalities noted on telemetry and has been hemodynamically stable.  He has been ambulating without difficulty and will be discharged home today in good condition.  He will be seen back in clinic within the next two weeks.  Did the patient have an acute coronary syndrome (MI, NSTEMI, STEMI, etc) this admission?:  No                               Did the patient have a percutaneous coronary intervention (stent / angioplasty)?:  Yes.     Cath/PCI Registry Performance & Quality Measures: 1. Aspirin prescribed? - Yes 2. ADP Receptor Inhibitor (Plavix/Clopidogrel, Brilinta/Ticagrelor or Effient/Prasugrel) prescribed (includes medically managed patients)? - Yes 3. High Intensity Statin (Lipitor 40-80mg  or Crestor 20-40mg ) prescribed? - Yes 4. For EF <40%, was ACEI/ARB prescribed? - Not Applicable (EF >/= 23%) 5. For EF <40%, Aldosterone Antagonist (Spironolactone or Eplerenone) prescribed? - Not Applicable (EF >/= 76%) 6. Cardiac Rehab Phase II ordered (Included Medically managed Patients)? - Yes   _____________  Discharge Vitals Blood pressure 128/84, pulse (!) 55, temperature 98 F (36.7 C), temperature source Oral, resp. rate 17, height 5\' 10"  (1.778 m), weight 85.7 kg, SpO2 97 %.  Filed Weights   06/05/19 0756 06/06/19 0346  Weight: 93 kg 85.7 kg    Labs & Radiologic Studies    CBC Recent Labs    06/05/19 0845 06/06/19 0341  WBC 5.5 6.1  HGB 15.6 13.3  HCT  44.1 38.0*  MCV 86.6 87.4  PLT 157 283   Basic Metabolic Panel Recent Labs    06/05/19 0845 06/06/19 0341  NA 140 139  K 3.9 3.8  CL 105 104  CO2 26 27  GLUCOSE 108* 81  BUN 21 13  CREATININE 0.91 0.92  CALCIUM 9.7 8.9  _____________   Disposition   Pt is being discharged home today in good condition.  Follow-up Plans & Appointments    Follow-up Information    Leonie Man, MD Follow up on 06/16/2019.   Specialty: Cardiology Why: 10:00 AM Contact information: [address] [place] [address] [phone]          Discharge Instructions    Call MD for:  difficulty breathing, headache or visual disturbances   Complete by: As directed    Call MD for:  redness, tenderness, or signs of infection (pain, swelling, redness, odor or green/yellow discharge around incision site)   Complete by: As directed    Call MD for:  severe uncontrolled pain   Complete by: As directed    Call MD for:  temperature >100.4   Complete by: As directed    Diet - low sodium heart healthy   Complete by: As  directed    Increase activity slowly   Complete by: As directed       Discharge Medications   Allergies as of 06/06/2019   No Known Allergies     Medication List    STOP taking these medications   ibuprofen 200 MG tablet Commonly known as: ADVIL     TAKE these medications   acetaminophen 500 MG tablet Commonly known as: TYLENOL Take 500-1,000 mg by mouth every 6 (six) hours as needed (for pain.).   amLODipine 10 MG tablet Commonly known as: NORVASC Take 1 tablet (10 mg total) by mouth daily. What changed: when to take this   aspirin EC 81 MG tablet Take 81 mg by mouth daily.   azelastine 0.1 % nasal spray Commonly known as: ASTELIN Place 1 spray into both nostrils 2 (two) times daily as needed for rhinitis or allergies. Use in each nostril as directed   clopidogrel 75 MG tablet Commonly known as: PLAVIX TAKE 1 TABLET BY MOUTH DAILY   Co  Q-10 300 MG Caps Take 300 mg by mouth daily.   finasteride 5 MG tablet Commonly known as: PROSCAR Take 5 mg by mouth daily.   fluticasone 50 MCG/ACT nasal spray Commonly known as: FLONASE Place 1 spray into the nose daily as needed for allergies. For allergies   losartan 25 MG tablet Commonly known as: COZAAR TAKE 1 TABLET BY MOUTH DAILY   Melatonin 10 MG Caps Take 10 mg by mouth at bedtime as needed (sleep).   nitroGLYCERIN 0.4 MG SL tablet Commonly known as: NITROSTAT Place 1 tablet (0.4 mg total) under the tongue every 5 (five) minutes as needed for chest pain.   omeprazole 40 MG capsule Commonly known as: PRILOSEC Take 40 mg by mouth daily before breakfast.   Pepcid AC Maximum Strength 20 MG Chew Generic drug: Famotidine Chew 20 mg by mouth daily as needed (acid reflux).   potassium gluconate 595 (99 K) MG Tabs tablet Take 595 mg by mouth daily.   ranolazine 500 MG 12 hr tablet Commonly known as: RANEXA Take 1 tablet (500 mg total) by mouth 2 (two) times daily.   rosuvastatin 20 MG tablet Commonly known as: CRESTOR TAKE 1 TABLET BY MOUTH ONCE DAILY What changed: when to take this   Turmeric Curcumin Caps Take 1 capsule by mouth daily.        Outstanding Labs/Studies   None  Duration of Discharge Encounter   Greater than 30 minutes including physician time.  Signed, Nicolasa Ducking, NP 06/06/2019, 8:29 AM

## 2019-06-06 NOTE — Progress Notes (Signed)
CARDIAC REHAB PHASE I   PRE:  Rate/Rhythm: 69 NSR  BP:  Sitting: 151/85        SaO2: 97% RA  MODE:  Ambulation: 470 ft   POST:  Rate/Rhythm: 86 NSR  BP:  Sitting: 150/81      SaO2: 96% RA  Pt ambulated independently 470 ft. Pt denied SOB or CP. Pt was educated on exercise guidelines, risk factors, restrictions, nutrition, wound care, use of Plavix, and CRP II. Pt stated he participated in CRP II previously at [facility]. Pt will be referred to CRP II [place].   [id number]  [name] BS, ACSM CEP 06/06/2019  9:32 AM

## 2019-06-06 NOTE — Discharge Instructions (Signed)
**  PLEASE REMEMBER TO BRING ALL OF YOUR MEDICATIONS TO EACH OF YOUR FOLLOW-UP OFFICE VISITS.  NO HEAVY LIFTING OR SEXUAL ACTIVITY X 7 DAYS. NO DRIVING X 3-5 DAYS. NO SOAKING BATHS, HOT TUBS, POOLS, ETC., X 7 DAYS.  Groin Site Care Refer to this sheet in the next few weeks. These instructions provide you with information on caring for yourself after your procedure. Your caregiver may also give you more specific instructions. Your treatment has been planned according to current medical practices, but problems sometimes occur. Call your caregiver if you have any problems or questions after your procedure. HOME CARE INSTRUCTIONS  You may shower 24 hours after the procedure. Remove the bandage (dressing) and gently wash the site with plain soap and water. Gently pat the site dry.   Do not apply powder or lotion to the site.   Do not sit in a bathtub, swimming pool, or whirlpool for 5 to 7 days.   No bending, squatting, or lifting anything over 10 pounds (4.5 kg) as directed by your caregiver.   Inspect the site at least twice daily.   Do not drive home if you are discharged the same day of the procedure. Have someone else drive you.   What to expect:  Any bruising will usually fade within 1 to 2 weeks.   Blood that collects in the tissue (hematoma) may be painful to the touch. It should usually decrease in size and tenderness within 1 to 2 weeks.  SEEK IMMEDIATE MEDICAL CARE IF:  You have unusual pain at the groin site or down the affected leg.   You have redness, warmth, swelling, or pain at the groin site.   You have drainage (other than a small amount of blood on the dressing).   You have chills.   You have a fever or persistent symptoms for more than 72 hours.   You have a fever and your symptoms suddenly get worse.   Your leg becomes pale, cool, tingly, or numb.  You have heavy bleeding from the site. Hold pressure on the site. . _____________     10 Habits of Highly  Healthy People  [facility] wants to help you get well and stay well.  Live a longer, healthier life by practicing healthy habits every day.  1.  Visit your primary care provider regularly. 2.  Make time for family and friends.  Healthy relationships are important. 3.  Take medications as directed by your provider. 4.  Maintain a healthy weight and a trim waistline. 5.  Eat healthy meals and snacks, rich in fruits, vegetables, whole grains, and lean proteins. 6.  Get moving every day - aim for 150 minutes of moderate physical activity each week. 7.  Don't smoke. 8.  Avoid alcohol or drink in moderation. 9.  Manage stress through meditation or mindful relaxation. 10.  Get seven to nine hours of quality sleep each night.  Want more information on healthy habits?  To learn more about these and other healthy habits, visit [email or web address]. _____________     

## 2019-06-08 MED FILL — Clopidogrel Bisulfate Tab 75 MG (Base Equiv): ORAL | Qty: 2 | Status: AC

## 2019-06-13 ENCOUNTER — Other Ambulatory Visit: Payer: Self-pay | Admitting: Cardiology

## 2019-06-16 ENCOUNTER — Encounter: Payer: Self-pay | Admitting: Cardiology

## 2019-06-16 ENCOUNTER — Ambulatory Visit: Payer: Federal, State, Local not specified - PPO | Admitting: Cardiology

## 2019-06-16 ENCOUNTER — Other Ambulatory Visit: Payer: Self-pay

## 2019-06-16 VITALS — BP 120/70 | HR 64 | Temp 95.4°F | Ht 70.0 in | Wt 194.4 lb

## 2019-06-16 DIAGNOSIS — I10 Essential (primary) hypertension: Secondary | ICD-10-CM

## 2019-06-16 DIAGNOSIS — I209 Angina pectoris, unspecified: Secondary | ICD-10-CM

## 2019-06-16 DIAGNOSIS — E785 Hyperlipidemia, unspecified: Secondary | ICD-10-CM | POA: Diagnosis not present

## 2019-06-16 DIAGNOSIS — Z9861 Coronary angioplasty status: Secondary | ICD-10-CM

## 2019-06-16 DIAGNOSIS — R5383 Other fatigue: Secondary | ICD-10-CM

## 2019-06-16 DIAGNOSIS — I2510 Atherosclerotic heart disease of native coronary artery without angina pectoris: Secondary | ICD-10-CM

## 2019-06-16 NOTE — Patient Instructions (Signed)
Medication Instructions:  Wean Ranexa to 1/2 tablet twice a day for 2 weeks , if you do not have increase anginal /chest discomfort  - stop taking medication - if  Symptoms return continue taking regular dose of Ranexa. *If you need a refill on your cardiac medications before your next appointment, please call your pharmacy*   Lab Work: Not needed  Testing/Procedures:  not needed   Follow-Up: At [facility], you and your health needs are our priority.  As part of our continuing mission to provide you with exceptional heart care, we have created designated Provider Care Teams.  These Care Teams include your primary Cardiologist (physician) and Advanced Practice Providers (APPs -  Physician Assistants and Nurse Practitioners) who all work together to provide you with the care you need, when you need it.  We recommend signing up for the patient portal called "MyChart".  Sign up information is provided on this After Visit Summary.  MyChart is used to connect with patients for Virtual Visits (Telemedicine).  Patients are able to view lab/test results, encounter notes, upcoming appointments, etc.  Non-urgent messages can be sent to your provider as well.   To learn more about what you can do with MyChart, go to [email or web address].    Your next appointment:   6 month(s)  The format for your next appointment:   In Person  Provider:   [name], MD

## 2019-06-16 NOTE — Progress Notes (Signed)
Primary Care Provider: Philemon Kingdom, MD Cardiologist: [name], MD Electrophysiologist: None  Clinic Note: Chief Complaint  Patient presents with  . Hospitalization Follow-up    Status post staged PCI of the RCA with CSI  . Coronary Artery Disease    No further angina    HPI:     [name] is a 64 y.o. male with a PMH notable for prior STEMI with CAD-PCI who presents today for evaluation of exertional dyspnea with palpitations.  CADHistory:   Inferior MI with PCI to the RCA in 2013.   Crescendo angina in July 2015 -> abnormal Myoview --> cath showed severe mid LAD disease treated with DES stent. Also had D1 and circumflex disease evaluated with FFR --> nonsignificant. Medical therapy.  Crescendo angina March 2021: (Previously had a Myoview showing inferior ischemia) -> cath with moderate lesions in the LCA all negative by DFR, extensive mid-distal RCA disease, calcified prior to previous stent--CSI DES PCI 2 overlapping stents that overlap the distal stent. ->  Reviewed below  [name] was last seen on July 07, 2018 via telemedicine as follow-up from [facility] stress test ordered after May 14, 2018 visit with worsening exertional dyspnea uphill is up steps.  No chest pain or pressure.  He noted that he he was still having some shortness of breath walking uphill or longer walks.  But overall was feeling little better after stopping Naprosyn.  Felt that the dyspnea with significant exertion. ->  Myoview results was actually somewhat confounding and not helpful. --> Noted that he dyspnea improved with improving nasal congestion.  We did plan cardiac catheterization once COVID-19 restrictions lifted.  Recent Hospitalizations: None  Reviewed  CV studies:    The following studies were reviewed today: (if available, images/films reviewed: From Epic Chart or Care Everywhere) . Cardiac Cath 06/02/2019:  Severe RIGHT CORONARY ARTERY disease with tandem 80  and 70% lesions, heavily calcified as likely culprit (prior to previous stent)  Moderate (DFR negative) proximal D1, mid LAD and mid LCx lesions with otherwise widely patent distal LAD stent.  Normal LVEF and EDP.  Diagnostic Dominance: Right    06/05/2019 staged atherectomy PCI RCA: Resolute Onyx 3.0 mm 8 mm overlapping most distal stent followed by 3.5 mm x 38 mm proximal covering up to the 60% lesion. ->  Very difficult to make the been to overlap stents.  There apparently was a lesion in that band.  This required high pressure post dilation.      Interval History:   [name] presents today for post PCI follow-up doing quite well.  He had 2 short little episodes where he had some unusual symptoms in his chest, but otherwise has been doing quite well.  He is been very happy with the results since PCI.  He is noticing a little bit of lightheadedness after taking losartan in the morning, but is otherwise doing well with his medications.  He is walking now pretty routinely with just minimal shortness of breath, but is gradually building back up into being in shape.  He is not having any further anginal symptoms with rest or exertion.  Just those 2 short little episodes of feeling a weird sensation of probably a few palpitations.  Nothing prolonged no rapid regular heartbeats.  No heart failure symptoms   CV Review of Symptoms (Summary): positive for - irregular heartbeat and Rare infrequent rapid heartbeats. negative for - chest pain, dyspnea on exertion, edema, irregular heartbeat, orthopnea, palpitations, paroxysmal nocturnal  dyspnea, rapid heart rate, shortness of breath or Syncope/near syncope, TIA/amaurosis fugax, claudication; shortness of breath with exertion is is relative to being out of shape and not anywhere near the extent it was prior to his PCI.  The patient DOES NOT have symptoms concerning for COVID-19 infection (fever, chills, cough, or new shortness of breath).  The  patient is practicing social distancing & Masking.  He is waiting to hear about eligibility for COVID-19 vaccine.  His wife has had her shots.   REVIEWED OF SYSTEMS   Review of Systems  Constitutional: Negative for malaise/fatigue (Energy level feels much better) and weight loss.  HENT: Negative for nosebleeds.   Gastrointestinal: Negative for blood in stool and melena.  Genitourinary: Negative for hematuria.  Musculoskeletal: Positive for myalgias (He still has some cramping and muscle tightness, but not significant.).  Neurological: Negative for dizziness (Rare), focal weakness, weakness and headaches.  Psychiatric/Behavioral: Negative for memory loss. The patient is nervous/anxious and has insomnia (Still has difficulty falling asleep.).     I have reviewed and (if needed) personally updated the patient's problem list, medications, allergies, past medical and surgical history, social and family history.   PAST MEDICAL HISTORY   Past Medical History:  Diagnosis Date  . Anxiety   . Arthritis    "mostly in my hands; sometimes other areas" (10/20/2013)  . Bipolar disorder (HCC)   . BPH (benign prostatic hyperplasia)   . CAD S/P percutaneous coronary angioplasty 3/'13, 7/'15; 3/'21   (Therapeutic P2Y12 on Plavix); a) STEMI -m RCA DES PCI; 2015: Class III angina (abnl MYOVIEW w/ Ant-AntLat ischemia) -> m-d LAD 95-99% (PCI: Biotronik AG 2 DES 2.5 x 13, 2.75 mm); b. 3/'21 Cath/Staged PCI: LM nl, mLAD 60% (DFR .96), D1 65%  (DFR .95), mCx 60% (DFR .96); pRCA 60% - m-d 85&70% (CSI -> 3.5x38 Resolute DES p-m, 3.0x8 Resolute DES m-d connecting new & old  STENTS). EF 55-65%.  . Depression   . Dizziness of unknown cause January 2011   abn MRI, Neuro w/u Jan 2011  . Dyslipidemia, goal LDL below 70   . Essential hypertension   . GERD (gastroesophageal reflux disease)   . RA (rheumatoid arthritis) (HCC)   . Rheumatic fever 1969  . ST elevation myocardial infarction (STEMI) involving right  coronary artery in recovery phase (HCC) 06/2011   100% occluded RCA; PCI - 3.0x24 Promus DES (3.5-3.6 mm) ; Mod w moderate LAD & Cx disease, EF 55-60% w/ basal inferoseptal HK confirmed by echo    PAST SURGICAL HISTORY   Past Surgical History:  Procedure Laterality Date  . CORONARY ATHERECTOMY N/A 06/05/2019   Procedure: CORONARY ATHERECTOMY;  Surgeon: Marykay Lex, MD;  Location: [facility] INVASIVE CV LAB;  Service: Cardiovascular; orbital atherectomy-mid RCA  . CORONARY STENT INTERVENTION N/A 06/05/2019   Procedure: CORONARY STENT INTERVENTION;  Surgeon: Marykay Lex, MD;  Location: [facility] INVASIVE CV LAB;  Service: Cardiovascular;; pRCA 60% - m-d 85&70% (CSI -> 3.5x38 Resolute DES p-m, 3.0x8 Resolute DES m-d connecting new & old  STENTS).   . INTRAVASCULAR PRESSURE WIRE/FFR STUDY N/A 06/02/2019   Procedure: INTRAVASCULAR PRESSURE WIRE/FFR STUDY;  Surgeon: Marykay Lex, MD;  Location: [facility] INVASIVE CV LAB;  Service: Cardiovascular;  LCA: mLAD 60% (DFR .96), D1 65%  (DFR .95), mCx 60% (DFR .96)  . LEFT HEART CATH AND CORONARY ANGIOGRAPHY N/A 06/02/2019   Procedure: LEFT HEART CATH AND CORONARY ANGIOGRAPHY;  Surgeon: Marykay Lex, MD;  Location[facility] INVASIVE CV LAB;  Service: Cardiovascular;;; LM - nml; mLAD 60% (DFR .96), D1 65%  (DFR .95), mCx 60% (DFR .96); mRCA 60%, 85%&70% (staged CSI PCI)  . LEFT HEART CATHETERIZATION WITH CORONARY ANGIOGRAM N/A 06/28/2011   Procedure: LEFT HEART CATHETERIZATION WITH CORONARY ANGIOGRAM;  Surgeon: Leonie Man, MD;  Location: [facility] CATH LAB;  50% LAD after D1, D1 30%.  Small D2.  Large caliber LCx with (small OM1) major OM 2 and 3 followed by ABG circumflex with 40% focal lesion terminating with LPL 1.  100% mid RCA (DES PCI); beyond 100% RCA  - large PDA (double barrel) and small PAV-PL -EF 55 to 60% w/ midInf-LAf HK.  Marland Kitchen LEFT HEART CATHETERIZATION WITH CORONARY ANGIOGRAM N/A 10/20/2013   Procedure: LEFT HEART CATHETERIZATION WITH CORONARY ANGIOGRAM;  Surgeon: Leonie Man, MD;  Location: [facility] CATH LAB;  Service: Cardiovascular;  Laterality: N/A;  . NM MYOVIEW LTD N/A 10/19/2013   INTERMEDIATE RISK, septal, anterior-anterolateral ischemia --> referred for cath the following day -- LAD LESION TREATED WITH DES PCI  . PERCUTANEOUS CORONARY STENT INTERVENTION (PCI-S) N/A 06/2011; 09/2013   a) 3/'13: 100%  RCA ->PCI 3.20x2 (3.6) Promus DES stent; 7/'15: mLAD PCI Biotronik AG DES 2.5 x13 2.75 mm), FFR D1 50-60% = 0.86 (small branch had ~70%), mCx ~50-60% FFR=1.0.  . PLANTAR FASCIA SURGERY Left 2004   "& neuroma"  . SHOULDER ARTHROSCOPY W/ ROTATOR CUFF REPAIR Left July 2011  . TEMPORARY PACEMAKER N/A 06/05/2019   Procedure: TEMPORARY PACEMAKER;  Surgeon: Leonie Man, MD;  Location: [place] CV LAB;  Service: Cardiovascular;  Laterality: N/A;  . TRANSTHORACIC ECHOCARDIOGRAM N/A 05/2016   EF ~55%. Normal LV Size & function. NO RWMA (inferior HK no longer present). Mlid LVH - Gr 1 DD.. Mild LA dilation.     Myoview 05/23/2018 -> EF estimated 45 to 50%.  Hypertensive response to exercise.  Medium sized moderate severity defect in the basal inferior mid inferolateral wall (read as intermediate risk) -> the defect actually appears to improve on stress imaging suggesting hibernating myocardium.  MEDICATIONS/ALLERGIES   Current Meds  Medication Sig  . acetaminophen (TYLENOL) 500 MG tablet Take 500-1,000 mg by mouth every 6 (six) hours as needed (for pain.).  Marland Kitchen amLODipine (NORVASC) 10 MG tablet TAKE 1 TABLET BY MOUTH DAILY  . aspirin EC 81 MG tablet Take 81 mg by mouth daily.  Marland Kitchen azelastine (ASTELIN) 0.1 % nasal spray Place 1 spray into both nostrils 2 (two) times daily as needed for rhinitis or allergies. Use in each nostril as directed  . clopidogrel (PLAVIX) 75 MG tablet TAKE 1 TABLET BY MOUTH DAILY (Patient taking differently: Take 75 mg by mouth daily. )  . Coenzyme Q10 (CO Q-10) 300 MG CAPS Take 300 mg by mouth daily.  . Famotidine (PEPCID AC MAXIMUM  STRENGTH) 20 MG CHEW Chew 20 mg by mouth daily as needed (acid reflux).  . finasteride (PROSCAR) 5 MG tablet Take 5 mg by mouth daily.  . fluticasone (FLONASE) 50 MCG/ACT nasal spray Place 1 spray into the nose daily as needed for allergies. For allergies    . losartan (COZAAR) 25 MG tablet TAKE 1 TABLET BY MOUTH DAILY (Patient taking differently: Take 25 mg by mouth daily. )  . Melatonin 10 MG CAPS Take 10 mg by mouth at bedtime as needed (sleep).  . Misc Natural Products (TURMERIC CURCUMIN) CAPS Take 1 capsule by mouth daily.  . nitroGLYCERIN (NITROSTAT) 0.4 MG SL tablet Place 1 tablet (0.4 mg total) under  the tongue every 5 (five) minutes as needed for chest pain.  Marland Kitchen omeprazole (PRILOSEC) 40 MG capsule Take 40 mg by mouth daily before breakfast.  . potassium gluconate 595 (99 K) MG TABS tablet Take 595 mg by mouth daily.  . ranolazine (RANEXA) 500 MG 12 hr tablet Take 1 tablet (500 mg total) by mouth 2 (two) times daily.  . rosuvastatin (CRESTOR) 20 MG tablet TAKE 1 TABLET BY MOUTH ONCE DAILY (Patient taking differently: Take 20 mg by mouth every evening. )    No Known Allergies  SOCIAL HISTORY/FAMILY HISTORY   Reviewed in Epic:  Pertinent findings: None  OBJCTIVE -PE, EKG, labs   Wt Readings from Last 3 Encounters:  06/16/19 194 lb 6.4 oz (88.2 kg)  06/06/19 189 lb (85.7 kg)  06/02/19 205 lb (93 kg)    Physical Exam: BP 120/70   Pulse 64   Temp (!) 95.4 F (35.2 C)   Ht 5\' 10"  (1.778 m)   Wt 194 lb 6.4 oz (88.2 kg)   SpO2 91%   BMI 27.89 kg/m  Physical Exam  Constitutional: He is oriented to person, place, and time. He appears well-developed and well-nourished. No distress.  Healthy-appearing.  Well-groomed.  HENT:  Head: Normocephalic and atraumatic.  Neck: Normal carotid pulses, no hepatojugular reflux and no JVD present. Carotid bruit is not present.  Cardiovascular: Normal rate, regular rhythm, normal heart sounds and intact distal pulses.  No extrasystoles are  present. PMI is not displaced. Exam reveals no gallop and no friction rub.  Pulmonary/Chest: Effort normal and breath sounds normal. No respiratory distress. He has no wheezes. He has no rales.  Musculoskeletal:        General: No edema. Normal range of motion.     Cervical back: Normal range of motion and neck supple.     Comments: Both radial and groin cath sites are intact.  Is mild bruising in the right inguinal area.  No hematoma.  Neurological: He is alert and oriented to person, place, and time.  Psychiatric: He has a normal mood and affect. His behavior is normal. Judgment and thought content normal.  Vitals reviewed.   Adult ECG Report  Rate: 64;  Rhythm: 1 deg AV block.  Left atrial enlargement, borderline LVH.  Borderline criteria for inferior MI, age-indeterminate.  Narrative Interpretation: Stable EKG.  No ischemic changes.  No ischemic changes noted  Recent Labs: Labs checked by PCP May 18, 2019 not completely available.  Total cholesterol is 150 which is similar to the previous level.  Suspect that his labs are pretty much similar.  Unfortunate I do not have his LDL levels. Lab Results  Component Value Date   CHOL 155 04/15/2018   HDL 40 04/15/2018   LDLCALC 86 04/15/2018   TRIG 146 04/15/2018   CHOLHDL 3.9 04/15/2018   Lab Results  Component Value Date   CREATININE 0.92 06/06/2019   BUN 13 06/06/2019   NA 139 06/06/2019   K 3.8 06/06/2019   CL 104 06/06/2019   CO2 27 06/06/2019   Lab Results  Component Value Date   TSH 0.841 10/16/2013    ASSESSMENT/PLAN    Problem List Items Addressed This Visit    Essential hypertension (Chronic)    BP stable on amlodipine & ARB - plan to split dose interval - once in AM & other PM.  No Beta Blocker - bradycardia & fatigue      CAD S/P PCI to RCA (2013, 2021) and LAD (2015); therapeutic  P2Y12 for Plavix - Primary (Chronic)    He now has multiple overlapping DES stents in the RCA as well as mid LAD stent and  moderate lesions elsewhere. No further angina.  Plan:   He is on amlodipine which she has been able to tolerate well for antianginal benefit.    Not able to tolerate beta-blocker because of bradycardia and fatigue.  We started ARB, will have him change it to taking it later in the day.  This way is not overlapping with amlodipine.  We started Ranexa prior to PCI, he is hoping that he can come off of this.  We will try to have him wean to 1/2 tablet twice daily for 2 weeks and then if symptoms are no longer present, he can simply stop.  If symptoms were to recur, he will restart.  Did not tolerate Imdur.   On modest dose of rosuvastatin.  Continue aspirin Plavix for 6 months, will then discontinue aspirin and then continue lifelong Plavix.    As of September 2021, okay to hold Plavix 5 to 7 days preop for procedures.      Relevant Orders   EKG 12-Lead (Completed)   Fatigue due to treatment (Chronic)    Unable to tolerate beta-blocker.  No change in symptoms having tried statin holiday.      Dyslipidemia, goal LDL below 70 (Chronic)    He is on stable dose of rosuvastatin.  Lipids were quite at goal as of last year, but we will increase his dose.  Unfortunately do not have his full lipids for this year.  We will need to get his labs from his PCP.  Did not show up on the K PN.      Angina, class III (HCC) -atypical symptoms manifested as exertional dyspnea, fatigue and sweating    Resolved following PCI of RCA -- following remaining moderate LCA disease.   Will attempt to wean off Ranexa       Relevant Orders   EKG 12-Lead (Completed)     COVID-19 Education: The signs and symptoms of COVID-19 were discussed with the patient and how to seek care for testing (follow up with PCP or arrange E-visit).   The importance of social distancing was discussed today.  I spent a total of 18 minutes with the patient. >  50% of the time was spent in direct patient consultation.   Additional time spent with chart review  / charting (studies, outside notes, etc): 10 Total Time: 28 min   Current medicines are reviewed at length with the patient today.  (+/- concerns) n/a   Patient Instructions / Medication Changes & Studies & Tests Ordered   Patient Instructions  Medication Instructions:  Wean Ranexa to 1/2 tablet twice a day for 2 weeks , if you do not have increase anginal /chest discomfort  - stop taking medication - if  Symptoms return continue taking regular dose of Ranexa. *If you need a refill on your cardiac medications before your next appointment, please call your pharmacy*   Lab Work: Not needed  Testing/Procedures:  not needed   Follow-Up: At [facility], you and your health needs are our priority.  As part of our continuing mission to provide you with exceptional heart care, we have created designated Provider Care Teams.  These Care Teams include your primary Cardiologist (physician) and Advanced Practice Providers (APPs -  Physician Assistants and Nurse Practitioners) who all work together to provide you with the care you need, when you  need it.  We recommend signing up for the patient portal called "MyChart".  Sign up information is provided on this After Visit Summary.  MyChart is used to connect with patients for Virtual Visits (Telemedicine).  Patients are able to view lab/test results, encounter notes, upcoming appointments, etc.  Non-urgent messages can be sent to your provider as well.   To learn more about what you can do with MyChart, go to [email or web address].    Your next appointment:   6 month(s)  The format for your next appointment:   In Person  Provider:   [name], MD      Studies Ordered:   Orders Placed This Encounter  Procedures  . EKG 12-Lead     [name], M.D., M.S. Interventional Cardiologist   Pager # [phone] Phone # [phone] [address] [place], [place] [address]    Thank you for choosing [facility] at [facility]!!

## 2019-06-19 ENCOUNTER — Encounter: Payer: Self-pay | Admitting: Cardiology

## 2019-06-19 NOTE — Assessment & Plan Note (Signed)
He is on stable dose of rosuvastatin.  Lipids were quite at goal as of last year, but we will increase his dose.  Unfortunately do not have his full lipids for this year.  We will need to get his labs from his PCP.  Did not show up on the K PN.

## 2019-06-19 NOTE — Assessment & Plan Note (Signed)
He now has multiple overlapping DES stents in the RCA as well as mid LAD stent and moderate lesions elsewhere. No further angina.  Plan:   He is on amlodipine which she has been able to tolerate well for antianginal benefit.    Not able to tolerate beta-blocker because of bradycardia and fatigue.  We started ARB, will have him change it to taking it later in the day.  This way is not overlapping with amlodipine.  We started Ranexa prior to PCI, he is hoping that he can come off of this.  We will try to have him wean to 1/2 tablet twice daily for 2 weeks and then if symptoms are no longer present, he can simply stop.  If symptoms were to recur, he will restart.  Did not tolerate Imdur.   On modest dose of rosuvastatin.  Continue aspirin Plavix for 6 months, will then discontinue aspirin and then continue lifelong Plavix.    As of September 2021, okay to hold Plavix 5 to 7 days preop for procedures.

## 2019-06-19 NOTE — Assessment & Plan Note (Signed)
BP stable on amlodipine & ARB - plan to split dose interval - once in AM & other PM.  No Beta Blocker - bradycardia & fatigue

## 2019-06-19 NOTE — Assessment & Plan Note (Signed)
Unable to tolerate beta-blocker.  No change in symptoms having tried statin holiday.

## 2019-06-19 NOTE — Assessment & Plan Note (Signed)
Resolved following PCI of RCA -- following remaining moderate LCA disease.   Will attempt to wean off Ranexa

## 2019-07-22 ENCOUNTER — Other Ambulatory Visit: Payer: Self-pay | Admitting: Cardiology

## 2019-10-15 DIAGNOSIS — K219 Gastro-esophageal reflux disease without esophagitis: Secondary | ICD-10-CM | POA: Diagnosis not present

## 2019-10-15 DIAGNOSIS — Z79899 Other long term (current) drug therapy: Secondary | ICD-10-CM | POA: Diagnosis not present

## 2019-10-15 DIAGNOSIS — J310 Chronic rhinitis: Secondary | ICD-10-CM | POA: Diagnosis not present

## 2019-10-15 DIAGNOSIS — I2510 Atherosclerotic heart disease of native coronary artery without angina pectoris: Secondary | ICD-10-CM | POA: Diagnosis not present

## 2019-10-15 DIAGNOSIS — R5383 Other fatigue: Secondary | ICD-10-CM | POA: Diagnosis not present

## 2019-10-15 DIAGNOSIS — Z8639 Personal history of other endocrine, nutritional and metabolic disease: Secondary | ICD-10-CM | POA: Diagnosis not present

## 2019-10-15 DIAGNOSIS — I10 Essential (primary) hypertension: Secondary | ICD-10-CM | POA: Diagnosis not present

## 2019-10-15 DIAGNOSIS — E782 Mixed hyperlipidemia: Secondary | ICD-10-CM | POA: Diagnosis not present

## 2019-10-20 DIAGNOSIS — G4733 Obstructive sleep apnea (adult) (pediatric): Secondary | ICD-10-CM | POA: Diagnosis not present

## 2019-11-18 ENCOUNTER — Other Ambulatory Visit: Payer: Self-pay | Admitting: Cardiology

## 2019-11-18 NOTE — Telephone Encounter (Signed)
Rx has been sent to the pharmacy electronically. ° °

## 2019-12-16 ENCOUNTER — Ambulatory Visit (INDEPENDENT_AMBULATORY_CARE_PROVIDER_SITE_OTHER): Payer: Federal, State, Local not specified - PPO | Admitting: Cardiology

## 2019-12-16 ENCOUNTER — Other Ambulatory Visit: Payer: Self-pay

## 2019-12-16 ENCOUNTER — Encounter: Payer: Self-pay | Admitting: Cardiology

## 2019-12-16 DIAGNOSIS — Z9861 Coronary angioplasty status: Secondary | ICD-10-CM

## 2019-12-16 DIAGNOSIS — E785 Hyperlipidemia, unspecified: Secondary | ICD-10-CM | POA: Diagnosis not present

## 2019-12-16 DIAGNOSIS — I2089 Other forms of angina pectoris: Secondary | ICD-10-CM

## 2019-12-16 DIAGNOSIS — I2510 Atherosclerotic heart disease of native coronary artery without angina pectoris: Secondary | ICD-10-CM

## 2019-12-16 DIAGNOSIS — R5383 Other fatigue: Secondary | ICD-10-CM

## 2019-12-16 DIAGNOSIS — I208 Other forms of angina pectoris: Secondary | ICD-10-CM | POA: Diagnosis not present

## 2019-12-16 DIAGNOSIS — I10 Essential (primary) hypertension: Secondary | ICD-10-CM

## 2019-12-16 NOTE — Progress Notes (Signed)
Primary Care Provider: Philemon Kingdom, MD Cardiologist: [name], MD Electrophysiologist: None  Clinic Note: Chief Complaint  Patient presents with  . Follow-up    72-month  . Coronary Artery Disease    No angina since PCI.  Also notes energy improved.   HPI:    [name] is a 64 y.o. male with a PMH notable for prior STEMI with CAD-PCI who presents today for evaluation of exertional dyspnea with palpitations.  CADHistory:   Inferior STEMI MI -> PCI to the RCA in 2013.   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 Also had D1 and circumflex disease evaluated with FFR --> nonsignificant. Medical therapy.  Crescendo Angina March 2021: (Myoview in February 2020 showed inferior ischemia -> cath delayed due to COVID-19 restrictions) ->   (06/02/2019), CATH: moderate lesions in the LCA => all negative by DFR, extensive mid-distal RCA disease, calcified prior to previous stent--  (06/05/2019), STAGED CSI DES PCI 2 overlapping stents that overlap the distal stent. ->  Reviewed below  [name] was last seen on March 16th for post PCI follow-up.  Recent Hospitalizations: None  Reviewed  CV studies:    The following studies were reviewed today: (if available, images/films reviewed: From Epic Chart or Care Everywhere)  No new  Interval History:   [name] presents today for his second post PCI evaluation doing pretty well.  About the only thing he notes that his hands have been hurting him a lot from arthritis.  He tells me his energy level is notably improved.  He has not had any issues with the fatigue and dyspnea that he had prior to his PCI.  No chest pain or pressure.  No heart failure symptoms or arrhythmia symptoms.  He says he is starting to gradually get back into shape doing more walking and exercise.  He does not really like the heat, but is trying to the best he can.  CV  Review of Symptoms (Summary): positive for - Rare skipped beats but nothing lasting more than a few seconds negative for - chest pain, dyspnea on exertion, edema, orthopnea, palpitations, paroxysmal nocturnal dyspnea, rapid heart rate, shortness of breath or Lightheadedness, dizziness or syncope/near syncope, TIA/amaurosis fugax, claudication.  The patient does not have symptoms concerning for COVID-19 infection (fever, chills, cough, or new shortness of breath).  The patient is practicing social distancing.  REVIEWED OF SYSTEMS   Review of Systems  Constitutional: Negative for malaise/fatigue (Notable energy level improvement after PCI.) and weight loss.  HENT: Negative for nosebleeds.   Respiratory: Negative for shortness of breath and wheezing.   Gastrointestinal: Negative for blood in stool and melena.  Genitourinary: Negative for hematuria.  Musculoskeletal: Positive for joint pain (Hands get very stiff and tight.  Worse in the cold and raining.) and myalgias (He still has some cramping and muscle tightness, but not significant.).  Neurological: Negative for dizziness (Rare), focal weakness, weakness and headaches.  Psychiatric/Behavioral: Negative for memory loss. The patient is nervous/anxious and has insomnia (Still has difficulty falling asleep.).    I have reviewed and (if needed) personally updated the patient's problem list, medications, allergies, past medical and surgical history, social and family history.   PAST MEDICAL HISTORY   Past Medical History:  Diagnosis Date  . Anxiety   . Arthritis    "mostly in my hands; sometimes other areas" (10/20/2013)  . Bipolar disorder (HCC)   .  BPH (benign prostatic hyperplasia)   . CAD S/P percutaneous coronary angioplasty 3/'13, 7/'15; 3/'21   (Therapeutic P2Y12 on Plavix); a) STEMI -m RCA DES PCI; 2015: Class III angina (abnl MYOVIEW w/ Ant-AntLat ischemia) -> m-d LAD 95-99% (PCI: Biotronik AG 2 DES 2.5 x 13, 2.75 mm); b. 3/'21  Cath/Staged PCI: LM nl, mLAD 60% (DFR .96), D1 65%  (DFR .95), mCx 60% (DFR .96); pRCA 60% - m-d 85&70% (CSI -> 3.5x38 Resolute DES p-m, 3.0x8 Resolute DES m-d connecting new & old  STENTS). EF 55-65%.  . Depression   . Dizziness of unknown cause January 2011   abn MRI, Neuro w/u Jan 2011  . Dyslipidemia, goal LDL below 70   . Essential hypertension   . GERD (gastroesophageal reflux disease)   . RA (rheumatoid arthritis) (HCC)   . Rheumatic fever 1969  . ST elevation myocardial infarction (STEMI) involving right coronary artery in recovery phase (HCC) 06/2011   100% occluded RCA; PCI - 3.0x24 Promus DES (3.5-3.6 mm) ; Mod w moderate LAD & Cx disease, EF 55-60% w/ basal inferoseptal HK confirmed by echo    PAST SURGICAL HISTORY   Past Surgical History:  Procedure Laterality Date  . CORONARY ATHERECTOMY N/A 06/05/2019   Procedure: CORONARY ATHERECTOMY;  Surgeon: Marykay Lex, MD;  Location[facility] INVASIVE CV LAB;  Service: Cardiovascular; orbital atherectomy-mid RCA  . CORONARY STENT INTERVENTION N/A 06/05/2019   Procedure: CORONARY STENT INTERVENTION;  Surgeon: Marykay Lex, MD;  Location: [facility] INVASIVE CV LAB;  Service: Cardiovascular;; pRCA 60% - m-d 85&70% (CSI -> 3.5x38 Resolute DES p-m, 3.0x8 Resolute DES m-d connecting new & old  STENTS).  Underestimated eccentric lesion in the bend making it difficult to connect the new stent to the prior stent (was thought to be stent - but was calcification  . INTRAVASCULAR PRESSURE WIRE/FFR STUDY N/A 06/02/2019   Procedure: INTRAVASCULAR PRESSURE WIRE/FFR STUDY;  Surgeon: Marykay Lex, MD;  Location: [facility] INVASIVE CV LAB;  Service: Cardiovascular;  LCA: mLAD 60% (DFR .96), D1 65%  (DFR .95), mCx 60% (DFR .96)  . LEFT HEART CATH AND CORONARY ANGIOGRAPHY N/A 06/02/2019   Procedure: LEFT HEART CATH AND CORONARY ANGIOGRAPHY;  Surgeon: Marykay Lex, MD;  Location: [facility] INVASIVE CV LAB;  Service: Cardiovascular;;; LM - nml; mLAD 60% (DFR .96), D1 65%  (DFR  .95), mCx 60% (DFR .96); mRCA 60%, 85%&70% (staged CSI PCI)  . LEFT HEART CATHETERIZATION WITH CORONARY ANGIOGRAM N/A 06/28/2011   Procedure: LEFT HEART CATHETERIZATION WITH CORONARY ANGIOGRAM;  Surgeon: Marykay Lex, MD;  Location: [facility] CATH LAB;  50% LAD after D1, D1 30%.  Small D2.  Large caliber LCx with (small OM1) major OM 2 and 3 followed by ABG circumflex with 40% focal lesion terminating with LPL 1.  100% mid RCA (DES PCI); beyond 100% RCA  - large PDA (double barrel) and small PAV-PL -EF 55 to 60% w/ midInf-LAf HK.  Marland Kitchen LEFT HEART CATHETERIZATION WITH CORONARY ANGIOGRAM N/A 10/20/2013   Procedure: LEFT HEART CATHETERIZATION WITH CORONARY ANGIOGRAM;  Surgeon: Marykay Lex, MD;  Location: [facility] CATH LAB;  Service: Cardiovascular;  Laterality: N/A;  . NM MYOVIEW LTD N/A 10/19/2013   INTERMEDIATE RISK, septal, anterior-anterolateral ischemia --> referred for cath the following day -- LAD LESION TREATED WITH DES PCI  . NM MYOVIEW LTD  05/23/2018   EF estimated 45 to 50%.  Hypertensive response to exercise.  Medium sized moderate severity defect in the basal inferior mid inferolateral wall (read as  intermediate risk) -> the defect actually appears to improve on stress imaging suggesting hibernating myocardium.  Marland Kitchen PERCUTANEOUS CORONARY STENT INTERVENTION (PCI-S) N/A 06/2011; 09/2013   a) 3/'13: 100%  RCA ->PCI 3.20x2 (3.6) Promus DES stent; 7/'15: mLAD PCI Biotronik AG DES 2.5 x13 2.75 mm), FFR D1 50-60% = 0.86 (small branch had ~70%), mCx ~50-60% FFR=1.0.  . PLANTAR FASCIA SURGERY Left 2004   "& neuroma"  . SHOULDER ARTHROSCOPY W/ ROTATOR CUFF REPAIR Left July 2011  . TEMPORARY PACEMAKER N/A 06/05/2019   Procedure: TEMPORARY PACEMAKER;  Surgeon: Marykay Lex, MD;  Location: [facility] INVASIVE CV LAB;  Service: Cardiovascular;  Laterality: N/A;  . TRANSTHORACIC ECHOCARDIOGRAM N/A 05/2016   EF ~55%. Normal LV Size & function. NO RWMA (inferior HK no longer present). Mlid LVH - Gr 1 DD.. Mild LA dilation.     . Cardiac Cath 06/02/2019: Severe RCA disease with tandem 80 and 70% lesions, heavily calcified as likely culprit (prior to previous stent); Moderate (DFR negative) prox D1, mid LAD & mid LCx lesions with otherwise widely patent distal LAD stent; Normal LVEF and EDP.     06/05/2019 STAGED ATHERECTOMY PCI RCA: Resolute Onyx 3.0 mm 8 mm overlapping most distal stent followed by 3.5 mm x 38 mm proximal covering up to the 60% lesion. ->  Very difficult to make the turn to place connecting overlapping stent => There apparently was a lesion in the bend.  This required high pressure post dilation.  TPM wire placed      MEDICATIONS/ALLERGIES   Current Meds  Medication Sig  . acetaminophen (TYLENOL) 500 MG tablet Take 500-1,000 mg by mouth every 6 (six) hours as needed (for pain.).  Marland Kitchen amLODipine (NORVASC) 10 MG tablet TAKE 1 TABLET BY MOUTH DAILY  . azelastine (ASTELIN) 0.1 % nasal spray Place 1 spray into both nostrils 2 (two) times daily as needed for rhinitis or allergies. Use in each nostril as directed  . clopidogrel (PLAVIX) 75 MG tablet Take 1 tablet (75 mg total) by mouth daily.  . Coenzyme Q10 (CO Q-10) 300 MG CAPS Take 300 mg by mouth daily.  . finasteride (PROSCAR) 5 MG tablet Take 5 mg by mouth daily.  . fluticasone (FLONASE) 50 MCG/ACT nasal spray Place 1 spray into the nose daily as needed for allergies. For allergies    . losartan (COZAAR) 25 MG tablet TAKE 1 TABLET BY MOUTH DAILY (Patient taking differently: Take 25 mg by mouth daily. )  . Melatonin 10 MG CAPS Take 10 mg by mouth at bedtime as needed (sleep).  . Misc Natural Products (TURMERIC CURCUMIN) CAPS Take 1 capsule by mouth daily.  . nitroGLYCERIN (NITROSTAT) 0.4 MG SL tablet Place 1 tablet (0.4 mg total) under the tongue every 5 (five) minutes as needed for chest pain.  Marland Kitchen omeprazole (PRILOSEC) 40 MG capsule Take 40 mg by mouth daily before breakfast.  . rosuvastatin (CRESTOR) 20 MG tablet TAKE 1 TABLET BY MOUTH DAILY  .  [DISCONTINUED] aspirin EC 81 MG tablet Take 81 mg by mouth daily.  . [DISCONTINUED] ranolazine (RANEXA) 500 MG 12 hr tablet Take 250 mg by mouth daily. 1/2 tablet  -> Written and Ranexa discontinued today  No Known Allergies  SOCIAL HISTORY/FAMILY HISTORY   Reviewed in Epic:  Pertinent findings: None  OBJCTIVE -PE, EKG, labs   Wt Readings from Last 3 Encounters:  12/16/19 194 lb (88 kg)  06/16/19 194 lb 6.4 oz (88.2 kg)  06/06/19 189 lb (85.7 kg)    Physical  Exam: BP 136/86   Pulse 69   Ht  (1.778 m)   Wt 194 lb (88 kg)   SpO2 97%   BMI 27.84 kg/m  Physical Exam Vitals reviewed.  Constitutional:      General: He is not in acute distress.    Appearance: Normal appearance. He is well-developed and normal weight. He is not ill-appearing.     Comments: Healthy-appearing.  Well-groomed.  HENT:     Head: Normocephalic and atraumatic.  Neck:     Vascular: No carotid bruit, hepatojugular reflux or JVD.  Cardiovascular:     Rate and Rhythm: Normal rate and regular rhythm.  No extrasystoles are present.    Chest Wall: PMI is not displaced.     Pulses: Normal pulses and intact distal pulses.     Heart sounds: Normal heart sounds. No murmur heard.  No friction rub. No gallop.   Pulmonary:     Effort: Pulmonary effort is normal. No respiratory distress.     Breath sounds: Normal breath sounds. No wheezing or rales.  Musculoskeletal:        General: No swelling. Normal range of motion.     Cervical back: Normal range of motion and neck supple.     Comments: Both radial and groin cath sites are intact.  Is mild bruising in the right inguinal area.  No hematoma.  Neurological:     General: No focal deficit present.     Mental Status: He is alert and oriented to person, place, and time.  Psychiatric:        Mood and Affect: Mood normal.        Behavior: Behavior normal.        Thought Content: Thought content normal.        Judgment: Judgment normal.     Adult ECG  Report N/a  Recent Labs: Labs checked by PCP (again LDl not recorded on [facility])  6/57/8469: TC 141, HDL 40, TG 211; A1c 5.6.  Hgb 14.6. Cr 0.4, K+ 3.8  2/15/ 2021 not completely available.  TC 150 was similar to the previous level.  Suspect that his labs are pretty much similar.  Unfortunate LDL levels not reported on [facility]. Lab Results  Component Value Date   CHOL 155 04/15/2018   HDL 40 04/15/2018   LDLCALC 86 04/15/2018   TRIG 146 04/15/2018   CHOLHDL 3.9 04/15/2018   Lab Results  Component Value Date   CREATININE 0.92 06/06/2019   BUN 13 06/06/2019   NA 139 06/06/2019   K 3.8 06/06/2019   CL 104 06/06/2019   CO2 27 06/06/2019   Lab Results  Component Value Date   TSH 0.841 10/16/2013    ASSESSMENT/PLAN    Problem List Items Addressed This Visit    Stable angina (HCC) atypical symptoms manifested as exertional dyspnea, fatigue and sweating (Chronic)    Class III symptoms now resolved.  Currently no angina with moderate left coronary disease.  Plan: Continue amlodipine as he is not able to tolerate beta-blockers, but we can discontinue Ranexa.      Essential hypertension (Chronic)    Borderline blood pressure today on amlodipine and losartan low-dose.  I have asked that he can monitor his pressures at home.  If they do tending increased to where this is the average as opposed to the upper limit on his pressures, I would probably increase losartan to 50 mg      CAD S/P PCI to RCA (2013, 2021) and LAD (  2015); therapeutic P2Y12 for Plavix (Chronic)    Significant overlapping DES stents in the RCA as well as his LAD with moderate disease elsewhere.  No further anginal symptoms.  He is now 6 months out from his PCI and doing very well.  Plan:  Continue essentially LIFELONG CLOPIDOGREL, but okay to interrupt--  okay to hold 5-7 days preop for surgeries or procedures.  Discontinue aspirin and Ranexa  Continue amlodipine max dose.  No beta-blocker because of fatigue  and bradycardia.  Continue ARB.      Fatigue due to treatment (Chronic)    Notable improvement with discontinuation of beta-blocker.      Dyslipidemia, goal LDL below 70 (Chronic)    He is on stable dose of rosuvastatin.  Unfortunately I am not able to see his LDL levels.  He is total cholesterol level is lower now than it had been earlier this year.  I can only imagine that his LDL level is probably closer to 70.  Target level is 70.  We will try to get labs from Dr. Sudie Bailey -> may need to consider additional therapy beyond simply rosuvastatin versus increasing to 40 mg.        COVID-19 Education: The signs and symptoms of COVID-19 were discussed with the patient and how to seek care for testing (follow up with PCP or arrange E-visit).   The importance of social distancing was discussed today.  I spent a total of 24 minutes with the patient. >  50% of the time was spent in direct patient consultation.  Additional time spent with chart review  / charting (studies, outside notes, etc): 8 Total Time:   Current medicines are reviewed at length with the patient today.  (+/- concerns) n/a   Patient Instructions / Medication Changes & Studies & Tests Ordered   Patient Instructions  Medication Instructions:  Stop taking Ranexa, Aspirin  Monitor  Blood pressure - if  elevated contact office *If you need a refill on your cardiac medications before your next appointment, please call your pharmacy*   Lab Work: Not needed If you have labs (blood work) drawn today and your tests are completely normal, you will receive your results only by: Marland Kitchen MyChart Message (if you have MyChart) OR . A paper copy in the mail If you have any lab test that is abnormal or we need to change your treatment, we will call you to review the results.   Testing/Procedures: Not needed   Follow-Up: At [facility], you and your health needs are our priority.  As part of our continuing mission to  provide you with exceptional heart care, we have created designated Provider Care Teams.  These Care Teams include your primary Cardiologist (physician) and Advanced Practice Providers (APPs -  Physician Assistants and Nurse Practitioners) who all work together to provide you with the care you need, when you need it.  We recommend signing up for the patient portal called "MyChart".  Sign up information is provided on this After Visit Summary.  MyChart is used to connect with patients for Virtual Visits (Telemedicine).  Patients are able to view lab/test results, encounter notes, upcoming appointments, etc.  Non-urgent messages can be sent to your provider as well.   To learn more about what you can do with MyChart, go to [email or web address].    Your next appointment:   6 month(s)  The format for your next appointment:   In Person  Provider:   [name], MD  Studies Ordered:  No orders of the defined types were placed in this encounter.    Glenetta Hew, M.D., M.S. Interventional Cardiologist   Pager # [phone] Phone # [phone] [address] [place], [place] [address]   Thank you for choosing Heartcare at [facility]!!

## 2019-12-16 NOTE — Patient Instructions (Addendum)
Medication Instructions:  Stop taking Ranexa, Aspirin  Monitor  Blood pressure - if  elevated contact office *If you need a refill on your cardiac medications before your next appointment, please call your pharmacy*   Lab Work: Not needed If you have labs (blood work) drawn today and your tests are completely normal, you will receive your results only by: Marland Kitchen MyChart Message (if you have MyChart) OR . A paper copy in the mail If you have any lab test that is abnormal or we need to change your treatment, we will call you to review the results.   Testing/Procedures: Not needed   Follow-Up: At [facility], you and your health needs are our priority.  As part of our continuing mission to provide you with exceptional heart care, we have created designated Provider Care Teams.  These Care Teams include your primary Cardiologist (physician) and Advanced Practice Providers (APPs -  Physician Assistants and Nurse Practitioners) who all work together to provide you with the care you need, when you need it.  We recommend signing up for the patient portal called "MyChart".  Sign up information is provided on this After Visit Summary.  MyChart is used to connect with patients for Virtual Visits (Telemedicine).  Patients are able to view lab/test results, encounter notes, upcoming appointments, etc.  Non-urgent messages can be sent to your provider as well.   To learn more about what you can do with MyChart, go to [email or web address].    Your next appointment:   6 month(s)  The format for your next appointment:   In Person  Provider:   [name], MD

## 2019-12-20 ENCOUNTER — Encounter: Payer: Self-pay | Admitting: Cardiology

## 2019-12-20 NOTE — Assessment & Plan Note (Signed)
Class III symptoms now resolved.  Currently no angina with moderate left coronary disease.  Plan: Continue amlodipine as he is not able to tolerate beta-blockers, but we can discontinue Ranexa.

## 2019-12-20 NOTE — Assessment & Plan Note (Signed)
Borderline blood pressure today on amlodipine and losartan low-dose.  I have asked that he can monitor his pressures at home.  If they do tending increased to where this is the average as opposed to the upper limit on his pressures, I would probably increase losartan to 50 mg

## 2019-12-20 NOTE — Assessment & Plan Note (Signed)
Notable improvement with discontinuation of beta-blocker.

## 2019-12-20 NOTE — Assessment & Plan Note (Signed)
Significant overlapping DES stents in the RCA as well as his LAD with moderate disease elsewhere.  No further anginal symptoms.  He is now 6 months out from his PCI and doing very well.  Plan:  Continue essentially LIFELONG CLOPIDOGREL, but okay to interrupt--  okay to hold 5-7 days preop for surgeries or procedures.  Discontinue aspirin and Ranexa  Continue amlodipine max dose.  No beta-blocker because of fatigue and bradycardia.  Continue ARB.

## 2019-12-20 NOTE — Assessment & Plan Note (Signed)
He is on stable dose of rosuvastatin.  Unfortunately I am not able to see his LDL levels.  He is total cholesterol level is lower now than it had been earlier this year.  I can only imagine that his LDL level is probably closer to 70.  Target level is 70.  We will try to get labs from Dr. Sudie Bailey -> may need to consider additional therapy beyond simply rosuvastatin versus increasing to 40 mg.

## 2020-01-21 ENCOUNTER — Other Ambulatory Visit: Payer: Self-pay | Admitting: Cardiology

## 2020-02-10 ENCOUNTER — Other Ambulatory Visit: Payer: Self-pay | Admitting: Cardiology

## 2020-03-28 ENCOUNTER — Other Ambulatory Visit: Payer: Self-pay | Admitting: Cardiology

## 2020-03-28 DIAGNOSIS — I10 Essential (primary) hypertension: Secondary | ICD-10-CM

## 2020-05-10 ENCOUNTER — Other Ambulatory Visit: Payer: Self-pay

## 2020-05-10 ENCOUNTER — Encounter: Payer: Self-pay | Admitting: Cardiology

## 2020-05-10 ENCOUNTER — Ambulatory Visit: Payer: Federal, State, Local not specified - PPO | Admitting: Cardiology

## 2020-05-10 VITALS — BP 146/92 | HR 60 | Ht 70.0 in | Wt 197.6 lb

## 2020-05-10 DIAGNOSIS — I10 Essential (primary) hypertension: Secondary | ICD-10-CM

## 2020-05-10 DIAGNOSIS — I2089 Other forms of angina pectoris: Secondary | ICD-10-CM

## 2020-05-10 DIAGNOSIS — I2119 ST elevation (STEMI) myocardial infarction involving other coronary artery of inferior wall: Secondary | ICD-10-CM

## 2020-05-10 DIAGNOSIS — R5383 Other fatigue: Secondary | ICD-10-CM

## 2020-05-10 DIAGNOSIS — I2510 Atherosclerotic heart disease of native coronary artery without angina pectoris: Secondary | ICD-10-CM

## 2020-05-10 DIAGNOSIS — I208 Other forms of angina pectoris: Secondary | ICD-10-CM

## 2020-05-10 DIAGNOSIS — Z955 Presence of coronary angioplasty implant and graft: Secondary | ICD-10-CM | POA: Diagnosis not present

## 2020-05-10 DIAGNOSIS — I25709 Atherosclerosis of coronary artery bypass graft(s), unspecified, with unspecified angina pectoris: Secondary | ICD-10-CM

## 2020-05-10 DIAGNOSIS — E785 Hyperlipidemia, unspecified: Secondary | ICD-10-CM

## 2020-05-10 DIAGNOSIS — Z9861 Coronary angioplasty status: Secondary | ICD-10-CM | POA: Diagnosis not present

## 2020-05-10 MED ORDER — VALSARTAN 80 MG PO TABS: 80.0000 mg | ORAL_TABLET | Freq: Every day | ORAL | 3 refills | Status: DC

## 2020-05-10 MED ORDER — NITROGLYCERIN 0.4 MG SL SUBL: 0.4000 mg | SUBLINGUAL_TABLET | SUBLINGUAL | 4 refills | Status: DC | PRN

## 2020-05-10 NOTE — Progress Notes (Signed)
Primary Care Provider: Philemon Kingdom, MD Cardiologist: [name], MD Electrophysiologist: None  Clinic Note: Chief Complaint  Patient presents with  . Follow-up    Doing well.  Is not very active.  . Coronary Artery Disease    No real angina.  Just not exercising much.  . Hypertension    BP still up.  ===================================  ASSESSMENT/PLAN   Problem List Items Addressed This Visit    Presence of DESin RCA - Promus DES 3.0 mm x 24 mm (post-dilated to 3.60mm) (Chronic)   Relevant Orders   EKG 12-Lead (Completed)   History of ST elevation myocardial infarction (STEMI) of inferior wall - Primary (Chronic)    Relatively old inferior STEMI with occluded RCA treated with DES stent.  He had staged PCI to the LAD.  No significantly reduced EF despite having basal inferoseptal HK.  Unfortunately, despite adjustment of medications and PCI entire RCA with atherectomy, he still just does not seem to have gotten back to his premorbid level of activity.  He is most likely dealing with some depression issues, and has never got back to his baseline.  No really active heart failure or angina symptoms, just not active      Relevant Medications   valsartan (DIOVAN) 80 MG tablet   nitroGLYCERIN (NITROSTAT) 0.4 MG SL tablet   Other Relevant Orders   EKG 12-Lead (Completed)   Stable angina (HCC) atypical symptoms manifested as exertional dyspnea, fatigue and sweating (Chronic)    His class III symptoms have resolved.  Now need be class I symptoms if he exerts himself, but is not doing so.  On amlodipine.  We did not use beta-blocker because of bradycardia and fatigue.      Relevant Medications   valsartan (DIOVAN) 80 MG tablet   nitroGLYCERIN (NITROSTAT) 0.4 MG SL tablet   Other Relevant Orders   EKG 12-Lead (Completed)   Presence of drug coated stent in LAD coronary artery: Biotronik AG DES 2.5 mm x 13 mm (2.75 mm) (Chronic)   Coronary artery disease involving  coronary bypass graft of native heart with angina pectoris (HCC) (Chronic)    Thankfully, he has not had any further angina after extensive PCI to the RCA.  Unable to tolerate beta-blocker because of bradycardia and fatigue/depression.  Plan: Continue rosuvastatin, amlodipine, and ARB (converting to valsartan midodrine)  Lifelong Plavix monotherapy.  Okay to interrupt for procedures.      Relevant Medications   valsartan (DIOVAN) 80 MG tablet   nitroGLYCERIN (NITROSTAT) 0.4 MG SL tablet   Essential hypertension (Chronic)    Blood pressure is high.  With a dry hacking cough I will switch him from losartan to valsartan just to change ARB.  There has been some cross-reactivity with losartan and ACE inhibitor's with dry cough.  Convert to valsartan 80 mg daily. => Reassess some roughly 6 weeks.      Relevant Medications   valsartan (DIOVAN) 80 MG tablet   nitroGLYCERIN (NITROSTAT) 0.4 MG SL tablet   CAD S/P PCI to RCA (2013, 2021) and LAD (2015); therapeutic P2Y12 for Plavix (Chronic)    Extensive PCI to the RCA now-very difficult lesion expansion in the crux of the RCA.  Almost a year out from his PCI no further angina.  Plan:  No longer on aspirin  LIFELONG CLOPIDOGREL, but able to interrupt  Okay to hold 5-7 days preop for surgeries or procedures.      Relevant Medications   valsartan (DIOVAN) 80 MG tablet  nitroGLYCERIN (NITROSTAT) 0.4 MG SL tablet   Other Relevant Orders   EKG 12-Lead (Completed)   Fatigue due to treatment (Chronic)    Did not tolerate beta-blocker.  He had bradycardia and fatigue, that has improved with discontinuing beta-blocker..      Dyslipidemia, goal LDL below 70 (Chronic)    Unstable moderate dose rosuvastatin. Unfortunately, still not able to see LDL level.  Labs are being followed by PCP.  Target LDL is at least < 70, if not <50.  Low threshold to consider more aggressive therapy.  If labs not checked by visit, I plan to recheck.       Relevant Medications   valsartan (DIOVAN) 80 MG tablet   nitroGLYCERIN (NITROSTAT) 0.4 MG SL tablet      ===================================  HPI:    [name] is a 65 y.o. male with a PMH notable for prior STEMI with CAD and PCI who presents today for 4 to 49-month follow-up.  CADHistory:   Inferior STEMI MI -> PCI to the RCA in 2013.   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 Also had D1 and circumflex disease evaluated with FFR --> nonsignificant. Medical therapy.  Crescendo Angina March 2021: (Myoview in February 2020 showed inferior ischemia -> cath delayed due to COVID-19 restrictions) ->  ? (06/02/2019), CATH: moderate lesions in the LCA => all negative by DFR, extensive mid-distal RCA disease, calcified prior to previous stent-- ? (06/05/2019), STAGED CSI DES PCI 2 overlapping stents that overlap the distal stent. ->  Reviewed below  Delvin L Solecki was last seen on 12/16/2019. Was doing well. Was having issues with arthritis but energy level was improving. No further fatigue since PCI. Gradually getting back in shape.  Noted off-and-on skipped beats and exertional dyspnea.   Aspirin and Ranexa stopped  Recent Hospitalizations: None  Reviewed  CV studies:    The following studies were reviewed today: (if available, images/films reviewed: From Epic Chart or Care Everywhere) . None:  Interval History:   Andreas L Manygoats returns today overall okay, but seems to be a little bit sheepish about talking.  He said that he is taking aspirin diltiazem because of an unusual chest discomfort and palpitations-described as a "thumping in the chest".  No chest pain.  He is very deconditioned, not doing much exercise.  He has had a nagging dry cough and back also some chest wall pain.  He just does not seem like he has a lot of energy.  Not much get up and go.  CV Review of Symptoms (Summary): positive for -  dyspnea on exertion, palpitations and Mild chest wall pain.  Grossly deconditioned.  Describes a thumping sensation in his chest negative for - chest pain, edema, orthopnea, paroxysmal nocturnal dyspnea, rapid heart rate, shortness of breath or Syncope/near syncope or TIA/amaurosis fugax.  Mild positional dizziness  The patient does not have symptoms concerning for COVID-19 infection (fever, chills, cough, or new shortness of breath).   REVIEWED OF SYSTEMS   Review of Systems  Constitutional: Positive for malaise/fatigue (Just does not have a lot of energy.  Lack of "get up and go."). Negative for chills and fever.  HENT: Negative for congestion.   Respiratory: Positive for cough (Chronic dry/hacking cough). Negative for shortness of breath and wheezing.   Cardiovascular: Negative for claudication and leg swelling.  Musculoskeletal: Positive for joint pain (Still has his hands get stiff.  Cramping type pain  worsened with cold.). Negative for falls.  Neurological: Positive for tingling (He says occasionally he is arms and legs will get numb). Negative for focal weakness and weakness.  Psychiatric/Behavioral: Negative for depression (Not really depression, but he has a bit nausea.  He is not exercising, and he just seems down.) and memory loss. The patient is nervous/anxious and has insomnia (Has difficulty falling asleep).    I have reviewed and (if needed) personally updated the patient's problem list, medications, allergies, past medical and surgical history, social and family history.   PAST MEDICAL HISTORY   Past Medical History:  Diagnosis Date  . Anxiety   . Arthritis    "mostly in my hands; sometimes other areas" (10/20/2013)  . Bipolar disorder (HCC)   . BPH (benign prostatic hyperplasia)   . CAD S/P percutaneous coronary angioplasty 3/'13, 7/'15; 3/'21   (Therapeutic P2Y12 on Plavix); a) STEMI -m RCA DES PCI; 2015: Class III angina (abnl MYOVIEW w/ Ant-AntLat ischemia) -> m-d LAD  95-99% (PCI: Biotronik AG 2 DES 2.5 x 13, 2.75 mm); b. 3/'21 Cath/Staged PCI: LM nl, mLAD 60% (DFR .96), D1 65%  (DFR .95), mCx 60% (DFR .96); pRCA 60% - m-d 85&70% (CSI -> 3.5x38 Resolute DES p-m, 3.0x8 Resolute DES m-d connecting new & old  STENTS). EF 55-65%.  . Depression   . Dizziness of unknown cause January 2011   abn MRI, Neuro w/u Jan 2011  . Dyslipidemia, goal LDL below 70   . Essential hypertension   . GERD (gastroesophageal reflux disease)   . RA (rheumatoid arthritis) (HCC)   . Rheumatic fever 1969  . ST elevation myocardial infarction (STEMI) involving right coronary artery in recovery phase (HCC) 06/2011   100% occluded RCA; PCI - 3.0x24 Promus DES (3.5-3.6 mm) ; Mod w moderate LAD & Cx disease, EF 55-60% w/ basal inferoseptal HK confirmed by echo    PAST SURGICAL HISTORY   Past Surgical History:  Procedure Laterality Date  . CORONARY ATHERECTOMY N/A 06/05/2019   Procedure: CORONARY ATHERECTOMY;  Surgeon: Marykay Lex, MD;  Location: [facility] INVASIVE CV LAB;  Service: Cardiovascular; orbital atherectomy-mid RCA  . CORONARY STENT INTERVENTION N/A 06/05/2019   Procedure: CORONARY STENT INTERVENTION;  Surgeon: Marykay Lex, MD;  Location: [facility] INVASIVE CV LAB;  Service: Cardiovascular;; pRCA 60% - m-d 85&70% (CSI -> 3.5x38 Resolute DES p-m, 3.0x8 Resolute DES m-d connecting new & old  STENTS).  Underestimated eccentric lesion in the bend making it difficult to connect the new stent to the prior stent (was thought to be stent - but was calcification  . INTRAVASCULAR PRESSURE WIRE/FFR STUDY N/A 06/02/2019   Procedure: INTRAVASCULAR PRESSURE WIRE/FFR STUDY;  Surgeon: Marykay Lex, MD;  Location: [facility] INVASIVE CV LAB;  Service: Cardiovascular;  LCA: mLAD 60% (DFR .96), D1 65%  (DFR .95), mCx 60% (DFR .96)  . LEFT HEART CATH AND CORONARY ANGIOGRAPHY N/A 06/02/2019   Procedure: LEFT HEART CATH AND CORONARY ANGIOGRAPHY;  Surgeon: Marykay Lex, MD;  Location: [facility] INVASIVE CV LAB;  Service:  Cardiovascular;;; LM - nml; mLAD 60% (DFR .96), D1 65%  (DFR .95), mCx 60% (DFR .96); mRCA 60%, 85%&70% (staged CSI PCI)  . LEFT HEART CATHETERIZATION WITH CORONARY ANGIOGRAM N/A 06/28/2011   Procedure: LEFT HEART CATHETERIZATION WITH CORONARY ANGIOGRAM;  Surgeon: Marykay Lex, MD;  Location: [facility] CATH LAB;  50% LAD after D1, D1 30%.  Small D2.  Large caliber LCx with (small OM1) major OM 2 and 3 followed by ABG circumflex  with 40% focal lesion terminating with LPL 1.  100% mid RCA (DES PCI); beyond 100% RCA  - large PDA (double barrel) and small PAV-PL -EF 55 to 60% w/ midInf-LAf HK.  Marland Kitchen LEFT HEART CATHETERIZATION WITH CORONARY ANGIOGRAM N/A 10/20/2013   Procedure: LEFT HEART CATHETERIZATION WITH CORONARY ANGIOGRAM;  Surgeon: Marykay Lex, MD;  Location: [facility] CATH LAB;  Service: Cardiovascular;  Laterality: N/A;  . NM MYOVIEW LTD N/A 10/19/2013   INTERMEDIATE RISK, septal, anterior-anterolateral ischemia --> referred for cath the following day -- LAD LESION TREATED WITH DES PCI  . NM MYOVIEW LTD  05/23/2018   EF estimated 45 to 50%.  Hypertensive response to exercise.  Medium sized moderate severity defect in the basal inferior mid inferolateral wall (read as intermediate risk) -> the defect actually appears to improve on stress imaging suggesting hibernating myocardium.  Marland Kitchen PERCUTANEOUS CORONARY STENT INTERVENTION (PCI-S) N/A 06/2011; 09/2013   a) 3/'13: 100%  RCA ->PCI 3.20x2 (3.6) Promus DES stent; 7/'15: mLAD PCI Biotronik AG DES 2.5 x13 2.75 mm), FFR D1 50-60% = 0.86 (small branch had ~70%), mCx ~50-60% FFR=1.0.  . PLANTAR FASCIA SURGERY Left 2004   "& neuroma"  . SHOULDER ARTHROSCOPY W/ ROTATOR CUFF REPAIR Left July 2011  . TEMPORARY PACEMAKER N/A 06/05/2019   Procedure: TEMPORARY PACEMAKER;  Surgeon: Marykay Lex, MD;  Location: [facility] INVASIVE CV LAB;  Service: Cardiovascular;  Laterality: N/A;  . TRANSTHORACIC ECHOCARDIOGRAM N/A 05/2016   EF ~55%. Normal LV Size & function. NO RWMA (inferior HK  no longer present). Mlid LVH - Gr 1 DD.. Mild LA dilation.     Cardiac Cath 06/02/2019: Severe RCA disease with tandem 80 and 70% lesions, heavily calcified as likely culprit (prior to previous stent); Moderate (DFR negative) prox D1, mid LAD & mid LCx lesions with otherwise widely patent distal LAD stent; Normal LVEF and EDP.      06/05/2019 STAGED ATHERECTOMY PCI RCA: Resolute Onyx 3.0 mm 8 mm overlapping most distal stent followed by 3.5 mm x 38 mm proximal covering up to the 60% lesion. -Difficult to overlap the distal stent because of unappreciated lesion in the crux of the RCA      There is no immunization history on file for this patient.  MEDICATIONS/ALLERGIES   Current Meds  Medication Sig  . acetaminophen (TYLENOL) 500 MG tablet Take 500-1,000 mg by mouth every 6 (six) hours as needed (for pain.).  Marland Kitchen amLODipine (NORVASC) 10 MG tablet TAKE 1 TABLET BY MOUTH DAILY  . azelastine (ASTELIN) 0.1 % nasal spray Place 1 spray into both nostrils 2 (two) times daily as needed for rhinitis or allergies. Use in each nostril as directed  . clopidogrel (PLAVIX) 75 MG tablet Take 1 tablet (75 mg total) by mouth daily.  . Coenzyme Q10 (CO Q-10) 300 MG CAPS Take 300 mg by mouth daily.  . eszopiclone (LUNESTA) 1 MG TABS tablet Take 1 mg by mouth at bedtime.  . finasteride (PROSCAR) 5 MG tablet Take 5 mg by mouth daily.  . fluticasone (FLONASE) 50 MCG/ACT nasal spray Place 1 spray into the nose daily as needed for allergies. For allergies  . Misc Natural Products (TURMERIC CURCUMIN) CAPS Take 1 capsule by mouth daily.  . pantoprazole (PROTONIX) 40 MG tablet Take 40 mg by mouth daily.  . rosuvastatin (CRESTOR) 20 MG tablet TAKE 1 TABLET BY MOUTH DAILY  . valsartan (DIOVAN) 80 MG tablet Take 1 tablet (80 mg total) by mouth daily.  . [DISCONTINUED] losartan (COZAAR) 25  MG tablet TAKE 1 TABLET BY MOUTH DAILY  . [DISCONTINUED] nitroGLYCERIN (NITROSTAT) 0.4 MG SL tablet Place 1 tablet (0.4 mg  total) under the tongue every 5 (five) minutes as needed for chest pain.    No Known Allergies  SOCIAL HISTORY/FAMILY HISTORY   Reviewed in Epic:  Pertinent findings:  Social History   Tobacco Use  . Smoking status: Never Smoker  . Smokeless tobacco: Never Used  Substance Use Topics  . Alcohol use: No  . Drug use: No   Social History   Social History Narrative   Married father of 2. Never smoked. Does not drink alcohol significantly.   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   He does play golf on a fairly regular basis, but is currently not walking between holes, but riding. He is starting to play with a new partner who likes to walk, so he is hoping to get more walking.    OBJCTIVE -PE, EKG, labs   Wt Readings from Last 3 Encounters:  05/10/20 197 lb 9.6 oz (89.6 kg)  12/16/19 194 lb (88 kg)  06/16/19 194 lb 6.4 oz (88.2 kg)   Physical Exam: BP (!) 146/92   Pulse 60   Ht 5\' 10"  (1.778 m)   Wt 197 lb 9.6 oz (89.6 kg)   BMI 28.35 kg/m  Physical Exam Vitals reviewed.  Constitutional:      General: He is not in acute distress.    Appearance: Normal appearance. He is obese. He is not ill-appearing or toxic-appearing.     Comments: Healthy-appearing.  Well-groomed.  Seems a little bit down.  HENT:     Head: Normocephalic and atraumatic.  Neck:     Vascular: No carotid bruit, hepatojugular reflux or JVD.  Cardiovascular:     Rate and Rhythm: Normal rate and regular rhythm.  No extrasystoles are present.    Chest Wall: PMI is not displaced.     Pulses: Normal pulses and intact distal pulses.     Heart sounds: Normal heart sounds. No murmur heard. No friction rub. No gallop.   Musculoskeletal:     Cervical back: Normal range of motion and neck supple.  Neurological:     Mental Status: He is alert.     Adult ECG Report  Rate: 60 ;  Rhythm: normal sinus rhythm  and 1  AVB.  Otherwise normal axis, intervals and durations;   Narrative Interpretation: Stable  Recent Labs:   10/15/2019: TC 141, TG 211, HDL 40 (LDL not reported).  A1c 5.6.  Hgb 14.6,Cr 0.4  Lab Results  Component Value Date   CHOL 155 04/15/2018   HDL 40 04/15/2018   LDLCALC 86 04/15/2018   TRIG 146 04/15/2018   CHOLHDL 3.9 04/15/2018   Lab Results  Component Value Date   CREATININE 0.92 06/06/2019   BUN 13 06/06/2019   NA 139 06/06/2019   K 3.8 06/06/2019   CL 104 06/06/2019   CO2 27 06/06/2019   CBC Latest Ref Rng & Units 06/06/2019 06/05/2019 05/20/2019  WBC 4.0 - 10.5 K/uL 6.1 5.5 6.0  Hemoglobin 13.0 - 17.0 g/dL 02.7 25.3 66.4  Hematocrit 39.0 - 52.0 % 38.0(L) 44.1 42.0  Platelets 150 - 400 K/uL 154 157 173    Lab Results  Component Value Date   TSH 0.841 10/16/2013    ==================================================  COVID-19 Education: The signs and symptoms  of COVID-19 were discussed with the patient and how to seek care for testing (follow up with PCP or arrange E-visit).   The importance of social distancing and COVID-19 vaccination was discussed today. The patient is practicing social distancing & Masking.   I spent a total of 25 minutes with the patient spent in direct patient consultation.  Additional time spent with chart review  / charting (studies, outside notes, etc): 12 min Total Time: 37 min   Current medicines are reviewed at length with the patient today.  (+/- concerns) n/a  This visit occurred during the SARS-CoV-2 public health emergency.  Safety protocols were in place, including screening questions prior to the visit, additional usage of staff PPE, and extensive cleaning of exam room while observing appropriate contact time as indicated for disinfecting solutions.  Notice: This dictation was prepared with Dragon dictation along with smaller phrase technology. Any transcriptional errors that result from this process are unintentional and  may not be corrected upon review.  Patient Instructions / Medication Changes & Studies & Tests Ordered   Patient Instructions  Medication Instructions:  Stop taking Losartan  Start taking Valsartan 80 mg one tablet daily  *If you need a refill on your cardiac medications before your next appointment, please call your pharmacy*   Lab Work: Not needed  Testing/Procedures: Not needed   Follow-Up: At [facility], you and your health needs are our priority.  As part of our continuing mission to provide you with exceptional heart care, we have created designated Provider Care Teams.  These Care Teams include your primary Cardiologist (physician) and Advanced Practice Providers (APPs -  Physician Assistants and Nurse Practitioners) who all work together to provide you with the care you need, when you need it.  We recommend signing up for the patient portal called "MyChart".  Sign up information is provided on this After Visit Summary.  MyChart is used to connect with patients for Virtual Visits (Telemedicine).  Patients are able to view lab/test results, encounter notes, upcoming appointments, etc.  Non-urgent messages can be sent to your provider as well.   To learn more about what you can do with MyChart, go to [email or web address].    Your next appointment:   2 month(s)  The format for your next appointment:   In Person  Provider:   [name], MD   Other Instructions Your physician discussed the importance of regular exercise and recommended that you start or continue a regular exercise program for good health.    Studies Ordered:   Orders Placed This Encounter  Procedures  . EKG 12-Lead     [name], M.D., M.S. Interventional Cardiologist   Pager # [phone] Phone # [phone] [address] [place], [place] [address]   Thank you for choosing Heartcare at [facility]!!

## 2020-05-10 NOTE — Patient Instructions (Signed)
Medication Instructions:  Stop taking Losartan  Start taking Valsartan 80 mg one tablet daily  *If you need a refill on your cardiac medications before your next appointment, please call your pharmacy*   Lab Work: Not needed  Testing/Procedures: Not needed   Follow-Up: At [facility], you and your health needs are our priority.  As part of our continuing mission to provide you with exceptional heart care, we have created designated Provider Care Teams.  These Care Teams include your primary Cardiologist (physician) and Advanced Practice Providers (APPs -  Physician Assistants and Nurse Practitioners) who all work together to provide you with the care you need, when you need it.  We recommend signing up for the patient portal called "MyChart".  Sign up information is provided on this After Visit Summary.  MyChart is used to connect with patients for Virtual Visits (Telemedicine).  Patients are able to view lab/test results, encounter notes, upcoming appointments, etc.  Non-urgent messages can be sent to your provider as well.   To learn more about what you can do with MyChart, go to [email or web address].    Your next appointment:   2 month(s)  The format for your next appointment:   In Person  Provider:   [name], MD   Other Instructions Your physician discussed the importance of regular exercise and recommended that you start or continue a regular exercise program for good health.

## 2020-05-29 ENCOUNTER — Encounter: Payer: Self-pay | Admitting: Cardiology

## 2020-05-29 NOTE — Assessment & Plan Note (Signed)
Did not tolerate beta-blocker.  He had bradycardia and fatigue, that has improved with discontinuing beta-blocker.Marland Kitchen

## 2020-05-29 NOTE — Assessment & Plan Note (Signed)
Relatively old inferior STEMI with occluded RCA treated with DES stent.  He had staged PCI to the LAD.  No significantly reduced EF despite having basal inferoseptal HK.  Unfortunately, despite adjustment of medications and PCI entire RCA with atherectomy, he still just does not seem to have gotten back to his premorbid level of activity.  He is most likely dealing with some depression issues, and has never got back to his baseline.  No really active heart failure or angina symptoms, just not active

## 2020-05-29 NOTE — Assessment & Plan Note (Signed)
Thankfully, he has not had any further angina after extensive PCI to the RCA.  Unable to tolerate beta-blocker because of bradycardia and fatigue/depression.  Plan: Continue rosuvastatin, amlodipine, and ARB (converting to valsartan midodrine)  Lifelong Plavix monotherapy.  Okay to interrupt for procedures.

## 2020-05-29 NOTE — Assessment & Plan Note (Signed)
Extensive PCI to the RCA now-very difficult lesion expansion in the crux of the RCA.  Almost a year out from his PCI no further angina.  Plan:  No longer on aspirin  LIFELONG CLOPIDOGREL, but able to interrupt  Okay to hold 5-7 days preop for surgeries or procedures.

## 2020-05-29 NOTE — Assessment & Plan Note (Signed)
His class III symptoms have resolved.  Now need be class I symptoms if he exerts himself, but is not doing so.  On amlodipine.  We did not use beta-blocker because of bradycardia and fatigue.

## 2020-05-29 NOTE — Assessment & Plan Note (Deleted)
Extensive PCI to the RCA now-very difficult lesion expansion in the crux of the RCA.  Almost a year out from his PCI no further angina.  Plan:  No longer on aspirin  LIFELONG CLOPIDOGREL, but able to interrupt  Okay to hold 5-7 days preop for surgeries or procedures. 

## 2020-05-29 NOTE — Assessment & Plan Note (Signed)
Unstable moderate dose rosuvastatin. Unfortunately, still not able to see LDL level.  Labs are being followed by PCP.  Target LDL is at least < 70, if not <50.  Low threshold to consider more aggressive therapy.  If labs not checked by visit, I plan to recheck.

## 2020-05-29 NOTE — Assessment & Plan Note (Signed)
Blood pressure is high.  With a dry hacking cough I will switch him from losartan to valsartan just to change ARB.  There has been some cross-reactivity with losartan and ACE inhibitor's with dry cough.  Convert to valsartan 80 mg daily. => Reassess some roughly 6 weeks.

## 2020-06-06 ENCOUNTER — Ambulatory Visit: Payer: Federal, State, Local not specified - PPO | Admitting: Cardiology

## 2020-06-21 NOTE — Progress Notes (Signed)
April 27, 2020 Na+ 142, K+ 4.3, Cl- 102, HCO3-26, BUN 18, Cr 0.67, Glu 101, Ca2+ 9.6; Mag 2.2; AST 27, ALT 27, AlkP 86 CBC: W 5.9, H/H 15.0/42.7, Plt 170; B12 573 TC 152, TG 301 (high), HDL 33 (low), LDL LDL 71 (pretty much at goal); Hgb A1c 6.0   Chemistry panel looks good. A1c looks good. Lipid panel also looks pretty good with exception of the triglycerides. This is consistent with change in diet, probably related to increasing starches. Would closely monitor her diet and recheck fasting lipid panel in 3 months, if still elevated, would probably need to consider something like Vascepa.  [name], MD

## 2020-07-04 ENCOUNTER — Other Ambulatory Visit: Payer: Self-pay

## 2020-07-04 ENCOUNTER — Encounter: Payer: Self-pay | Admitting: Cardiology

## 2020-07-04 ENCOUNTER — Ambulatory Visit (INDEPENDENT_AMBULATORY_CARE_PROVIDER_SITE_OTHER): Payer: Medicare Other | Admitting: Cardiology

## 2020-07-04 VITALS — BP 124/90 | HR 81 | Ht 70.0 in | Wt 194.0 lb

## 2020-07-04 DIAGNOSIS — I208 Other forms of angina pectoris: Secondary | ICD-10-CM | POA: Diagnosis not present

## 2020-07-04 DIAGNOSIS — I10 Essential (primary) hypertension: Secondary | ICD-10-CM

## 2020-07-04 DIAGNOSIS — Z9861 Coronary angioplasty status: Secondary | ICD-10-CM | POA: Diagnosis not present

## 2020-07-04 DIAGNOSIS — I25709 Atherosclerosis of coronary artery bypass graft(s), unspecified, with unspecified angina pectoris: Secondary | ICD-10-CM

## 2020-07-04 DIAGNOSIS — I2510 Atherosclerotic heart disease of native coronary artery without angina pectoris: Secondary | ICD-10-CM | POA: Diagnosis not present

## 2020-07-04 DIAGNOSIS — I25119 Atherosclerotic heart disease of native coronary artery with unspecified angina pectoris: Secondary | ICD-10-CM

## 2020-07-04 DIAGNOSIS — E785 Hyperlipidemia, unspecified: Secondary | ICD-10-CM

## 2020-07-04 DIAGNOSIS — R5383 Other fatigue: Secondary | ICD-10-CM

## 2020-07-04 NOTE — Patient Instructions (Addendum)
Medication Instructions:   NO CHANGES  *If you need a refill on your cardiac medications before your next appointment, please call your pharmacy*   Lab Work: NOT NEEDED AT PRESENT   NEXT TIME - LIPID CMP  FASTING   If you have labs (blood work) drawn today and your tests are completely normal, you will receive your results only by: Marland Kitchen MyChart Message (if you have MyChart) OR . A paper copy in the mail If you have any lab test that is abnormal or we need to change your treatment, we will call you to review the results.   Testing/Procedures: NOT NEEDED   Follow-Up: At [facility], you and your health needs are our priority.  As part of our continuing mission to provide you with exceptional heart care, we have created designated Provider Care Teams.  These Care Teams include your primary Cardiologist (physician) and Advanced Practice Providers (APPs -  Physician Assistants and Nurse Practitioners) who all work together to provide you with the care you need, when you need it.     Your next appointment:   6 TO 8  month(s)  The format for your next appointment:   In Person  Provider:   [name], MD

## 2020-07-04 NOTE — Progress Notes (Signed)
Primary Care Provider: Philemon Kingdom, MD Cardiologist: [name], MD Electrophysiologist: None  Clinic Note: Chief Complaint  Patient presents with  . Follow-up    2 months.  . Coronary Artery Disease    No angina  . Hypertension    Medication change, doing well  ===================================  ASSESSMENT/PLAN   Problem List Items Addressed This Visit    Stable angina (HCC) atypical symptoms manifested as exertional dyspnea, fatigue and sweating (Chronic)    At the most may be stable class I class 0 angina symptoms at this point.  Since PCI, has not had any nitroglycerin.  The strange chest discomfort he was having last visit is no longer present.  He is on high-dose amlodipine because of being beta-blocker intolerant-fatigue and bradycardia.      Relevant Orders   Lipid panel   Comprehensive metabolic panel   Coronary artery disease involving native coronary artery of native heart with angina pectoris (HCC) (Chronic)    Stable regimen of statin, calcium blocker, ARB and clopidogrel. No further anginal symptoms.      Essential hypertension (Chronic)    Blood pressure looks better today, diastolic pressure still little high, but systolic is well controlled.  Tolerating the switch to valsartan from losartan.  We still have room to push a little bit further if necessary.      Relevant Orders   Comprehensive metabolic panel   CAD S/P PCI to RCA (2013, 2021) and LAD (2015); therapeutic P2Y12 for Plavix - Primary (Chronic)    Initial PCI was 3 inferior STEMI in March 2013-distal RCA PCI.  In July 2015 had significant LAD lesion stented with DES.  (Progressive disease) Most recently in March 21, was noted to have severe progression of disease in the RCA upstream from stent requiring atherectomy and PCI with 2 additional stents overlapping the proximal stent proximally.  He has extensive amount of stent in the RCA as well as a distal LAD stent.  Plan for now will  be lifelong Plavix that can be held for procedures.  Plan: Continue Plavix and statin as well as amlodipine.  Not on beta-blocker because of bradycardia and fatigue. On ARB recommend reduction.  Okay to hold Plavix 5 to 7 days for procedures or surgery.  (For spinal or neurosurgery is 7 days otherwise 5)      Relevant Orders   Lipid panel   Comprehensive metabolic panel   Fatigue due to treatment (Chronic)    Beta-blocker intolerant.  Doing better off of beta-blocker.  I think a lot of his fatigue is probably more related to lack of motivation and resulting deconditioning.  He is try to work on getting more exercise, now the weather is improving, he hopes to get motivated.      Dyslipidemia, goal LDL below 70 (Chronic)    Labs rechecked in July of last year, seem to be okay, but not available.  If no labs drawn by the time I see him back, we have ordered lipid panel chemistry and prior to 44-month follow-up.  Continue rosuvastatin and co-Q10.      Relevant Orders   Lipid panel   Comprehensive metabolic panel      ===================================  HPI:    [name] is a 65 y.o. male with a PMH notable for prior STEMI with CAD and PCI who presents today for 4 to 68-month follow-up.  CADHistory:   Inferior STEMI MI -> PCI to the RCA in 2013.   Crescendo Angina in July  2015 -> abnormal Myoview --> cath showed severe mid LAD disease treated with DES stent. Also had D1 and circumflex disease evaluated with FFR --> nonsignificant. Medical therapy.  Crescendo Angina March 2021: (Myoview in February 2020 showed inferior ischemia -> cath delayed due to COVID-19 restrictions) ->  ? (06/02/2019), CATH: moderate lesions in the LCA => all negative by DFR, extensive mid-distal RCA disease, calcified prior to previous stent-- ? (06/05/2019), STAGED CSI DES PCI 2 overlapping stents that overlap the distal stent. ->  Reviewed below  12/16/2019. Was doing well. Was having issues with  arthritis but energy level was improving. No further fatigue since PCI. Gradually getting back in shape.  Noted off-and-on skipped beats and exertional dyspnea.   Aspirin and Ranexa stopped  [name] was last seen on May 10, 2020=> he was noting some unusual chest discomfort and palpitations described as a throbbing sensation.  No real chest pain.  Also noted being very deconditioned.  Complained to having an nagging dry cough along with some chest wall pain.  No energy.  We converted from losartan to valsartan with thoughts of the day because of the cough and losartan to ACE inhibitor's.  Recent Hospitalizations: None  Reviewed  CV studies:    The following studies were reviewed today: (if available, images/films reviewed: From Epic Chart or Care Everywhere) . None:  Interval History:   [name] returns today feeling well.  He says the cough is notably improved.  He has been be much more carbs and trying to exercise.  He is trying to do more, and is able to do it, just has issues with motivation getting going.  No major concerns about chest pain or pressure pressure exertion.  Not having the thumping palpitation sensations in his chest.  Sounds like his energy issue as it is lack of motivation to exercise. Overall stable from cardiac standpoint  CV Review of Symptoms (Summary): positive for - dyspnea on exertion and No more chest wall pain no more palpitations/chest complaint.  More of a lack of motivation than fatigue.. negative for - chest pain, edema, irregular heartbeat, orthopnea, palpitations, paroxysmal nocturnal dyspnea, rapid heart rate, shortness of breath or Syncope/near syncope or TIA/amaurosis fugax.  Mild positional dizziness  The patient does not have symptoms concerning for COVID-19 infection (fever, chills, cough, or new shortness of breath).   REVIEWED OF SYSTEMS   Review of Systems  Constitutional: Positive for malaise/fatigue (Low energy, just  lack of "get up and go."-Seems more lack of motivation.). Negative for chills and fever.  HENT: Negative for congestion.   Respiratory: Negative for cough (Chronic dry/hacking cough), shortness of breath and wheezing.   Cardiovascular: Negative for claudication and leg swelling.  Musculoskeletal: Positive for joint pain (Still has his hands get stiff.  Cramping type pain worsened with cold.). Negative for falls.  Neurological: Positive for tingling ( arms and legs will get numb on occasion). Negative for focal weakness and weakness.  Psychiatric/Behavioral: Negative for depression (Not really depression, but he has a bit nausea.  He is not exercising, and he just seems down.) and memory loss. The patient is nervous/anxious and has insomnia (Has difficulty falling asleep, does not wake up).    I have reviewed and (if needed) personally updated the patient's problem list, medications, allergies, past medical and surgical history, social and family history.   PAST MEDICAL HISTORY   Past Medical History:  Diagnosis Date  . Anxiety   . Arthritis    "mostly  in my hands; sometimes other areas" (10/20/2013)  . Bipolar disorder (HCC)   . BPH (benign prostatic hyperplasia)   . CAD S/P percutaneous coronary angioplasty 3/'13, 7/'15; 3/'21   (Therapeutic P2Y12 on Plavix); a) STEMI -m RCA DES PCI; 2015: Class III angina (abnl MYOVIEW w/ Ant-AntLat ischemia) -> m-d LAD 95-99% (PCI: Biotronik AG 2 DES 2.5 x 13, 2.75 mm); b. 3/'21 Cath/Staged PCI: LM nl, mLAD 60% (DFR .96), D1 65%  (DFR .95), mCx 60% (DFR .96); pRCA 60% - m-d 85&70% (CSI -> 3.5x38 Resolute DES p-m, 3.0x8 Resolute DES m-d connecting new & old  STENTS). EF 55-65%.  . Depression   . Dizziness of unknown cause January 2011   abn MRI, Neuro w/u Jan 2011  . Dyslipidemia, goal LDL below 70   . Essential hypertension   . GERD (gastroesophageal reflux disease)   . RA (rheumatoid arthritis) (HCC)   . Rheumatic fever 1969  . ST elevation myocardial  infarction (STEMI) involving right coronary artery in recovery phase (HCC) 06/2011   100% occluded RCA; PCI - 3.0x24 Promus DES (3.5-3.6 mm) ; Mod w moderate LAD & Cx disease, EF 55-60% w/ basal inferoseptal HK confirmed by echo    PAST SURGICAL HISTORY   Past Surgical History:  Procedure Laterality Date  . CORONARY ATHERECTOMY N/A 06/05/2019   Procedure: CORONARY ATHERECTOMY;  Surgeon: Marykay Lex, MD;  Location: [facility] INVASIVE CV LAB;  Service: Cardiovascular; orbital atherectomy-mid RCA  . CORONARY STENT INTERVENTION N/A 06/05/2019   Procedure: CORONARY STENT INTERVENTION;  Surgeon: Marykay Lex, MD;  Location: [facility] INVASIVE CV LAB;  Service: Cardiovascular;; pRCA 60% - m-d 85&70% (CSI -> 3.5x38 Resolute DES p-m, 3.0x8 Resolute DES m-d connecting new & old  STENTS).  Underestimated eccentric lesion in the bend making it difficult to connect the new stent to the prior stent (was thought to be stent - but was calcification  . INTRAVASCULAR PRESSURE WIRE/FFR STUDY N/A 06/02/2019   Procedure: INTRAVASCULAR PRESSURE WIRE/FFR STUDY;  Surgeon: Marykay Lex, MD;  Location: [facility] INVASIVE CV LAB;  Service: Cardiovascular;  LCA: mLAD 60% (DFR .96), D1 65%  (DFR .95), mCx 60% (DFR .96)  . LEFT HEART CATH AND CORONARY ANGIOGRAPHY N/A 06/02/2019   Procedure: LEFT HEART CATH AND CORONARY ANGIOGRAPHY;  Surgeon: Marykay Lex, MD;  Location: [facility] INVASIVE CV LAB;  Service: Cardiovascular;;; LM - nml; mLAD 60% (DFR .96), D1 65%  (DFR .95), mCx 60% (DFR .96); mRCA 60%, 85%&70% (staged CSI PCI)  . LEFT HEART CATHETERIZATION WITH CORONARY ANGIOGRAM N/A 06/28/2011   Procedure: LEFT HEART CATHETERIZATION WITH CORONARY ANGIOGRAM;  Surgeon: Marykay Lex, MD;  Location: [facility] CATH LAB;  50% LAD after D1, D1 30%.  Small D2.  Large caliber LCx with (small OM1) major OM 2 and 3 followed by ABG circumflex with 40% focal lesion terminating with LPL 1.  100% mid RCA (DES PCI); beyond 100% RCA  - large PDA (double barrel) and  small PAV-PL -EF 55 to 60% w/ midInf-LAf HK.  Marland Kitchen LEFT HEART CATHETERIZATION WITH CORONARY ANGIOGRAM N/A 10/20/2013   Procedure: LEFT HEART CATHETERIZATION WITH CORONARY ANGIOGRAM;  Surgeon: Marykay Lex, MD;  Location: [facility] CATH LAB;  Service: Cardiovascular;  Laterality: N/A;  . NM MYOVIEW LTD N/A 10/19/2013   INTERMEDIATE RISK, septal, anterior-anterolateral ischemia --> referred for cath the following day -- LAD LESION TREATED WITH DES PCI  . NM MYOVIEW LTD  05/23/2018   EF estimated 45 to 50%.  Hypertensive response to exercise.  Medium sized moderate severity defect in the basal inferior mid inferolateral wall (read as intermediate risk) -> the defect actually appears to improve on stress imaging suggesting hibernating myocardium.  Marland Kitchen PERCUTANEOUS CORONARY STENT INTERVENTION (PCI-S) N/A 06/2011; 09/2013   a) 3/'13: 100%  RCA ->PCI 3.20x2 (3.6) Promus DES stent; 7/'15: mLAD PCI Biotronik AG DES 2.5 x13 2.75 mm), FFR D1 50-60% = 0.86 (small branch had ~70%), mCx ~50-60% FFR=1.0.  . PLANTAR FASCIA SURGERY Left 2004   "& neuroma"  . SHOULDER ARTHROSCOPY W/ ROTATOR CUFF REPAIR Left July 2011  . TEMPORARY PACEMAKER N/A 06/05/2019   Procedure: TEMPORARY PACEMAKER;  Surgeon: Marykay Lex, MD;  Location: [facility] INVASIVE CV LAB;  Service: Cardiovascular;  Laterality: N/A;  . TRANSTHORACIC ECHOCARDIOGRAM N/A 05/2016   EF ~55%. Normal LV Size & function. NO RWMA (inferior HK no longer present). Mlid LVH - Gr 1 DD.. Mild LA dilation.     Cardiac Cath 06/02/2019: Severe RCA disease with tandem 80 and 70% lesions, heavily calcified as likely culprit (prior to previous stent); Moderate (DFR negative) prox D1, mid LAD & mid LCx lesions with otherwise widely patent distal LAD stent; Normal LVEF and EDP.      06/05/2019 STAGED ATHERECTOMY PCI RCA: Resolute Onyx 3.0 mm 8 mm overlapping most distal stent followed by 3.5 mm x 38 mm proximal covering up to the 60% lesion. -Difficult to overlap the distal stent because  of unappreciated lesion in the crux of the RCA      There is no immunization history on file for this patient.  MEDICATIONS/ALLERGIES   Current Meds  Medication Sig  . acetaminophen (TYLENOL) 500 MG tablet Take 500-1,000 mg by mouth every 6 (six) hours as needed (for pain.).  Marland Kitchen amLODipine (NORVASC) 10 MG tablet TAKE 1 TABLET BY MOUTH DAILY  . azelastine (ASTELIN) 0.1 % nasal spray Place 1 spray into both nostrils 2 (two) times daily as needed for rhinitis or allergies. Use in each nostril as directed  . clopidogrel (PLAVIX) 75 MG tablet Take 1 tablet (75 mg total) by mouth daily.  . Coenzyme Q10 (CO Q-10) 300 MG CAPS Take 300 mg by mouth daily.  . finasteride (PROSCAR) 5 MG tablet Take 5 mg by mouth daily.  . fluticasone (FLONASE) 50 MCG/ACT nasal spray Place 1 spray into the nose daily as needed for allergies. For allergies  . nitroGLYCERIN (NITROSTAT) 0.4 MG SL tablet Place 1 tablet (0.4 mg total) under the tongue every 5 (five) minutes as needed for chest pain.  . pantoprazole (PROTONIX) 40 MG tablet Take 40 mg by mouth daily.  . rosuvastatin (CRESTOR) 20 MG tablet TAKE 1 TABLET BY MOUTH DAILY  . valsartan (DIOVAN) 80 MG tablet Take 1 tablet (80 mg total) by mouth daily.  . [DISCONTINUED] eszopiclone (LUNESTA) 1 MG TABS tablet Take 1 mg by mouth at bedtime.  . [DISCONTINUED] Misc Natural Products (TURMERIC CURCUMIN) CAPS Take 1 capsule by mouth daily.    No Known Allergies  SOCIAL HISTORY/FAMILY HISTORY   Reviewed in Epic:  Pertinent findings:  Social History   Tobacco Use  . Smoking status: Never Smoker  . Smokeless tobacco: Never Used  Substance Use Topics  . Alcohol use: No  . Drug use: No   Social History   Social History Narrative   Married father of 2. Never smoked. Does not drink alcohol significantly.   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   He does  play golf on a fairly regular basis, but is currently not walking between holes, but riding. He is starting to play with a new partner who likes to walk, so he is hoping to get more walking.    OBJCTIVE -PE, EKG, labs   Wt Readings from Last 3 Encounters:  07/04/20 194 lb (88 kg)  05/10/20 197 lb 9.6 oz (89.6 kg)  12/16/19 194 lb (88 kg)   Physical Exam: BP 124/90 (BP Location: Left Arm, Patient Position: Sitting, Cuff Size: Normal)   Pulse 81   Ht 5\' 10"  (1.778 m)   Wt 194 lb (88 kg)   BMI 27.84 kg/m  Physical Exam Vitals reviewed.  Constitutional:      General: He is not in acute distress.    Appearance: Normal appearance. He is obese. He is not ill-appearing or toxic-appearing.     Comments: Healthy-appearing.  Well-groomed.  Seems a little bit down.  HENT:     Head: Normocephalic and atraumatic.  Neck:     Vascular: No carotid bruit, hepatojugular reflux or JVD.  Cardiovascular:     Rate and Rhythm: Normal rate and regular rhythm.  No extrasystoles are present.    Chest Wall: PMI is not displaced.     Pulses: Normal pulses and intact distal pulses.     Heart sounds: Normal heart sounds. No murmur heard. No friction rub. No gallop.   Pulmonary:     Effort: Pulmonary effort is normal. No respiratory distress.     Breath sounds: Normal breath sounds.  Musculoskeletal:        General: No swelling. Normal range of motion.     Cervical back: Normal range of motion and neck supple.  Skin:    General: Skin is warm and dry.  Neurological:     General: No focal deficit present.     Mental Status: He is alert and oriented to person, place, and time. Mental status is at baseline.  Psychiatric:        Mood and Affect: Mood normal.        Behavior: Behavior normal.        Thought Content: Thought content normal.        Judgment: Judgment normal.     Comments: A little anxious     Adult ECG Report N/A  Recent Labs:   10/15/2019: TC 141, TG 211, HDL 40 (LDL not reported).   A1c 5.6.  Hgb 14.6,Cr 0.4  Labs ordered today Lab Results  Component Value Date   CREATININE 0.92 06/06/2019   BUN 13 06/06/2019   NA 139 06/06/2019   K 3.8 06/06/2019   CL 104 06/06/2019   CO2 27 06/06/2019   CBC Latest Ref Rng & Units 06/06/2019 06/05/2019 05/20/2019  WBC 4.0 - 10.5 K/uL 6.1 5.5 6.0  Hemoglobin 13.0 - 17.0 g/dL 05/22/2019 02.5 42.7  Hematocrit 39.0 - 52.0 % 38.0(L) 44.1 42.0  Platelets 150 - 400 K/uL 154 157 173    Lab Results  Component Value Date   TSH 0.841 10/16/2013    ==================================================  COVID-19 Education: The signs and symptoms of COVID-19 were discussed with the patient and how to seek care for testing (follow up with PCP or arrange E-visit).   The importance of social distancing and COVID-19 vaccination was discussed today. The patient is practicing social distancing & Masking.   I spent a total of 28 minutes with the patient spent in  direct patient consultation.  Additional time spent with chart review  / charting (studies, outside notes, etc): Total Time: 38 min   Current medicines are reviewed at length with the patient today.  (+/- concerns) n/a  This visit occurred during the SARS-CoV-2 public health emergency.  Safety protocols were in place, including screening questions prior to the visit, additional usage of staff PPE, and extensive cleaning of exam room while observing appropriate contact time as indicated for disinfecting solutions.  Notice: This dictation was prepared with Dragon dictation along with smaller phrase technology. Any transcriptional errors that result from this process are unintentional and may not be corrected upon review.  Patient Instructions / Medication Changes & Studies & Tests Ordered   Patient Instructions  Medication Instructions:   NO CHANGES  *If you need a refill on your cardiac medications before your next appointment, please call your pharmacy*   Lab Work: NOT NEEDED AT  PRESENT   NEXT TIME - LIPID CMP  FASTING   If you have labs (blood work) drawn today and your tests are completely normal, you will receive your results only by: Marland Kitchen MyChart Message (if you have MyChart) OR . A paper copy in the mail If you have any lab test that is abnormal or we need to change your treatment, we will call you to review the results.   Testing/Procedures: NOT NEEDED   Follow-Up: At [facility], you and your health needs are our priority.  As part of our continuing mission to provide you with exceptional heart care, we have created designated Provider Care Teams.  These Care Teams include your primary Cardiologist (physician) and Advanced Practice Providers (APPs -  Physician Assistants and Nurse Practitioners) who all work together to provide you with the care you need, when you need it.     Your next appointment:   6 TO 8  month(s)  The format for your next appointment:   In Person  Provider:   [name], MD       Studies Ordered:   Orders Placed This Encounter  Procedures  . Lipid panel  . Comprehensive metabolic panel     [name], M.D., M.S. Interventional Cardiologist   Pager # [phone] Phone # [phone] [address] [place], [place] [address]   Thank you for choosing Heartcare at [facility]!!

## 2020-07-17 ENCOUNTER — Encounter: Payer: Self-pay | Admitting: Cardiology

## 2020-07-17 NOTE — Assessment & Plan Note (Signed)
Beta-blocker intolerant.  Doing better off of beta-blocker.  I think a lot of his fatigue is probably more related to lack of motivation and resulting deconditioning.  He is try to work on getting more exercise, now the weather is improving, he hopes to get motivated.

## 2020-07-17 NOTE — Assessment & Plan Note (Signed)
Labs rechecked in July of last year, seem to be okay, but not available.  If no labs drawn by the time I see him back, we have ordered lipid panel chemistry and prior to 31-month follow-up.  Continue rosuvastatin and co-Q10.

## 2020-07-17 NOTE — Assessment & Plan Note (Addendum)
At the most may be stable class I class 0 angina symptoms at this point.  Since PCI, has not had any nitroglycerin.  The strange chest discomfort he was having last visit is no longer present.  He is on high-dose amlodipine because of being beta-blocker intolerant-fatigue and bradycardia.

## 2020-07-17 NOTE — Assessment & Plan Note (Signed)
Initial PCI was 3 inferior STEMI in March 2013-distal RCA PCI.  In July 2015 had significant LAD lesion stented with DES.  (Progressive disease) Most recently in March 21, was noted to have severe progression of disease in the RCA upstream from stent requiring atherectomy and PCI with 2 additional stents overlapping the proximal stent proximally.  He has extensive amount of stent in the RCA as well as a distal LAD stent.  Plan for now will be lifelong Plavix that can be held for procedures.  Plan: Continue Plavix and statin as well as amlodipine.  Not on beta-blocker because of bradycardia and fatigue. On ARB recommend reduction.  Okay to hold Plavix 5 to 7 days for procedures or surgery.  (For spinal or neurosurgery is 7 days otherwise 5)

## 2020-07-17 NOTE — Assessment & Plan Note (Signed)
Blood pressure looks better today, diastolic pressure still little high, but systolic is well controlled.  Tolerating the switch to valsartan from losartan.  We still have room to push a little bit further if necessary.

## 2020-07-17 NOTE — Assessment & Plan Note (Signed)
Stable regimen of statin, calcium blocker, ARB and clopidogrel. No further anginal symptoms.

## 2020-08-15 ENCOUNTER — Other Ambulatory Visit: Payer: Self-pay | Admitting: Cardiology

## 2020-09-02 ENCOUNTER — Other Ambulatory Visit: Payer: Self-pay | Admitting: Cardiology

## 2021-01-24 ENCOUNTER — Other Ambulatory Visit: Payer: Self-pay | Admitting: Cardiology

## 2021-01-24 ENCOUNTER — Other Ambulatory Visit: Payer: Self-pay

## 2021-01-24 ENCOUNTER — Encounter: Payer: Self-pay | Admitting: Psychiatry

## 2021-01-24 ENCOUNTER — Ambulatory Visit: Payer: Medicare Other | Admitting: Psychiatry

## 2021-01-24 DIAGNOSIS — I2510 Atherosclerotic heart disease of native coronary artery without angina pectoris: Secondary | ICD-10-CM | POA: Diagnosis not present

## 2021-01-24 DIAGNOSIS — F411 Generalized anxiety disorder: Secondary | ICD-10-CM

## 2021-01-24 DIAGNOSIS — Z9861 Coronary angioplasty status: Secondary | ICD-10-CM

## 2021-01-24 MED ORDER — SERTRALINE HCL 50 MG PO TABS: ORAL_TABLET | ORAL | 1 refills | Status: DC

## 2021-01-24 NOTE — Progress Notes (Signed)
[facility] MD/PA/NP Initial Note  01/24/2021 8:18 PM [name]  MRN:  [id number]  Chief Complaint:  Chief Complaint   New Patient (Initial Visit); Irritability     HPI: Seen with wife Joy.  wife, Ander Slade, says I'm irritable a lot.  She thinks it's directed towards her.  He recognizes it as "generalized irritability". Always been there.   F was an irritable person.   Some stress with 66 yo D living with them temporarily. Pt reports that mood is Irritable and describes anxiety as Mild-moderate. Anxiety symptoms include:  irritable.  And some chronic worry anxiety and occ tremor. Enjoys golf and fishing.  Pt reports has difficulty falling asleep. Pt reports that appetite is good. Pt reports that energy is no change and good. Concentration is down slightly. Suicidal thoughts:  denied by patient.  Is forgetful for STM.  Wife says he explodes over the littlest things. Not phasic.  No psych meds since 2017 and retirement.  Visit Diagnosis:    ICD-10-CM   1. Generalized anxiety disorder  F41.1 sertraline (ZOLOFT) 50 MG tablet      Past Psychiatric History: Dr. Jennelle Human from 2007-2017 Past Psychiatric Medication Trials: Zolpidem poor response Lunesta 2 effective Clonazepam 0.5 mg Paroxetine 10, citalopram 80, sertraline 100, Effexor 37.5,  Wellbutrin 150, Depakote fatigue Adderall, Ritalin Geodon panic, Seroquel hangover Lithium SE  Past Medical History:  Past Medical History:  Diagnosis Date   Anxiety    Arthritis    "mostly in my hands; sometimes other areas" (10/20/2013)   Bipolar disorder (HCC)    BPH (benign prostatic hyperplasia)    CAD S/P percutaneous coronary angioplasty 3/'13, 7/'15; 3/'21   (Therapeutic P2Y12 on Plavix); a) STEMI -m RCA DES PCI; 2015: Class III angina (abnl MYOVIEW w/ Ant-AntLat ischemia) -> m-d LAD 95-99% (PCI: Biotronik AG 2 DES 2.5 x 13, 2.75 mm); b. 3/'21 Cath/Staged PCI: LM nl, mLAD 60% (DFR .96), D1 65%  (DFR .95), mCx 60% (DFR .96); pRCA 60% -  m-d 85&70% (CSI -> 3.5x38 Resolute DES p-m, 3.0x8 Resolute DES m-d connecting new & old  STENTS). EF 55-65%.   Depression    Dizziness of unknown cause January 2011   abn MRI, Neuro w/u Jan 2011   Dyslipidemia, goal LDL below 70    Essential hypertension    GERD (gastroesophageal reflux disease)    RA (rheumatoid arthritis) (HCC)    Rheumatic fever 1969   ST elevation myocardial infarction (STEMI) involving right coronary artery in recovery phase (HCC) 06/2011   100% occluded RCA; PCI - 3.0x24 Promus DES (3.5-3.6 mm) ; Mod w moderate LAD & Cx disease, EF 55-60% w/ basal inferoseptal HK confirmed by echo    Past Surgical History:  Procedure Laterality Date   CORONARY ATHERECTOMY N/A 06/05/2019   Procedure: CORONARY ATHERECTOMY;  Surgeon: Marykay Lex, MD;  Location: [facility] INVASIVE CV LAB;  Service: Cardiovascular; orbital atherectomy-mid RCA   CORONARY STENT INTERVENTION N/A 06/05/2019   Procedure: CORONARY STENT INTERVENTION;  Surgeon: Marykay Lex, MD;  Location: [facility] INVASIVE CV LAB;  Service: Cardiovascular;; pRCA 60% - m-d 85&70% (CSI -> 3.5x38 Resolute DES p-m, 3.0x8 Resolute DES m-d connecting new & old  STENTS).  Underestimated eccentric lesion in the bend making it difficult to connect the new stent to the prior stent (was thought to be stent - but was calcification   INTRAVASCULAR PRESSURE WIRE/FFR STUDY N/A 06/02/2019   Procedure: INTRAVASCULAR PRESSURE WIRE/FFR STUDY;  Surgeon: Marykay Lex, MD;  Location: [facility] INVASIVE  CV LAB;  Service: Cardiovascular;  LCA: mLAD 60% (DFR .96), D1 65%  (DFR .95), mCx 60% (DFR .96)   LEFT HEART CATH AND CORONARY ANGIOGRAPHY N/A 06/02/2019   Procedure: LEFT HEART CATH AND CORONARY ANGIOGRAPHY;  Surgeon: Marykay Lex, MD;  Location: [facility] INVASIVE CV LAB;  Service: Cardiovascular;;; LM - nml; mLAD 60% (DFR .96), D1 65%  (DFR .95), mCx 60% (DFR .96); mRCA 60%, 85%&70% (staged CSI PCI)   LEFT HEART CATHETERIZATION WITH CORONARY ANGIOGRAM N/A 06/28/2011    Procedure: LEFT HEART CATHETERIZATION WITH CORONARY ANGIOGRAM;  Surgeon: Marykay Lex, MD;  Location: [facility] CATH LAB;  50% LAD after D1, D1 30%.  Small D2.  Large caliber LCx with (small OM1) major OM 2 and 3 followed by ABG circumflex with 40% focal lesion terminating with LPL 1.  100% mid RCA (DES PCI); beyond 100% RCA  - large PDA (double barrel) and small PAV-PL -EF 55 to 60% w/ midInf-LAf HK.   LEFT HEART CATHETERIZATION WITH CORONARY ANGIOGRAM N/A 10/20/2013   Procedure: LEFT HEART CATHETERIZATION WITH CORONARY ANGIOGRAM;  Surgeon: Marykay Lex, MD;  Location: [facility] CATH LAB;  Service: Cardiovascular;  Laterality: N/A;   NM MYOVIEW LTD N/A 10/19/2013   INTERMEDIATE RISK, septal, anterior-anterolateral ischemia --> referred for cath the following day -- LAD LESION TREATED WITH DES PCI   NM MYOVIEW LTD  05/23/2018   EF estimated 45 to 50%.  Hypertensive response to exercise.  Medium sized moderate severity defect in the basal inferior mid inferolateral wall (read as intermediate risk) -> the defect actually appears to improve on stress imaging suggesting hibernating myocardium.   PERCUTANEOUS CORONARY STENT INTERVENTION (PCI-S) N/A 06/2011; 09/2013   a) 3/'13: 100%  RCA ->PCI 3.20x2 (3.6) Promus DES stent; 7/'15: mLAD PCI Biotronik AG DES 2.5 x13 2.75 mm), FFR D1 50-60% = 0.86 (small branch had ~70%), mCx ~50-60% FFR=1.0.   PLANTAR FASCIA SURGERY Left 07/21/2002   "& neuroma"   SHOULDER ARTHROSCOPY W/ ROTATOR CUFF REPAIR Left July 2011   TEMPORARY PACEMAKER N/A 06/05/2019   Procedure: TEMPORARY PACEMAKER;  Surgeon: Marykay Lex, MD;  Location: [facility] INVASIVE CV LAB;  Service: Cardiovascular;  Laterality: N/A;   TRANSTHORACIC ECHOCARDIOGRAM N/A 05/2016   EF ~55%. Normal LV Size & function. NO RWMA (inferior HK no longer present). Mlid LVH - Gr 1 DD.. Mild LA dilation.     Family Psychiatric History: D bipolar and ADD, Son ADD.  F temper problems 3 sisters- 1 alcoholic Pat uncled presumed  suicide  Family History:  Family History  Problem Relation Age of Onset   Heart failure Maternal Grandmother 34   Cancer Paternal Grandmother 85   Heart failure Paternal Grandfather 61   Cancer - Colon Mother 26   Heart attack Father 2       open heart surgery/valve replaced   Pneumonia Father        Died from complications in 20-Jul-2017   Lupus Father        Diagnosed just prior to death    Social History: rare alcohol. Social History   Socioeconomic History   Marital status: Married    Spouse name: Not on file   Number of children: 2   Years of education: Not on file   Highest education level: Not on file  Occupational History   Occupation: Mail carrier    Employer: [employer]    Comment: Retired in 07-21-2015 Tobacco Use   Smoking status: Never  Smokeless tobacco: Never  Substance and Sexual Activity   Alcohol use: No   Drug use: No   Sexual activity: Not Currently  Other Topics Concern   Not on file  Social History Narrative   Married father of 2. Never smoked. Does not drink alcohol significantly.   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   He does play golf on a fairly regular basis, but is currently not walking between holes, but riding. He is starting to play with a new partner who likes to walk, so he is hoping to get more walking.   Social Determinants of Health   Financial Resource Strain: Not on file  Food Insecurity: Not on file  Transportation Needs: Not on file  Physical Activity: Not on file  Stress: Not on file  Social Connections: Not on file    Allergies: No Known Allergies  Metabolic Disorder Labs: Lab Results  Component Value Date   HGBA1C 5.5 06/28/2011   MPG 111 06/28/2011   No results found for: PROLACTIN Lab Results  Component Value Date   CHOL 155 04/15/2018   TRIG 146 04/15/2018   HDL 40 04/15/2018   CHOLHDL 3.9  04/15/2018   VLDL 31 (H) 05/09/2016   LDLCALC 86 04/15/2018   LDLCALC 72 05/09/2016   Lab Results  Component Value Date   TSH 0.841 10/16/2013    Therapeutic Level Labs: No results found for: LITHIUM No results found for: VALPROATE No components found for:  CBMZ  Current Medications: Current Outpatient Medications  Medication Sig Dispense Refill   acetaminophen (TYLENOL) 500 MG tablet Take 500-1,000 mg by mouth every 6 (six) hours as needed (for pain.).     amLODipine (NORVASC) 10 MG tablet TAKE 1 TABLET BY MOUTH DAILY 90 tablet 3   azelastine (ASTELIN) 0.1 % nasal spray Place 1 spray into both nostrils 2 (two) times daily as needed for rhinitis or allergies. Use in each nostril as directed     clopidogrel (PLAVIX) 75 MG tablet TAKE 1 TABLET BY MOUTH DAILY 90 tablet 3   Coenzyme Q10 (CO Q-10) 300 MG CAPS Take 300 mg by mouth daily. 90 each 11   finasteride (PROSCAR) 5 MG tablet Take 5 mg by mouth daily.     fluticasone (FLONASE) 50 MCG/ACT nasal spray Place 1 spray into the nose daily as needed for allergies. For allergies     niacinamide 500 MG tablet Take 500 mg by mouth 2 (two) times daily with a meal. Taking only once a day for precancerous scalp lesion     nitroGLYCERIN (NITROSTAT) 0.4 MG SL tablet Place 1 tablet (0.4 mg total) under the tongue every 5 (five) minutes as needed for chest pain. 25 tablet 4   pantoprazole (PROTONIX) 40 MG tablet Take 40 mg by mouth daily.     rosuvastatin (CRESTOR) 20 MG tablet TAKE 1 TABLET BY MOUTH DAILY 90 tablet 3   sertraline (ZOLOFT) 50 MG tablet 1/2 tablet daily for 4 days, then 1 tablet daily 30 tablet 1   valsartan (DIOVAN) 80 MG tablet Take 1 tablet (80 mg total) by mouth daily. 90 tablet 3   No current facility-administered medications for this visit.    Medication Side Effects: none  Orders placed this visit:  No orders of the defined types were placed in this encounter.   Psychiatric Specialty Exam:  Review of Systems  Musculoskeletal:  Positive for back pain.   There were no vitals taken for this visit.There is no height or weight on file to calculate BMI.  General Appearance: Casual  Eye Contact:  Good  Speech:  Normal Rate  Volume:  Normal  Mood:  Irritable  Affect:  Appropriate  Thought Process:  Coherent, Goal Directed, and Descriptions of Associations: Intact  Orientation:  Full (Time, Place, and Person)  Thought Content: Logical and Hallucinations: None   Suicidal Thoughts:  No  Homicidal Thoughts:  No  Memory:  WNL  Judgement:  Good  Insight:  Fair  Psychomotor Activity:  Normal  Concentration:  Concentration: Good  Recall:  Good  Fund of Knowledge: Good  Language: Good  Assets:  Architect Housing Intimacy Leisure Time Physical Health Resilience Social Support Talents/Skills Transportation Vocational/Educational  ADL's:  Intact  Cognition: WNL  Prognosis:  Good   Screenings:  MDQ negative    Receiving Psychotherapy:  past history fo therapy  Treatment Plan/Recommendations: Discussed the diagnosis.  He has a history of chronic irritability with some anxiety.  It is not phasic as would be expected with bipolar disorder.  He has been tried on mood stabilizers in the past without sufficient benefit.  He has been treated with SSRIs most prominently Paxil with some significant benefit according to both the patient and his wife.  However he complained of sexual side effects from Paxil and wanted off the medication.  He had a very brief trial with sertraline up to 100 mg daily for only about a month.  There was some benefit noted.  He has been tried on other SSRIs as noted above.  The most logical plan would be to use sertraline because it tends to have less sexual side effect, tiredness, and weight gain then does paroxetine.  He did not have a prolonged trial with sertraline.  It tends to be very effective for irritability in general and not  significantly prone to weight gain or sedation.  He agrees to another trial of sertraline and we will proceed cautiously given his history of benefit of paroxetine 10 mg daily.  We will increase sertraline to 50 mg daily but expect to need to go to 100 mg.  If he does not see significant benefit within the first month then he will call back to the office and we will increase the dosage from 50 mg to 100 mg daily.  Discussed the side effects of SSRIs including SSRI withdrawal.  Reviewed old old chart in detail.  This plan was discussed with both he and his wife and they are in agreement.  Follow-up in 8 weeks  Meredith Staggers MD, DFAPA    Lauraine Rinne, MD

## 2021-03-30 ENCOUNTER — Other Ambulatory Visit: Payer: Self-pay | Admitting: Psychiatry

## 2021-03-30 DIAGNOSIS — F411 Generalized anxiety disorder: Secondary | ICD-10-CM

## 2021-04-04 ENCOUNTER — Encounter: Payer: Self-pay | Admitting: Psychiatry

## 2021-04-04 ENCOUNTER — Ambulatory Visit (INDEPENDENT_AMBULATORY_CARE_PROVIDER_SITE_OTHER): Payer: Medicare Other | Admitting: Psychiatry

## 2021-04-04 ENCOUNTER — Other Ambulatory Visit: Payer: Self-pay

## 2021-04-04 DIAGNOSIS — F411 Generalized anxiety disorder: Secondary | ICD-10-CM

## 2021-04-04 MED ORDER — PAROXETINE HCL 20 MG PO TABS: 20.0000 mg | ORAL_TABLET | Freq: Every day | ORAL | 0 refills | Status: DC

## 2021-04-04 NOTE — Progress Notes (Signed)
[name] [id number] [birth year] 66 y.o.  Subjective:   Patient ID:  [name] is a 66 y.o. (DOB 11/14/1955) male.  Chief Complaint:  Chief Complaint  Patient presents with   Follow-up   Anxiety    HPI [name] presents to the office today for follow-up of generalized anxiety disorder with prominent irritability as a primary symptom. First visit 01/24/21 appt started sertraline 50 mg daily.  She thinks it's helping some but not enough.  He thinks some is better. Still has irritability.  Always been kind of a nervous anxious person as was mother. SE constant GI gurgling very annoying.   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  That is a welcome benefit.    Past Psychiatric History: Dr. Clovis Pu from 2007-2017 Past Psychiatric Medication Trials: Zolpidem poor response Lunesta 2 effective Clonazepam 0.5 mg Paroxetine 10, citalopram 80, sertraline 100, Effexor 37.5,  Wellbutrin 150, Depakote fatigue Adderall, Ritalin Geodon panic, Seroquel hangover Lithium SE  Review of Systems:  Review of Systems  Musculoskeletal:  Positive for arthralgias and back pain.  Neurological:        Neuropathy   Medications: I have reviewed the patient's current medications.  Current Outpatient Medications  Medication Sig Dispense Refill   acetaminophen (TYLENOL) 500 MG tablet Take 500-1,000 mg by mouth every 6 (six) hours as needed (for pain.).     amLODipine (NORVASC) 10 MG tablet TAKE 1 TABLET BY MOUTH DAILY 90 tablet 3   azelastine (ASTELIN) 0.1 % nasal spray Place 1 spray into both nostrils 2 (two) times daily as needed for rhinitis or allergies. Use in each nostril as directed     clopidogrel (PLAVIX) 75 MG tablet TAKE 1 TABLET BY MOUTH DAILY 90 tablet 3   Coenzyme Q10 (CO Q-10) 300 MG CAPS Take 300 mg by mouth daily. 90 each 11   finasteride (PROSCAR) 5 MG tablet Take 5 mg by mouth daily.     fluticasone (FLONASE) 50 MCG/ACT nasal spray Place 1 spray into the nose daily as  needed for allergies. For allergies     niacinamide 500 MG tablet Take 500 mg by mouth 2 (two) times daily with a meal. Taking only once a day for precancerous scalp lesion     nitroGLYCERIN (NITROSTAT) 0.4 MG SL tablet Place 1 tablet (0.4 mg total) under the tongue every 5 (five) minutes as needed for chest pain. 25 tablet 4   pantoprazole (PROTONIX) 40 MG tablet Take 40 mg by mouth daily.     PARoxetine (PAXIL) 20 MG tablet Take 1 tablet (20 mg total) by mouth daily. 90 tablet 0   rosuvastatin (CRESTOR) 20 MG tablet TAKE 1 TABLET BY MOUTH DAILY 90 tablet 3   valsartan (DIOVAN) 80 MG tablet Take 1 tablet (80 mg total) by mouth daily. 90 tablet 3   No current facility-administered medications for this visit.    Medication Side Effects: GI and some sexual reduced interest  Allergies: No Known Allergies  Past Medical History:  Diagnosis Date   Anxiety    Arthritis    "mostly in my hands; sometimes other areas" (10/20/2013)   Bipolar disorder ([place])    BPH (benign prostatic hyperplasia)    CAD S/P percutaneous coronary angioplasty 3/'13, 7/'15; 3/'21   (Therapeutic P2Y12 on Plavix); a) STEMI -m RCA DES PCI; 2015: Class III angina (abnl MYOVIEW w/ Ant-AntLat ischemia) -> m-d LAD 95-99% (PCI: Biotronik AG 2 DES 2.5 x 13, 2.75 mm); b. 3/'21 Cath/Staged PCI: LM nl, mLAD 60% (DFR .  96), D1 65%  (DFR .95), mCx 60% (DFR .96); pRCA 60% - m-d 85&70% (CSI -> 3.5x38 Resolute DES p-m, 3.0x8 Resolute DES m-d connecting new & old  STENTS). EF 55-65%.   Depression    Dizziness of unknown cause January 2011   abn MRI, Neuro w/u Jan 2011   Dyslipidemia, goal LDL below 70    Essential hypertension    GERD (gastroesophageal reflux disease)    RA (rheumatoid arthritis) (HCC)    Rheumatic fever 1969   ST elevation myocardial infarction (STEMI) involving right coronary artery in recovery phase ([place]) 06/2011   100% occluded RCA; PCI - 3.0x24 Promus DES (3.5-3.6 mm) ; Mod w moderate LAD & Cx disease, EF 55-60% w/  basal inferoseptal HK confirmed by echo    Past Medical History, Surgical history, Social history, and Family history were reviewed and updated as appropriate.   Please see review of systems for further details on the patient's review from today.   Objective:   Physical Exam:  There were no vitals taken for this visit.  Physical Exam Constitutional:      General: He is not in acute distress. Musculoskeletal:        General: No deformity.  Neurological:     Mental Status: He is alert and oriented to person, place, and time.     Coordination: Coordination normal.  Psychiatric:        Attention and Perception: Attention and perception normal. He does not perceive auditory or visual hallucinations.        Mood and Affect: Mood is anxious. Mood is not depressed. Affect is not labile, blunt, angry or inappropriate.        Speech: Speech normal.        Behavior: Behavior normal.        Thought Content: Thought content normal. Thought content is not paranoid or delusional. Thought content does not include homicidal or suicidal ideation. Thought content does not include suicidal plan.        Cognition and Memory: Cognition and memory normal.        Judgment: Judgment normal.     Comments: Insight intact Still some irritability and anxiety but improved.    Lab Review:     Component Value Date/Time   NA 139 06/06/2019 0341   NA 141 05/20/2019 1642   K 3.8 06/06/2019 0341   CL 104 06/06/2019 0341   CO2 27 06/06/2019 0341   GLUCOSE 81 06/06/2019 0341   BUN 13 06/06/2019 0341   BUN 21 05/20/2019 1642   CREATININE 0.92 06/06/2019 0341   CREATININE 0.86 05/09/2016 1039   CALCIUM 8.9 06/06/2019 0341   PROT 6.9 05/09/2016 1039   ALBUMIN 4.8 05/09/2016 1039   AST 39 (H) 05/09/2016 1039   ALT 51 (H) 05/09/2016 1039   ALKPHOS 84 05/09/2016 1039   BILITOT 1.0 05/09/2016 1039   GFRNONAA >60 06/06/2019 0341   GFRNONAA >89 10/16/2013 1455   GFRAA >60 06/06/2019 0341   GFRAA >89  10/16/2013 1455       Component Value Date/Time   WBC 6.1 06/06/2019 0341   RBC 4.35 06/06/2019 0341   HGB 13.3 06/06/2019 0341   HGB 15.4 05/20/2019 1642   HCT 38.0 (L) 06/06/2019 0341   HCT 42.0 05/20/2019 1642   PLT 154 06/06/2019 0341   PLT 173 05/20/2019 1642   MCV 87.4 06/06/2019 0341   MCV 84 05/20/2019 1642   MCH 30.6 06/06/2019 0341   MCHC 35.0 06/06/2019 0341  RDW 12.6 06/06/2019 0341   RDW 13.1 05/20/2019 1642    No results found for: POCLITH, LITHIUM   No results found for: PHENYTOIN, PHENOBARB, VALPROATE, CBMZ   .res Assessment: Plan:    Braxtynn was seen today for follow-up and anxiety.  Diagnoses and all orders for this visit:  Generalized anxiety disorder -     PARoxetine (PAXIL) 20 MG tablet; Take 1 tablet (20 mg total) by mouth daily.  Wife sees some degree of improvement with sertraline 50 mg in terms of his irritability.  He does not notice much change.  However he complains of side effects from the sertraline 50 mg with GI problems.  He did rather switch back to the paroxetine which he had taken in the past. Interestingly he is seen improvement in her neuropathy symptoms on the sertraline and would like to keep that benefit.  We will observe for the change after switching him back to paroxetine  Switch back to paroxetine at 20 mg daily DT GI SE sertraline 50 over 7 days.  Disc constipation management.    Fu 8 weeks.  Lynder Parents, MD, DFAPA    Please see After Visit Summary for patient specific instructions.  Future Appointments  Date Time Provider [place]  06/05/2021  2:00 PM Cottle, Billey Co., MD CP-CP None     No orders of the defined types were placed in this encounter.   -------------------------------

## 2021-05-08 ENCOUNTER — Other Ambulatory Visit: Payer: Self-pay | Admitting: Cardiology

## 2021-06-05 ENCOUNTER — Other Ambulatory Visit: Payer: Self-pay

## 2021-06-05 ENCOUNTER — Other Ambulatory Visit: Payer: Self-pay | Admitting: Psychiatry

## 2021-06-05 ENCOUNTER — Encounter: Payer: Self-pay | Admitting: Psychiatry

## 2021-06-05 ENCOUNTER — Ambulatory Visit (INDEPENDENT_AMBULATORY_CARE_PROVIDER_SITE_OTHER): Payer: Medicare Other | Admitting: Psychiatry

## 2021-06-05 DIAGNOSIS — F411 Generalized anxiety disorder: Secondary | ICD-10-CM

## 2021-06-05 MED ORDER — FLUOXETINE HCL 20 MG PO CAPS: 20.0000 mg | ORAL_CAPSULE | Freq: Every day | ORAL | 0 refills | Status: DC

## 2021-06-05 NOTE — Patient Instructions (Addendum)
Start fluoxetine 1 daily and reduce paroxetine to 1/2 daily for 1 week then stop paroxetine ?

## 2021-06-05 NOTE — Progress Notes (Signed)
Brent NeerRandy L Lang [id number] 12/30/1955 66 y.o.  Subjective:   Patient ID:  [name] is a 66 y.o. (DOB 01/23/1956) male.  Chief Complaint:  Chief Complaint  Patient presents with   Follow-up   Anxiety    HPI [name] presents to the office today for follow-up of generalized anxiety disorder with prominent irritability as a primary symptom. First visit 01/24/21 appt started sertraline 50 mg daily.  04/04/21 appt noted: She thinks it's helping some but not enough.  He thinks some is better. Still has irritability.  Always been kind of a nervous anxious person as was mother. SE constant GI gurgling very annoying.   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  That is a welcome benefit. Plan: Switch back to paroxetine at 20 mg daily DT GI SE sertraline 50 over 7 days.  06/05/2021 appointment with the following noted: Ok but knocks out sex drive and a little constipation.  Also gets a little shakey Anxiety and irritability are a little better but not gone.  Occ snappy.   Chronic GI sx are only a little better with paroxetine vs sertraline.    Past Psychiatric History: Dr. Jennelle Lang from 2007-2017 Past Psychiatric Medication Trials: Zolpidem poor response Lunesta 2 effective Clonazepam 0.5 mg Paroxetine 10, citalopram 80, sertraline 100, Effexor 37.5,  Wellbutrin 150, Depakote fatigue Adderall, Ritalin Geodon panic, Seroquel hangover Lithium SE  Review of Systems:  Review of Systems  Genitourinary:        Sexual SE  Musculoskeletal:  Positive for arthralgias and back pain.  Neurological:        Neuropathy   Medications: I have reviewed the patient's current medications.  Current Outpatient Medications  Medication Sig Dispense Refill   acetaminophen (TYLENOL) 500 MG tablet Take 500-1,000 mg by mouth every 6 (six) hours as needed (for pain.).     amLODipine (NORVASC) 10 MG tablet TAKE 1 TABLET BY MOUTH DAILY 90 tablet 3   azelastine (ASTELIN) 0.1 % nasal spray Place 1 spray  into both nostrils 2 (two) times daily as needed for rhinitis or allergies. Use in each nostril as directed     clopidogrel (PLAVIX) 75 MG tablet TAKE 1 TABLET BY MOUTH DAILY 90 tablet 3   Coenzyme Q10 (CO Q-10) 300 MG CAPS Take 300 mg by mouth daily. 90 each 11   finasteride (PROSCAR) 5 MG tablet Take 5 mg by mouth daily.     FLUoxetine (PROZAC) 20 MG capsule Take 1 capsule (20 mg total) by mouth daily. 90 capsule 0   fluticasone (FLONASE) 50 MCG/ACT nasal spray Place 1 spray into the nose daily as needed for allergies. For allergies     niacinamide 500 MG tablet Take 500 mg by mouth 2 (two) times daily with a meal. Taking only once a day for precancerous scalp lesion     nitroGLYCERIN (NITROSTAT) 0.4 MG SL tablet Place 1 tablet (0.4 mg total) under the tongue every 5 (five) minutes as needed for chest pain. 25 tablet 4   pantoprazole (PROTONIX) 40 MG tablet Take 40 mg by mouth daily.     PARoxetine (PAXIL) 20 MG tablet Take 1 tablet (20 mg total) by mouth daily. 90 tablet 0   rosuvastatin (CRESTOR) 20 MG tablet TAKE 1 TABLET BY MOUTH DAILY 90 tablet 3   valsartan (DIOVAN) 80 MG tablet Take 1 tablet (80 mg total) by mouth daily. Keep upcoming appointments for future refills. 90 tablet 0   No current facility-administered medications for this visit.  Medication Side Effects: GI and some sexual reduced interest  Allergies: No Known Allergies  Past Medical History:  Diagnosis Date   Anxiety    Arthritis    "mostly in my hands; sometimes other areas" (10/20/2013)   Bipolar disorder (HCC)    BPH (benign prostatic hyperplasia)    CAD S/P percutaneous coronary angioplasty 3/'13, 7/'15; 3/'21   (Therapeutic P2Y12 on Plavix); a) STEMI -m RCA DES PCI; 2015: Class III angina (abnl MYOVIEW w/ Ant-AntLat ischemia) -> m-d LAD 95-99% (PCI: Biotronik AG 2 DES 2.5 x 13, 2.75 mm); b. 3/'21 Cath/Staged PCI: LM nl, mLAD 60% (DFR .96), D1 65%  (DFR .95), mCx 60% (DFR .96); pRCA 60% - m-d 85&70% (CSI ->  3.5x38 Resolute DES p-m, 3.0x8 Resolute DES m-d connecting new & old  STENTS). EF 55-65%.   Depression    Dizziness of unknown cause January 2011   abn MRI, Neuro w/u Jan 2011   Dyslipidemia, goal LDL below 70    Essential hypertension    GERD (gastroesophageal reflux disease)    RA (rheumatoid arthritis) (HCC)    Rheumatic fever 1969   ST elevation myocardial infarction (STEMI) involving right coronary artery in recovery phase (HCC) 06/2011   100% occluded RCA; PCI - 3.0x24 Promus DES (3.5-3.6 mm) ; Mod w moderate LAD & Cx disease, EF 55-60% w/ basal inferoseptal HK confirmed by echo    Past Medical History, Surgical history, Social history, and Family history were reviewed and updated as appropriate.   Please see review of systems for further details on the patient's review from today.   Objective:   Physical Exam:  There were no vitals taken for this visit.  Physical Exam Constitutional:      General: He is not in acute distress. Musculoskeletal:        General: No deformity.  Neurological:     Mental Status: He is alert and oriented to person, place, and time.     Coordination: Coordination normal.  Psychiatric:        Attention and Perception: Attention and perception normal. He does not perceive auditory or visual hallucinations.        Mood and Affect: Mood is anxious. Mood is not depressed. Affect is not labile, blunt, angry, tearful or inappropriate.        Speech: Speech normal.        Behavior: Behavior normal.        Thought Content: Thought content normal. Thought content is not paranoid or delusional. Thought content does not include homicidal or suicidal ideation. Thought content does not include suicidal plan.        Cognition and Memory: Cognition and memory normal.        Judgment: Judgment normal.     Comments: Insight intact Still some irritability and anxiety but improved.    Lab Review:     Component Value Date/Time   NA 139 06/06/2019 0341   NA 141  05/20/2019 1642   K 3.8 06/06/2019 0341   CL 104 06/06/2019 0341   CO2 27 06/06/2019 0341   GLUCOSE 81 06/06/2019 0341   BUN 13 06/06/2019 0341   BUN 21 05/20/2019 1642   CREATININE 0.92 06/06/2019 0341   CREATININE 0.86 05/09/2016 1039   CALCIUM 8.9 06/06/2019 0341   PROT 6.9 05/09/2016 1039   ALBUMIN 4.8 05/09/2016 1039   AST 39 (H) 05/09/2016 1039   ALT 51 (H) 05/09/2016 1039   ALKPHOS 84 05/09/2016 1039   BILITOT 1.0 05/09/2016 1039  GFRNONAA >60 06/06/2019 0341   GFRNONAA >89 10/16/2013 1455   GFRAA >60 06/06/2019 0341   GFRAA >89 10/16/2013 1455       Component Value Date/Time   WBC 6.1 06/06/2019 0341   RBC 4.35 06/06/2019 0341   HGB 13.3 06/06/2019 0341   HGB 15.4 05/20/2019 1642   HCT 38.0 (L) 06/06/2019 0341   HCT 42.0 05/20/2019 1642   PLT 154 06/06/2019 0341   PLT 173 05/20/2019 1642   MCV 87.4 06/06/2019 0341   MCV 84 05/20/2019 1642   MCH 30.6 06/06/2019 0341   MCHC 35.0 06/06/2019 0341   RDW 12.6 06/06/2019 0341   RDW 13.1 05/20/2019 1642    No results found for: POCLITH, LITHIUM   No results found for: PHENYTOIN, PHENOBARB, VALPROATE, CBMZ   .res Assessment: Plan:    [name] was seen today for follow-up and anxiety.  Diagnoses and all orders for this visit:  Generalized anxiety disorder -     FLUoxetine (PROZAC) 20 MG capsule; Take 1 capsule (20 mg total) by mouth daily.  Wife sees some degree of improvement with sertraline 50 mg in terms of his irritability.  He does not notice much change.  However he complains of side effects from the sertraline 50 mg with GI problems.  He did rather switch back to the paroxetine which he had taken in the past. Interestingly he is seen improvement in her neuropathy symptoms on the sertraline and would like to keep that benefit.  We will observe for the change after switching him back to paroxetine  Switch to fluoxetine 20 mg daily DT sexual SE paroxetine (Start fluoxetine 1 daily and reduce paroxetine to 1/2  daily for 1 week then stop paroxetine)  Disc constipation management.    Fu 8 weeks.  Meredith Staggers, MD, DFAPA    Please see After Visit Summary for patient specific instructions.  Future Appointments  Date Time Provider Department Center  07/17/2021  3:00 PM Marykay Lex, MD [facility]     No orders of the defined types were placed in this encounter.   -------------------------------

## 2021-07-17 ENCOUNTER — Ambulatory Visit (INDEPENDENT_AMBULATORY_CARE_PROVIDER_SITE_OTHER): Payer: Medicare Other | Admitting: Cardiology

## 2021-07-17 ENCOUNTER — Encounter: Payer: Self-pay | Admitting: Cardiology

## 2021-07-17 VITALS — BP 148/98 | HR 62 | Ht 70.0 in | Wt 185.2 lb

## 2021-07-17 DIAGNOSIS — I2510 Atherosclerotic heart disease of native coronary artery without angina pectoris: Secondary | ICD-10-CM

## 2021-07-17 DIAGNOSIS — I208 Other forms of angina pectoris: Secondary | ICD-10-CM

## 2021-07-17 DIAGNOSIS — Z9861 Coronary angioplasty status: Secondary | ICD-10-CM | POA: Diagnosis not present

## 2021-07-17 DIAGNOSIS — I2119 ST elevation (STEMI) myocardial infarction involving other coronary artery of inferior wall: Secondary | ICD-10-CM | POA: Diagnosis not present

## 2021-07-17 DIAGNOSIS — I25119 Atherosclerotic heart disease of native coronary artery with unspecified angina pectoris: Secondary | ICD-10-CM | POA: Diagnosis not present

## 2021-07-17 DIAGNOSIS — I10 Essential (primary) hypertension: Secondary | ICD-10-CM

## 2021-07-17 DIAGNOSIS — E785 Hyperlipidemia, unspecified: Secondary | ICD-10-CM

## 2021-07-17 DIAGNOSIS — R5383 Other fatigue: Secondary | ICD-10-CM

## 2021-07-17 DIAGNOSIS — Z955 Presence of coronary angioplasty implant and graft: Secondary | ICD-10-CM

## 2021-07-17 NOTE — Assessment & Plan Note (Addendum)
Labs were last checked in February.  Unfortunately, his LDL is still in the 80s.  I would like to see it less than 70.  Because of his fatigue issues I am leery of pushing too high on the statin.  We will add Zetia 10 mg daily and reassess labs in about 3 months.  This will allow [place] to also reassess his chemistries after adjusting (or restarting) ARB dose. ? ?Plan: Add Zetia 10 mg daily to current dose of rosuvastatin. ?Recheck lipids in 3 months and reassess in clinic ?

## 2021-07-17 NOTE — Assessment & Plan Note (Signed)
Much better and stable after stopping beta-blocker and actually treating his stress/anxiety/depression with Prozac ?

## 2021-07-17 NOTE — Patient Instructions (Addendum)
Medication Instructions:  ?  ? CHECK TO SEE IF YOU ARE TAKING VALSARTAN  WILL WANT TO INCREASE TO 160 MG  ?IF NOT YOU WILL RESTART TAKING 80 MG DAILY  ? ?CALL THE OFFICE. ?*If you need a refill on your cardiac medications before your next appointment, please call your pharmacy* ? ( On 07/18/21 - patient called back to the office to confirm that he is taking valsartan 80 mg .  Per verbal order from Dr Herbie Baltimore , Valsartan  160 mg  - one tablet daily.   Medication e-sent to pharmacy . Patient verbalized understanding.)  ? ?Lab Work: 3 MONTHS  FASTING  ? CMP ?LIPID ?If you have labs (blood work) drawn today and your tests are completely normal, you will receive your results only by: ?MyChart Message (if you have MyChart) OR ?A paper copy in the mail ?If you have any lab test that is abnormal or we need to change your treatment, we will call you to review the results. ? ? ?Testing/Procedures: ? Not needed ? ? ?Follow-Up: ?At [facility], you and your health needs are our priority.  As part of our continuing mission to provide you with exceptional heart care, we have created designated Provider Care Teams.  These Care Teams include your primary Cardiologist (physician) and Advanced Practice Providers (APPs -  Physician Assistants and Nurse Practitioners) who all work together to provide you with the care you need, when you need it. ? ?We recommend signing up for the patient portal called "MyChart".  Sign up information is provided on this After Visit Summary.  MyChart is used to connect with patients for Virtual Visits (Telemedicine).  Patients are able to view lab/test results, encounter notes, upcoming appointments, etc.  Non-urgent messages can be sent to your provider as well.   ?To learn more about what you can do with MyChart, go to [email or web address].   ? ?Your next appointment:   ?3 to 4  month(s) ? ?The format for your next appointment:   ?In Person ? ?Provider:   ?[name], MD   ? ? ?Other  Instructions ? ? ?Important Information About Sugar ? ? ? ? ?  ?

## 2021-07-17 NOTE — Progress Notes (Signed)
? ?Primary Care Provider: Rolm Gala, NP ?Cardiologist: [name], MD ?Electrophysiologist: None ? ?Clinic Note: ?Chief Complaint  ?Patient presents with  ?? Follow-up  ?  Doing well with annual follow-up.  Fatigue and other issues seem to stabilize with Zoloft.  ?? Coronary Artery Disease  ?  No angina since most recent PCI.  ? ?=================================== ? ?ASSESSMENT/PLAN  ? ?Problem List Items Addressed This Visit   ? ?  ? Cardiology Problems  ? 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  Initial MI presentation was in 2013.  He has subsequently had 2 episodes in 2015 and then March 2021 crescendo angina.  Preserved EF on echo.  No significant wall motion abnormalities after recovery from MI.  Only reported grade 1 diastolic function. ? ?Finally symptoms symptoms stabilized with control of his depression and anxiety. ? ? ? ?  ?  ? Stable angina (HCC) atypical symptoms manifested as exertional dyspnea, fatigue and sweating (Chronic)  ?  Following most recent PCI, really down to class 0 angina. ?Remains on amlodipine without beta-blocker because of fatigue concerns. ?Refill nitroglycerin, but has not had to use since his PCI. ? ?  ?  ? Relevant Medications  ? traMADol (ULTRAM) 50 MG tablet  ? Coronary artery disease involving native coronary artery of native heart with angina pectoris (HCC) - Primary (Chronic)  ?  He had a UTI leading up to his most recent PCI, now no longer having angina after the most recent PCI ? ?Not on beta-blocker because of fatigue.  Unsure if he is or is not taking ARB-we will need to confirm initiate either at previous dose if he has not been taking, or increase to double dose if he has been taking. ? ?On moderate low-dose rosuvastatin and co-Q10 along with standing maintenance clopidogrel. ? ?The fact that he has not had any symptoms since last PCI is reassuring.  Would like to do a better job with BP control-see elsewhere. ? ?  ?   ? Relevant Orders  ? EKG 12-Lead  ? Lipid panel  ? Comprehensive metabolic panel  ? Essential hypertension (Chronic)  ?  Blood pressure is high today.  He had been on losartan but he is not sure if he is taking it or not.  He is on 10 mg of Norvasc by itself otherwise. ? ?Need to confirm if he is or is not taking valsartan.  If he is taking any milligram valsartan and will increase to 160, if he is not taking we will restart at 80 mg. ? ?  ?  ? CAD S/P PCI to RCA (2013, 2021) and LAD (2015); therapeutic P2Y12 for Plavix (Chronic)  ?  Now has 3 overlapping stents in the RCA.  No recurrent symptoms.  However we planned for long term interruptible completing her elective PCI.  Thankfully, no bleeding issues. ? ?At this point okay to hold clopidogrel 75 mg - For surgical procedures 5 x 7 days.  For bleeding issues until bleeding is stabilized ?  ?  ? Relevant Orders  ? EKG 12-Lead  ? Lipid panel  ? Comprehensive metabolic panel  ? Dyslipidemia, goal LDL below 70 (Chronic)  ?  Labs were last checked in February.  Unfortunately, his LDL is still in the 80s.  I would like to see it less than 70.  Because of his fatigue issues I am leery of pushing too high on the statin.  We will add Zetia 10 mg  daily and reassess labs in about 3 months.  This will allow [place] to also reassess his chemistries after adjusting (or restarting) ARB dose. ? ?Plan: Add Zetia 10 mg daily to current dose of rosuvastatin. ?Recheck lipids in 3 months and reassess in clinic ? ?  ?  ? Relevant Orders  ? Lipid panel  ? Comprehensive metabolic panel  ?  ? Other  ? Presence of drug coated stent in LAD coronary artery: Biotronik AG DES 2.5 mm x 13 mm (2.75 mm) (Chronic)  ?  3 overlapping DES stents.  No longer on DAPT, on maintenance Plavix. ? ?Okay to hold clopidogrel/Plavix 5 to 7 days preop for surgical procedures. ?Okay to hold for significant bleeding or bruising for 3 to 4 days ?  ?  ? Fatigue due to treatment (Chronic)  ?  Much better and stable  after stopping beta-blocker and actually treating his stress/anxiety/depression with Prozac ? ?  ?  ? ? ?=================================== ? ?HPI:   ? ?[name] is a 66 y.o. male with a Cardiac PMH reviewed below who presents today for annual follow-up.  He is being seen today for follow-up at the request of Rolm Gala, NP. ? ?CAD History:  ?Inferior STEMI MI -> PCI to the RCA in 2013.   ?Crescendo Angina in July 2015 -> abnormal Myoview --> cath showed severe mid LAD disease treated with DES stent. Also had D1 and circumflex disease evaluated with FFR --> nonsignificant. Medical therapy. ?Crescendo Angina March 2021: (Myoview in February 2020 showed inferior ischemia -> cath delayed due to COVID-19 restrictions) ->  ?(06/02/2019), CATH: moderate lesions in the LCA => all negative by DFR, extensive mid-distal RCA disease, calcified prior to previous stent-- ?(06/05/2019), STAGED CSI DES PCI 2 additional proximal overlapping stents that overlap the distal/original stent. ->  Reviewed below ? ?[name] was last seen on July 04, 2020 for post PCI follow-up after atherectomy PCI to the RCA.  Was doing well.  More like class 0 angina 1.  Previously had more class I symptoms.  Tolerating high-dose amlodipine because of beta-blocker intolerance.  BP was pretty well controlled.  Plan with 3 overlapping stents was long-term clopidogrel. ? ?Recent Hospitalizations: None ? ?Reviewed  CV studies:   ? ?The following studies were reviewed today: (if available, images/films reviewed: From Epic Chart or Care Everywhere) ?None: ? ?Interval History:  ? ?[name] returns today actually doing pretty well.  He says that his insomnia issues and mild nagging fatigue has really stabilized out now that he is on the Zoloft.  He is not exercising, but he is doing more activity.  Does not what 1 would describe as routine exercise.  As a former high school track athlete, he is somewhat surprised at how he has not wanted  to get back and exercising.  He just seems to be a little on the left side.  However with what he is able to do activity wise, he denies any chest pain or pressure.  No CHF symptoms.  No arrhythmia symptoms.  No bleeding issues. ? ?CV Review of Symptoms (Summary) ?Cardiovascular ROS claudication.  no chest pain or dyspnea on exertion ?positive for - edema and a few months ago, he had short little he had some leg swelling, but no more.  Probably related to some dietary indiscretion. ?negative for - chest pain, dyspnea on exertion, irregular heartbeat, orthopnea, palpitations, paroxysmal nocturnal dyspnea, rapid heart rate, shortness of breath, or syncope or near syncope, TIA/amaurosis  fugax; claudication. ? ?REVIEWED OF SYSTEMS  ? ?Review of Systems  ?Constitutional:  Positive for weight loss (Not sure if totally intentional, but he is not eating as much.  Still not exercising.). Negative for malaise/fatigue (Energy level actually seems to be better.  He still is not all active, but not fatigue.).  ?HENT:  Negative for congestion and nosebleeds.   ?Respiratory:  Negative for cough, shortness of breath and wheezing.   ?Gastrointestinal:  Negative for abdominal pain and constipation.  ?Genitourinary:  Negative for hematuria.  ?Musculoskeletal:  Positive for joint pain (Hands still get stiff and cold).  ?Neurological:  Positive for dizziness (Rare). Negative for tingling (Not describing a tingling sensation), seizures and weakness.  ?Endo/Heme/Allergies:  Bruises/bleeds easily.  ?Psychiatric/Behavioral:  Negative for depression, hallucinations, memory loss and substance abuse. The patient is not nervous/anxious.   ?     He seems to have really stabilized out on Zoloft after trying Paxil and Prozac.  This seems of taking the edge off.  Less anxiety and depression symptoms.  Also sleeping better.  ? ?I have reviewed and (if needed) personally updated the patient's problem list, medications, allergies, past medical and  surgical history, social and family history.  ? ?PAST MEDICAL HISTORY  ? ?Past Medical History:  ?Diagnosis Date  ?? Anxiety   ?? Arthritis   ? "mostly in my hands; sometimes other areas" (10/20/2013)  ?? Bip

## 2021-07-17 NOTE — Assessment & Plan Note (Signed)
Following most recent PCI, really down to class 0 angina. ?Remains on amlodipine without beta-blocker because of fatigue concerns. ?Refill nitroglycerin, but has not had to use since his PCI. ?

## 2021-07-17 NOTE — Assessment & Plan Note (Signed)
3 overlapping DES stents.  No longer on DAPT, on maintenance Plavix. ? ?? Okay to hold clopidogrel/Plavix 5 to 7 days preop for surgical procedures. ?? Okay to hold for significant bleeding or bruising for 3 to 4 days ?

## 2021-07-17 NOTE — Assessment & Plan Note (Signed)
He had a UTI leading up to his most recent PCI, now no longer having angina after the most recent PCI ? ?Not on beta-blocker because of fatigue.  Unsure if he is or is not taking ARB-we will need to confirm initiate either at previous dose if he has not been taking, or increase to double dose if he has been taking. ? ?On moderate low-dose rosuvastatin and co-Q10 along with standing maintenance clopidogrel. ? ?The fact that he has not had any symptoms since last PCI is reassuring.  Would like to do a better job with BP control-see elsewhere. ?

## 2021-07-17 NOTE — Assessment & Plan Note (Signed)
Blood pressure is high today.  He had been on losartan but he is not sure if he is taking it or not.  He is on 10 mg of Norvasc by itself otherwise. ? ?Need to confirm if he is or is not taking valsartan.  If he is taking any milligram valsartan and will increase to 160, if he is not taking we will restart at 80 mg. ?

## 2021-07-17 NOTE — Assessment & Plan Note (Signed)
Now has 3 overlapping stents in the RCA.  No recurrent symptoms.  However we planned for long term interruptible completing her elective PCI.  Thankfully, no bleeding issues. ? ?? At this point okay to hold clopidogrel 75 mg - For surgical procedures 5 x 7 days.  For bleeding issues until bleeding is stabilized ?

## 2021-07-17 NOTE — Assessment & Plan Note (Signed)
Initial MI presentation was in 2013.  He has subsequently had 2 episodes in 2015 and then March 2021 crescendo angina.  Preserved EF on echo.  No significant wall motion abnormalities after recovery from MI.  Only reported grade 1 diastolic function. ? ?Finally symptoms symptoms stabilized with control of his depression and anxiety. ? ? ?

## 2021-07-18 ENCOUNTER — Telehealth: Payer: Self-pay | Admitting: *Deleted

## 2021-07-18 MED ORDER — VALSARTAN 160 MG PO TABS: 160.0000 mg | ORAL_TABLET | Freq: Every day | ORAL | 3 refills | Status: DC

## 2021-07-18 NOTE — Telephone Encounter (Signed)
-   patient called back to the office to confirm that he is taking valsartan 80 mg  daily.  Per verbal order from Dr Ellyn Hack , Valsartan  160 mg  - one tablet daily.   ? ?Addendum medication addition  was changed on 07/17/21 office visit ? Medication e-sent to pharmacy. Patient verbalized understanding.  ?

## 2021-07-18 NOTE — Addendum Note (Signed)
Addended by: Tobin Chad on: 07/18/2021 10:36 AM ? ? Modules accepted: Orders ? ?

## 2021-08-07 ENCOUNTER — Ambulatory Visit: Payer: Medicare Other | Admitting: Psychiatry

## 2021-08-29 ENCOUNTER — Ambulatory Visit (INDEPENDENT_AMBULATORY_CARE_PROVIDER_SITE_OTHER): Payer: Medicare Other | Admitting: Psychiatry

## 2021-08-29 ENCOUNTER — Encounter: Payer: Self-pay | Admitting: Psychiatry

## 2021-08-29 DIAGNOSIS — F411 Generalized anxiety disorder: Secondary | ICD-10-CM | POA: Diagnosis not present

## 2021-08-29 DIAGNOSIS — I2510 Atherosclerotic heart disease of native coronary artery without angina pectoris: Secondary | ICD-10-CM | POA: Diagnosis not present

## 2021-08-29 DIAGNOSIS — F5105 Insomnia due to other mental disorder: Secondary | ICD-10-CM

## 2021-08-29 DIAGNOSIS — Z9861 Coronary angioplasty status: Secondary | ICD-10-CM | POA: Diagnosis not present

## 2021-08-29 MED ORDER — FLUOXETINE HCL 20 MG PO CAPS: 20.0000 mg | ORAL_CAPSULE | Freq: Every day | ORAL | 1 refills | Status: DC

## 2021-08-29 NOTE — Progress Notes (Signed)
[name] [id number] 05/20/1955 66 y.o.  Subjective:   Patient ID:  [name] is a 66 y.o. (DOB 1955/04/06) male.  Chief Complaint:  Chief Complaint  Patient presents with   Follow-up   Anxiety   Medication Reaction    HPI [name] presents to the office today for follow-up of generalized anxiety disorder with prominent irritability as a primary symptom. First visit 01/24/21 appt started sertraline 50 mg daily.  04/04/21 appt noted: She thinks it's helping some but not enough.  He thinks some is better. Still has irritability.  Always been kind of a nervous anxious person as was mother. SE constant GI gurgling very annoying.   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  That is a welcome benefit. Plan: Switch back to paroxetine at 20 mg daily DT GI SE sertraline 50 over 7 days.  06/05/2021 appointment with the following noted: [place] but knocks out sex drive and a little constipation.  Also gets a little shakey Anxiety and irritability are a little better but not gone.  Occ snappy.   Chronic GI sx are only a little better with paroxetine vs sertraline. Plan: Switch to fluoxetine 20 mg daily DT sexual SE paroxetine (Start fluoxetine 1 daily and reduce paroxetine to 1/2 daily for 1 week then stop paroxetine)  08/29/2021 appointment with the following noted: Doing good. Took awhile to help anger but less sexual Se than paroxetine. Anxiety and irritability manageable and better with meds. No sign SE Paxil helped neuropathy better.. Wants refill Lunesta. Sleep pretty good with Lunesta.   8 hours sleep without SE  Mind won't turn off without some sleeper and feels bad next day. Asked questions about Quiviviq.   Past Psychiatric History: Dr. Clovis Pu from 2007-2017 Past Psychiatric Medication Trials: Zolpidem poor response, Ambien CR hangover Lunesta 2 effective Clonazepam 0.5 mg Paroxetine 10, citalopram 80, sertraline 100, Effexor 37.5,  Wellbutrin 150, Depakote  fatigue Adderall, Ritalin Geodon panic, Seroquel hangover Lithium SE  Review of Systems:  Review of Systems  Genitourinary:        Sexual SE  Musculoskeletal:  Positive for arthralgias and back pain.  Neurological:        Neuropathy  Psychiatric/Behavioral:  Positive for sleep disturbance.    Medications: I have reviewed the patient's current medications.  Current Outpatient Medications  Medication Sig Dispense Refill   acetaminophen (TYLENOL) 500 MG tablet Take 500-1,000 mg by mouth every 6 (six) hours as needed (for pain.).     amLODipine (NORVASC) 10 MG tablet TAKE 1 TABLET BY MOUTH DAILY 90 tablet 3   azelastine (ASTELIN) 0.1 % nasal spray Place 1 spray into both nostrils 2 (two) times daily as needed for rhinitis or allergies. Use in each nostril as directed     clopidogrel (PLAVIX) 75 MG tablet TAKE 1 TABLET BY MOUTH DAILY 90 tablet 3   Coenzyme Q10 (CO Q-10) 300 MG CAPS Take 300 mg by mouth daily. 90 each 11   eszopiclone (LUNESTA) 2 MG TABS tablet Take 2 mg by mouth at bedtime.     finasteride (PROSCAR) 5 MG tablet Take 5 mg by mouth daily.     fluticasone (FLONASE) 50 MCG/ACT nasal spray Place 1 spray into the nose daily as needed for allergies. For allergies     niacinamide 500 MG tablet Take 500 mg by mouth daily in the afternoon.     pantoprazole (PROTONIX) 40 MG tablet Take 40 mg by mouth daily.     rosuvastatin (CRESTOR) 20 MG tablet  TAKE 1 TABLET BY MOUTH DAILY 90 tablet 3   traMADol (ULTRAM) 50 MG tablet Take 50 mg by mouth. Take 1 tablet every 6 hours     valsartan (DIOVAN) 160 MG tablet Take 1 tablet (160 mg total) by mouth daily. 90 tablet 3   FLUoxetine (PROZAC) 20 MG capsule Take 1 capsule (20 mg total) by mouth daily. 90 capsule 1   nitroGLYCERIN (NITROSTAT) 0.4 MG SL tablet Place 1 tablet (0.4 mg total) under the tongue every 5 (five) minutes as needed for chest pain. (Patient not taking: Reported on 07/17/2021) 25 tablet 4   triamcinolone cream (KENALOG) 0.1 %  SMARTSIG:1 Application Topical 2-3 Times Daily (Patient not taking: Reported on 07/17/2021)     No current facility-administered medications for this visit.    Medication Side Effects: GI and some sexual reduced interest  Allergies: No Known Allergies  Past Medical History:  Diagnosis Date   Anxiety    Arthritis    "mostly in my hands; sometimes other areas" (10/20/2013)   Bipolar disorder (HCC)    BPH (benign prostatic hyperplasia)    CAD S/P percutaneous coronary angioplasty 3/'13, 7/'15; 3/'21   (Therapeutic P2Y12 on Plavix); a) STEMI -m RCA DES PCI; 2015: Class III angina (abnl MYOVIEW w/ Ant-AntLat ischemia) -> m-d LAD 95-99% (PCI: Biotronik AG 2 DES 2.5 x 13, 2.75 mm); b. 3/'21 Cath/Staged PCI: LM nl, mLAD 60% (DFR .96), D1 65%  (DFR .95), mCx 60% (DFR .96); pRCA 60% - m-d 85&70% (CSI -> 3.5x38 Resolute DES p-m, 3.0x8 Resolute DES m-d connecting new & old  STENTS). EF 55-65%.   Depression    Dizziness of unknown cause January 2011   abn MRI, Neuro w/u Jan 2011   Dyslipidemia, goal LDL below 70    Essential hypertension    GERD (gastroesophageal reflux disease)    RA (rheumatoid arthritis) (HCC)    Rheumatic fever 1969   ST elevation myocardial infarction (STEMI) involving right coronary artery in recovery phase ([place]) 06/2011   100% occluded RCA; PCI - 3.0x24 Promus DES (3.5-3.6 mm) ; Mod w moderate LAD & Cx disease, EF 55-60% w/ basal inferoseptal HK confirmed by echo    Past Medical History, Surgical history, Social history, and Family history were reviewed and updated as appropriate.   Please see review of systems for further details on the patient's review from today.   Objective:   Physical Exam:  There were no vitals taken for this visit.  Physical Exam Constitutional:      General: He is not in acute distress. Musculoskeletal:        General: No deformity.  Neurological:     Mental Status: He is alert and oriented to person, place, and time.     Coordination:  Coordination normal.  Psychiatric:        Attention and Perception: Attention and perception normal. He does not perceive auditory or visual hallucinations.        Mood and Affect: Mood is anxious. Mood is not depressed. Affect is not labile, blunt, angry, tearful or inappropriate.        Speech: Speech normal.        Behavior: Behavior normal.        Thought Content: Thought content normal. Thought content is not paranoid or delusional. Thought content does not include homicidal or suicidal ideation. Thought content does not include suicidal plan.        Cognition and Memory: Cognition and memory normal.  Judgment: Judgment normal.     Comments: Insight intact irritability and anxiety improved.    Lab Review:     Component Value Date/Time   NA 139 06/06/2019 0341   NA 141 05/20/2019 1642   K 3.8 06/06/2019 0341   CL 104 06/06/2019 0341   CO2 27 06/06/2019 0341   GLUCOSE 81 06/06/2019 0341   BUN 13 06/06/2019 0341   BUN 21 05/20/2019 1642   CREATININE 0.92 06/06/2019 0341   CREATININE 0.86 05/09/2016 1039   CALCIUM 8.9 06/06/2019 0341   PROT 6.9 05/09/2016 1039   ALBUMIN 4.8 05/09/2016 1039   AST 39 (H) 05/09/2016 1039   ALT 51 (H) 05/09/2016 1039   ALKPHOS 84 05/09/2016 1039   BILITOT 1.0 05/09/2016 1039   GFRNONAA >60 06/06/2019 0341   GFRNONAA >89 10/16/2013 1455   GFRAA >60 06/06/2019 0341   GFRAA >89 10/16/2013 1455       Component Value Date/Time   WBC 6.1 06/06/2019 0341   RBC 4.35 06/06/2019 0341   HGB 13.3 06/06/2019 0341   HGB 15.4 05/20/2019 1642   HCT 38.0 (L) 06/06/2019 0341   HCT 42.0 05/20/2019 1642   PLT 154 06/06/2019 0341   PLT 173 05/20/2019 1642   MCV 87.4 06/06/2019 0341   MCV 84 05/20/2019 1642   MCH 30.6 06/06/2019 0341   MCHC 35.0 06/06/2019 0341   RDW 12.6 06/06/2019 0341   RDW 13.1 05/20/2019 1642    No results found for: POCLITH, LITHIUM   No results found for: PHENYTOIN, PHENOBARB, VALPROATE, CBMZ   .res Assessment: Plan:     [name] was seen today for follow-up, anxiety and medication reaction.  Diagnoses and all orders for this visit:  Generalized anxiety disorder -     FLUoxetine (PROZAC) 20 MG capsule; Take 1 capsule (20 mg total) by mouth daily.  Insomnia due to mental condition   Wife sees some degree of improvement with sertraline 50 mg in terms of his irritability.  He does not notice much change.  However he complains of side effects from the sertraline 50 mg with GI problems.  Sexual SE paroxetine  Switch to fluoxetine 20 mg daily DT sexual SE paroxetine was successful.  Good sx control and SE minimal.  Ok Lunesta refill when needed.  We discussed the short-term risks associated with benzodiazepines including sedation and increased fall risk among others.  Discussed long-term side effect risk including dependence, potential withdrawal symptoms, and the potential eventual dose-related risk of dementia.  But recent studies from 2020 dispute this association between benzodiazepines and dementia risk. Newer studies in 2020 do not support an association with dementia.  Fu 3 mos  Lynder Parents, MD, DFAPA    Please see After Visit Summary for patient specific instructions.  Future Appointments  Date Time Provider [place]  10/31/2021  1:30 PM Leonie Man, MD [facility]     No orders of the defined types were placed in this encounter.   -------------------------------

## 2021-10-16 LAB — LIPID PANEL
Chol/HDL Ratio: 4.2 ratio (ref 0.0–5.0)
Cholesterol, Total: 158 mg/dL (ref 100–199)
HDL: 38 mg/dL — ABNORMAL LOW (ref >39)
LDL Chol Calc (NIH): 92 mg/dL (ref 0–99)
Triglycerides: 158 mg/dL — ABNORMAL HIGH (ref 0–149)
VLDL Cholesterol Cal: 28 mg/dL (ref 5–40)

## 2021-10-16 LAB — COMPREHENSIVE METABOLIC PANEL
ALT: 29 IU/L (ref 0–44)
AST: 31 IU/L (ref 0–40)
Albumin/Globulin Ratio: 2.4 — ABNORMAL HIGH (ref 1.2–2.2)
Albumin: 5.1 g/dL — ABNORMAL HIGH (ref 3.9–4.9)
Alkaline Phosphatase: 83 IU/L (ref 44–121)
BUN/Creatinine Ratio: 20 (ref 10–24)
BUN: 18 mg/dL (ref 8–27)
Bilirubin Total: 0.7 mg/dL (ref 0.0–1.2)
CO2: 27 mmol/L (ref 20–29)
Calcium: 9.9 mg/dL (ref 8.6–10.2)
Chloride: 102 mmol/L (ref 96–106)
Creatinine, Ser: 0.9 mg/dL (ref 0.76–1.27)
Globulin, Total: 2.1 g/dL (ref 1.5–4.5)
Glucose: 105 mg/dL — ABNORMAL HIGH (ref 70–99)
Potassium: 4.9 mmol/L (ref 3.5–5.2)
Sodium: 141 mmol/L (ref 134–144)
Total Protein: 7.2 g/dL (ref 6.0–8.5)
eGFR: 94 mL/min/{1.73_m2} (ref >59)

## 2021-10-31 ENCOUNTER — Ambulatory Visit (INDEPENDENT_AMBULATORY_CARE_PROVIDER_SITE_OTHER): Payer: Medicare Other | Admitting: Cardiology

## 2021-10-31 ENCOUNTER — Encounter: Payer: Self-pay | Admitting: Cardiology

## 2021-10-31 VITALS — BP 150/88 | HR 67 | Ht 70.0 in | Wt 190.8 lb

## 2021-10-31 DIAGNOSIS — I25119 Atherosclerotic heart disease of native coronary artery with unspecified angina pectoris: Secondary | ICD-10-CM | POA: Diagnosis not present

## 2021-10-31 DIAGNOSIS — I2510 Atherosclerotic heart disease of native coronary artery without angina pectoris: Secondary | ICD-10-CM | POA: Diagnosis not present

## 2021-10-31 DIAGNOSIS — I208 Other forms of angina pectoris: Secondary | ICD-10-CM

## 2021-10-31 DIAGNOSIS — I10 Essential (primary) hypertension: Secondary | ICD-10-CM

## 2021-10-31 DIAGNOSIS — Z955 Presence of coronary angioplasty implant and graft: Secondary | ICD-10-CM | POA: Diagnosis not present

## 2021-10-31 DIAGNOSIS — E785 Hyperlipidemia, unspecified: Secondary | ICD-10-CM | POA: Diagnosis not present

## 2021-10-31 DIAGNOSIS — Z9861 Coronary angioplasty status: Secondary | ICD-10-CM | POA: Diagnosis not present

## 2021-10-31 DIAGNOSIS — R5383 Other fatigue: Secondary | ICD-10-CM

## 2021-10-31 MED ORDER — AMLODIPINE BESYLATE 10 MG PO TABS: 10.0000 mg | ORAL_TABLET | Freq: Every day | ORAL | 3 refills | Status: DC

## 2021-10-31 MED ORDER — ROSUVASTATIN CALCIUM 40 MG PO TABS: 40.0000 mg | ORAL_TABLET | Freq: Every day | ORAL | 3 refills | Status: DC

## 2021-10-31 NOTE — Progress Notes (Signed)
Primary Care Provider: Rolm Gala, NP Cardiologist: [name], MD Electrophysiologist: None  Clinic Note: Chief Complaint  Patient presents with   Follow-up    4 month.  Reassess BP   Coronary Artery Disease    No recurrent angina.   Hyperlipidemia    Lipids not adequately controlled.   ===================================  ASSESSMENT/PLAN   Problem List Items Addressed This Visit       Cardiology Problems   Coronary artery disease involving native coronary artery of native heart with angina pectoris (HCC) - Primary (Chronic)    Doing well with no current anginal symptoms.  Not on beta-blocker because of fatigue and bradycardia.  He is now on his valsartan, but ran out of the amlodipine.  We will restart.  Lipids not quite in control on recent labs with LDL going up to 92.  Plan increase rosuvastatin to 20 mg daily. Restart amlodipine/refill.      Relevant Medications   amLODipine (NORVASC) 10 MG tablet   rosuvastatin (CRESTOR) 40 MG tablet   Other Relevant Orders   Lipid panel   Comprehensive metabolic panel   AMB Referral to [facility]   Stable angina (HCC) atypical symptoms manifested as exertional dyspnea, fatigue and sweating (Chronic)    No recurrent anginal symptoms since most recent PCI.  I would like to get back on his amlodipine which were refilled for him.  Not on beta-blocker because of fatigue..  Not requiring NTG.      Relevant Medications   amLODipine (NORVASC) 10 MG tablet   rosuvastatin (CRESTOR) 40 MG tablet   Essential hypertension (Chronic)    Blood pressure remains high but has been out of his amlodipine.  We will refill amlodipine.  He can have his blood pressure reassessed when he comes in for lipid check in 3 to 4 months.  Can potentially increase valsartan up to 320 mg.  Could also potentially benefit from diuretic.      Relevant Medications   amLODipine (NORVASC) 10 MG tablet   rosuvastatin (CRESTOR) 40 MG tablet   Other  Relevant Orders   AMB Referral to [facility]   Dyslipidemia, goal LDL below 70 (Chronic)    Unfortunately his LDL is still going up higher.  We did add Zetia last visit but it is not currently on he was put on niacinamide.  Plan: Increase Crestor back to 40 mg daily.  Recheck labs in 3 to 4 months and have a follow-up with [facility] for BP and lipid check.      Relevant Medications   amLODipine (NORVASC) 10 MG tablet   rosuvastatin (CRESTOR) 40 MG tablet   Other Relevant Orders   Lipid panel   Comprehensive metabolic panel   AMB Referral to [facility]     Other   Presence of DESin RCA - Promus DES 3.0 mm x 24 mm (post-dilated to 3.33mm) (Chronic)    Extensive PCI.  On lifelong Plavix.   Okay to interrupt 5 to 7 days preop for surgical procedures.      Relevant Orders   Comprehensive metabolic panel   AMB Referral to [facility]   Presence of drug coated stent in LAD coronary artery: Biotronik AG DES 2.5 mm x 13 mm (2.75 mm) (Chronic)   Fatigue due to treatment (Chronic)    Beta-blocker discontinued because of bradycardia and fatigue.  Overall notably improved.      Relevant Orders   Comprehensive metabolic panel   ===================================  HPI:    Brent Nohr  Lang is a 66 y.o. male with a Cardiac PMH reviewed below who presents today for annual follow-up.  He is being seen today for follow-up at the request of Rolm Gala, NP.  CAD History:  Inferior STEMI MI -> PCI to the RCA in 2013.   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 Also had D1 and circumflex disease evaluated with FFR --> nonsignificant. Medical therapy. Crescendo Angina March 2021: (Myoview in February 2020 showed inferior ischemia -> Cath delayed due to COVID-19 restrictions) ->  (06/02/2019), CATH: moderate lesions in the LCA => all negative by DFR, extensive mid-distal RCA disease, calcified prior to previous  stent-- (06/05/2019), STAGED CSI DES PCI 2 additional proximal overlapping stents that overlap the distal/original stent. ->  Reviewed below  [name] was last seen on July 17 2021--noted having some mild insomnia and fatigue issues but that seemed to improve after being started on Zoloft.-Eventually switched to Prozac.  Was not yet exercising but trying to increase his activity more.  This was probably more related to anhedonia and then actually fatigue.  Was losing weight.  Not sure if it was intentional.  Hand stiffness. BP is little elevated.  Confirm that he was on Diovan.  Recent Hospitalizations: None  Reviewed  CV studies:    The following studies were reviewed today: (if available, images/films reviewed: From Epic Chart or Care Everywhere) None:  Interval History:   [name] returns today actually doing pretty well.  He had not had his amlodipine refilled for a while and his blood pressure has been running up a little bit.  Since this he has been noticing some off-and-on thumping sensation in his chest that lasts a few seconds and then but nothing prolonged no prolonged irregular heartbeats or palpitations.  He is still not all that active with exercise, but is try to do some walking.  Has some vertigo but no real lightheadedness or dizziness.  No syncope or near syncope.  No recurrent chest pain or pressure at rest or exertion.  No CHF symptoms of PND, orthopnea.  No bleeding issues.  CV Review of Symptoms (Summary) Cardiovascular ROS claudication.  no chest pain or dyspnea on exertion positive for - edema, palpitations, and a palpitations abnormal thumping sensation.  Short-lived.. negative for - chest pain, dyspnea on exertion, orthopnea, paroxysmal nocturnal dyspnea, rapid heart rate, shortness of breath, or syncope/near syncope or TIA/amaurosis fugax or claudication.  REVIEWED OF SYSTEMS   Review of Systems  Constitutional:  Negative for malaise/fatigue (Energy  level actually seems to be better.  He still is not all active, but not fatigue.) and weight loss (Weight is stabilized now.Marland Kitchen).  HENT:  Negative for congestion and nosebleeds.   Respiratory:  Negative for cough, shortness of breath and wheezing.   Gastrointestinal:  Negative for abdominal pain and constipation.  Genitourinary:  Negative for hematuria.  Musculoskeletal:  Positive for joint pain (Hands still get stiff and cold). Negative for back pain.  Neurological:  Positive for dizziness (Rare). Negative for tingling (Not describing a tingling sensation), seizures and weakness.  Endo/Heme/Allergies:  Bruises/bleeds easily.  Psychiatric/Behavioral:  Negative for depression and memory loss. The patient has insomnia. The patient is not nervous/anxious.        Seems to doing better on SSRI.    I have reviewed and (if needed) personally updated the patient's problem list, medications, allergies, past medical and surgical history, social and family  history.   PAST MEDICAL HISTORY   Past Medical History:  Diagnosis Date   Anxiety    Arthritis    "mostly in my hands; sometimes other areas" (10/20/2013)   Bipolar disorder (HCC)    BPH (benign prostatic hyperplasia)    CAD S/P percutaneous coronary angioplasty 3/'13, 7/'15; 3/'21   (Therapeutic P2Y12 on Plavix); a) STEMI -m RCA DES PCI; 2015: Class III angina (abnl MYOVIEW w/ Ant-AntLat ischemia) -> m-d LAD 95-99% (PCI: Biotronik AG 2 DES 2.5 x 13, 2.75 mm); b. 3/'21 Cath/Staged PCI: LM nl, mLAD 60% (DFR .96), D1 65%  (DFR .95), mCx 60% (DFR .96); pRCA 60% - m-d 85&70% (CSI -> 3.5x38 Resolute DES p-m, 3.0x8 Resolute DES m-d connecting new & old  STENTS). EF 55-65%.   Depression    Dizziness of unknown cause January 2011   abn MRI, Neuro w/u Jan 2011   Dyslipidemia, goal LDL below 70    Essential hypertension    GERD (gastroesophageal reflux disease)    RA (rheumatoid arthritis) (HCC)    Rheumatic fever 1969   ST elevation myocardial infarction  (STEMI) involving right coronary artery in recovery phase (HCC) 06/2011   100% occluded RCA; PCI - 3.0x24 Promus DES (3.5-3.6 mm) ; Mod w moderate LAD & Cx disease, EF 55-60% w/ basal inferoseptal HK confirmed by echo    PAST SURGICAL HISTORY   Past Surgical History:  Procedure Laterality Date   CORONARY ATHERECTOMY N/A 06/05/2019   Procedure: CORONARY ATHERECTOMY;  Surgeon: Marykay Lex, MD;  Location: [facility] INVASIVE CV LAB;  Service: Cardiovascular; orbital atherectomy-mid RCA   CORONARY STENT INTERVENTION N/A 06/05/2019   Procedure: CORONARY STENT INTERVENTION;  Surgeon: Marykay Lex, MD;  Location: [facility] INVASIVE CV LAB;  Service: Cardiovascular;; pRCA 60% - m-d 85&70% (CSI -> 3.5x38 Resolute DES p-m, 3.0x8 Resolute DES m-d connecting new & old  STENTS).  Underestimated eccentric lesion in the bend making it difficult to connect the new stent to the prior stent (was thought to be stent - but was calcification   INTRAVASCULAR PRESSURE WIRE/FFR STUDY N/A 06/02/2019   Procedure: INTRAVASCULAR PRESSURE WIRE/FFR STUDY;  Surgeon: Marykay Lex, MD;  Location: [facility] INVASIVE CV LAB;  Service: Cardiovascular;  LCA: mLAD 60% (DFR .96), D1 65%  (DFR .95), mCx 60% (DFR .96)   LEFT HEART CATH AND CORONARY ANGIOGRAPHY N/A 06/02/2019   Procedure: LEFT HEART CATH AND CORONARY ANGIOGRAPHY;  Surgeon: Marykay Lex, MD;  Location: [facility] INVASIVE CV LAB;  Service: Cardiovascular;;; LM - nml; mLAD 60% (DFR .96), D1 65%  (DFR .95), mCx 60% (DFR .96); mRCA 60%, 85%&70% (staged CSI PCI)   LEFT HEART CATHETERIZATION WITH CORONARY ANGIOGRAM N/A 06/28/2011   Procedure: LEFT HEART CATHETERIZATION WITH CORONARY ANGIOGRAM;  Surgeon: Marykay Lex, MD;  Location: [facility] CATH LAB;  50% LAD after D1, D1 30%.  Small D2.  Large caliber LCx with (small OM1) major OM 2 and 3 followed by ABG circumflex with 40% focal lesion terminating with LPL 1.  100% mid RCA (DES PCI); beyond 100% RCA  - large PDA (double barrel) and small PAV-PL -EF 55  to 60% w/ midInf-LAf HK.   LEFT HEART CATHETERIZATION WITH CORONARY ANGIOGRAM N/A 10/20/2013   Procedure: LEFT HEART CATHETERIZATION WITH CORONARY ANGIOGRAM;  Surgeon: Marykay Lex, MD;  Location: [facility] CATH LAB;  Service: Cardiovascular;  Laterality: N/A;   NM MYOVIEW LTD N/A 10/19/2013   INTERMEDIATE RISK, septal, anterior-anterolateral ischemia --> referred for cath the following day --  LAD LESION TREATED WITH DES PCI   NM MYOVIEW LTD  05/23/2018   EF estimated 45 to 50%.  Hypertensive response to exercise.  Medium sized moderate severity defect in the basal inferior mid inferolateral wall (read as intermediate risk) -> the defect actually appears to improve on stress imaging suggesting hibernating myocardium.   PERCUTANEOUS CORONARY STENT INTERVENTION (PCI-S) N/A 06/2011; 09/2013   a) 3/'13: 100%  RCA ->PCI 3.20x2 (3.6) Promus DES stent; 7/'15: mLAD PCI Biotronik AG DES 2.5 x13 2.75 mm), FFR D1 50-60% = 0.86 (small branch had ~70%), mCx ~50-60% FFR=1.0.   PLANTAR FASCIA SURGERY Left 2004   "& neuroma"   SHOULDER ARTHROSCOPY W/ ROTATOR CUFF REPAIR Left July 2011   TEMPORARY PACEMAKER N/A 06/05/2019   Procedure: TEMPORARY PACEMAKER;  Surgeon: Marykay Lex, MD;  Location: [facility] INVASIVE CV LAB;  Service: Cardiovascular;  Laterality: N/A;   TRANSTHORACIC ECHOCARDIOGRAM N/A 05/2016   EF ~55%. Normal LV Size & function. NO RWMA (inferior HK no longer present). Mlid LVH - Gr 1 DD.. Mild LA dilation.     Cardiac Cath 06/02/2019: Severe RCA disease with tandem 80 and 70% lesions, heavily calcified as likely culprit (prior to previous stent); Moderate (DFR negative) prox D1, mid LAD & mid LCx lesions with otherwise widely patent distal LAD stent; Normal LVEF and EDP.       06/05/2019 STAGED ATHERECTOMY PCI RCA: Resolute Onyx 3.0 mm 8 mm overlapping most distal stent followed by 3.5 mm x 38 mm proximal covering up to the 60% lesion. -Difficult to overlap the distal stent because of unappreciated lesion in the  crux of the RCA    There is no immunization history on file for this patient.  MEDICATIONS/ALLERGIES   Current Meds  Medication Sig   acetaminophen (TYLENOL) 500 MG tablet Take 500-1,000 mg by mouth every 6 (six) hours as needed (for pain.).   azelastine (ASTELIN) 0.1 % nasal spray Place 1 spray into both nostrils 2 (two) times daily as needed for rhinitis or allergies. Use in each nostril as directed   Coenzyme Q10 (CO Q-10) 300 MG CAPS Take 300 mg by mouth daily.   finasteride (PROSCAR) 5 MG tablet Take 5 mg by mouth daily.   FLUoxetine (PROZAC) 20 MG capsule Take 1 capsule (20 mg total) by mouth daily.   fluticasone (FLONASE) 50 MCG/ACT nasal spray Place 1 spray into the nose daily as needed for allergies. For allergies   niacinamide 500 MG tablet Take 500 mg by mouth daily in the afternoon.   nitroGLYCERIN (NITROSTAT) 0.4 MG SL tablet Place 1 tablet (0.4 mg total) under the tongue every 5 (five) minutes as needed for chest pain.   pantoprazole (PROTONIX) 40 MG tablet Take 40 mg by mouth daily.   triamcinolone cream (KENALOG) 0.1 %     amLODipine (NORVASC) 10 MG tablet TAKE 1 TABLET BY MOUTH DAILY (has been out of)    clopidogrel (PLAVIX) 75 MG tablet TAKE 1 TABLET BY MOUTH DAILY    eszopiclone (LUNESTA) 2 MG TABS tablet Take 2 mg by mouth at bedtime.    rosuvastatin (CRESTOR) 20 MG tablet TAKE 1 TABLET BY MOUTH DAILY    valsartan (DIOVAN) 160 MG tablet Take 1 tablet (160 mg total) by mouth daily.    No Known Allergies  SOCIAL HISTORY/FAMILY HISTORY   Reviewed in Epic:  Pertinent findings:  Social History   Tobacco Use   Smoking status: Never   Smokeless tobacco: Never  Substance Use Topics  Alcohol use: No   Drug use: No   Social History   Social History Narrative   Married father of 2. Never smoked. Does not drink alcohol significantly.   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   He does play golf on a fairly regular basis, but is currently not walking between holes, but riding. He is starting to play with a new partner who likes to walk, so he is hoping to get more walking.    OBJCTIVE -PE, EKG, labs   Wt Readings from Last 3 Encounters:  10/31/21 190 lb 12.8 oz (86.5 kg)  07/17/21 185 lb 3.2 oz (84 kg)  07/04/20 194 lb (88 kg)    Physical Exam: BP (!) 150/88 (BP Location: Left Arm, Patient Position: Sitting, Cuff Size: Normal)   Pulse 67   Ht  (1.778 m)   Wt 190 lb 12.8 oz (86.5 kg)   SpO2 97%   BMI 27.38 kg/m -> has not been taking amlodipine. Physical Exam Constitutional:      General: He is not in acute distress.    Appearance: Normal appearance. He is normal weight. He is not ill-appearing or toxic-appearing.     Comments: Actually quite healthy appearing.  Has lost weight.  Well-nourished and well-groomed.  HENT:     Head: Normocephalic and atraumatic.  Neck:     Vascular: No carotid bruit, hepatojugular reflux or JVD.  Cardiovascular:     Rate and Rhythm: Normal rate and regular rhythm. No extrasystoles are present.    Chest Wall: PMI is not displaced.     Pulses: Normal pulses.     Heart sounds: Normal heart sounds, S1 normal and S2 normal. No murmur heard.    No friction rub. No gallop.  Pulmonary:     Effort: Pulmonary effort is normal. No respiratory distress.     Breath sounds: Normal breath sounds. No wheezing, rhonchi or rales.  Chest:     Chest wall: No tenderness.  Musculoskeletal:        General: No swelling. Normal range of motion.     Cervical back: Normal range of motion and neck supple.  Skin:    General: Skin is warm and dry.  Neurological:     General: No focal deficit present.     Mental Status: He is alert and oriented to person, place, and time. Mental status is at baseline.     Motor: No weakness.     Gait: Gait normal.  Psychiatric:        Mood and Affect: Mood normal.         Behavior: Behavior normal.        Thought Content: Thought content normal.        Judgment: Judgment normal.     Comments: Seems more stable.  Smiling.      Adult ECG Report  Rate: 62;  Rhythm: normal sinus rhythm and 1 F AVB, left atrial lodgment, left ventricular hypertrophy, inferior, age-indeterminate ;   Narrative Interpretation: Stable  Recent Labs:   05/08/2021: TC 148, TG 158, HDL 38, LDL 83.  A1c 5.9.  Hgb 15.8.Cr 0.88, K+ 3.8. Lab Results  Component Value Date   CHOL 158 10/16/2021   HDL 38 (L) 10/16/2021   LDLCALC 92 10/16/2021   TRIG 158 (H) 10/16/2021   CHOLHDL 4.2 10/16/2021   Lab Results  Component Value  Date   CREATININE 0.90 10/16/2021   BUN 18 10/16/2021   NA 141 10/16/2021   K 4.9 10/16/2021   CL 102 10/16/2021   CO2 27 10/16/2021      Latest Ref Rng & Units 06/06/2019    3:41 AM 06/05/2019    8:45 AM 05/20/2019    4:42 PM  CBC  WBC 4.0 - 10.5 K/uL 6.1  5.5  6.0   Hemoglobin 13.0 - 17.0 g/dL 40.9  81.1  91.4   Hematocrit 39.0 - 52.0 % 38.0  44.1  42.0   Platelets 150 - 400 K/uL 154  157  173     Lab Results  Component Value Date   HGBA1C 5.5 06/28/2011   Lab Results  Component Value Date   TSH 0.841 10/16/2013    ==================================================  COVID-19 Education: The signs and symptoms of COVID-19 were discussed with the patient and how to seek care for testing (follow up with PCP or arrange E-visit).    I spent a total of 31 minutes with the patient spent in direct patient consultation.  Additional time spent with chart review  / charting (studies, outside notes, etc): 20 min Total Time: 51 min  Current medicines are reviewed at length with the patient today.  (+/- concerns)  None  This visit occurred during the SARS-CoV-2 public health emergency.  Safety protocols were in place, including screening questions prior to the visit, additional usage of staff PPE, and extensive cleaning of exam room while observing  appropriate contact time as indicated for disinfecting solutions.  Notice: This dictation was prepared with Dragon dictation along with smart phrase technology. Any transcriptional errors that result from this process are unintentional and may not be corrected upon review.  Studies Ordered:   Orders Placed This Encounter  Procedures   Lipid panel   Comprehensive metabolic panel   AMB Referral to [facility] Pharm[facility]    Patient Instructions / Medication Changes & Studies & Tests Ordered   Patient Instructions  Medication Instructions:   Increase  40 mg  Rosuvastatin daily  *If you need a refill on your cardiac medications before your next appointment, please call your pharmacy*   Lab Work: 3 months fasting  Lipid   CMP  If you have labs (blood work) drawn today and your tests are completely normal, you will receive your results only by: MyChart Message (if you have MyChart) OR A paper copy in the mail If you have any lab test that is abnormal or we need to change your treatment, we will call you to review the results.   Testing/Procedures: Not needed   Follow-Up: At [facility], you and your health needs are our priority.  As part of our continuing mission to provide you with exceptional heart care, we have created designated Provider Care Teams.  These Care Teams include your primary Cardiologist (physician) and Advanced Practice Providers (APPs -  Physician Assistants and Nurse Practitioners) who all work together to provide you with the care you need, when you need it.  We recommend signing up for the patient portal called "MyChart".  Sign up information is provided on this After Visit Summary.  MyChart is used to connect with patients for Virtual Visits (Telemedicine).  Patients are able to view lab/test results, encounter notes, upcoming appointments, etc.  Non-urgent messages can be sent to your provider as well.   To learn more about what you can do with MyChart, go to  [email or web address].    Your next appointment:  6 month(s)  The format for your next appointment:   In Person  Provider:   [name], MD     Your physician recommends that you schedule a follow-up appointment in:  3 month  with [facility]- lipid , blood pressure.     Important Information About Sugar           [name], M.D., M.S. Interventional Cardiologist   Pager # [phone] Phone # [phone] [address] [place], [place] [address]   Thank you for choosing Heartcare at [facility]!!

## 2021-10-31 NOTE — Patient Instructions (Addendum)
Medication Instructions:   Increase  40 mg  Rosuvastatin daily  *If you need a refill on your cardiac medications before your next appointment, please call your pharmacy*   Lab Work: 3 months fasting  Lipid   CMP  If you have labs (blood work) drawn today and your tests are completely normal, you will receive your results only by: MyChart Message (if you have MyChart) OR A paper copy in the mail If you have any lab test that is abnormal or we need to change your treatment, we will call you to review the results.   Testing/Procedures: Not needed   Follow-Up: At [facility], you and your health needs are our priority.  As part of our continuing mission to provide you with exceptional heart care, we have created designated Provider Care Teams.  These Care Teams include your primary Cardiologist (physician) and Advanced Practice Providers (APPs -  Physician Assistants and Nurse Practitioners) who all work together to provide you with the care you need, when you need it.  We recommend signing up for the patient portal called "MyChart".  Sign up information is provided on this After Visit Summary.  MyChart is used to connect with patients for Virtual Visits (Telemedicine).  Patients are able to view lab/test results, encounter notes, upcoming appointments, etc.  Non-urgent messages can be sent to your provider as well.   To learn more about what you can do with MyChart, go to [email or web address].    Your next appointment:   6 month(s)  The format for your next appointment:   In Person  Provider:   [name], MD     Your physician recommends that you schedule a follow-up appointment in:  3 month  with [facility]- lipid , blood pressure.     Important Information About Sugar

## 2021-11-13 ENCOUNTER — Telehealth: Payer: Self-pay | Admitting: Psychiatry

## 2021-11-13 ENCOUNTER — Other Ambulatory Visit: Payer: Self-pay | Admitting: Psychiatry

## 2021-11-13 MED ORDER — ESZOPICLONE 2 MG PO TABS: 2.0000 mg | ORAL_TABLET | Freq: Every day | ORAL | 1 refills | Status: DC

## 2021-11-13 NOTE — Telephone Encounter (Signed)
sent 

## 2021-11-13 NOTE — Telephone Encounter (Signed)
Please send

## 2021-11-13 NOTE — Telephone Encounter (Signed)
Pt called and said that dr. Jennelle Human told him he would prescribe his lunesta for him since the doctor that use to prescribe it retired. Pt takes 2 mg of lunesta. Pharmacy is [facility]

## 2021-11-21 ENCOUNTER — Telehealth: Payer: Self-pay | Admitting: Cardiology

## 2021-11-21 NOTE — Telephone Encounter (Signed)
*  STAT* If patient is at the pharmacy, call can be transferred to refill team.   1. Which medications need to be refilled? (please list name of each medication and dose if known)  pantoprazole (PROTONIX) 40 MG tablet valsartan (DIOVAN) 160 MG tablet clopidogrel (PLAVIX) 75 MG tablet   2. Which pharmacy/location (including street and city if local pharmacy) is medication to be sent to? [facility] - [place], [place] - [address]  3. Do they need a 30 day or 90 day supply? 90 day

## 2021-11-22 MED ORDER — CLOPIDOGREL BISULFATE 75 MG PO TABS: 75.0000 mg | ORAL_TABLET | Freq: Every day | ORAL | 3 refills | Status: DC

## 2021-11-22 MED ORDER — VALSARTAN 160 MG PO TABS: 160.0000 mg | ORAL_TABLET | Freq: Every day | ORAL | 3 refills | Status: DC

## 2021-11-24 ENCOUNTER — Encounter: Payer: Self-pay | Admitting: Cardiology

## 2021-11-24 NOTE — Assessment & Plan Note (Signed)
Beta-blocker discontinued because of bradycardia and fatigue.  Overall notably improved.

## 2021-11-24 NOTE — Assessment & Plan Note (Signed)
Unfortunately his LDL is still going up higher.  We did add Zetia last visit but it is not currently on he was put on niacinamide.  Plan: Increase Crestor back to 40 mg daily.  Recheck labs in 3 to 4 months and have a follow-up with CV[facility] for BP and lipid check.

## 2021-11-24 NOTE — Assessment & Plan Note (Signed)
No recurrent anginal symptoms since most recent PCI.  I would like to get back on his amlodipine which were refilled for him.  Not on beta-blocker because of fatigue..  Not requiring NTG.

## 2021-11-24 NOTE — Assessment & Plan Note (Signed)
Doing well with no current anginal symptoms.  Not on beta-blocker because of fatigue and bradycardia.  He is now on his valsartan, but ran out of the amlodipine.  We will restart.  Lipids not quite in control on recent labs with LDL going up to 92.  Plan increase rosuvastatin to 20 mg daily. Restart amlodipine/refill.

## 2021-11-24 NOTE — Assessment & Plan Note (Signed)
Blood pressure remains high but has been out of his amlodipine.  We will refill amlodipine.  He can have his blood pressure reassessed when he comes in for lipid check in 3 to 4 months.  Can potentially increase valsartan up to 320 mg.  Could also potentially benefit from diuretic.

## 2021-11-24 NOTE — Assessment & Plan Note (Signed)
   Extensive PCI.  On lifelong Plavix.    Okay to interrupt 5 to 7 days preop for surgical procedures.

## 2021-12-11 ENCOUNTER — Ambulatory Visit: Payer: Medicare Other | Admitting: Psychiatry

## 2021-12-27 ENCOUNTER — Encounter: Payer: Self-pay | Admitting: Family

## 2021-12-27 ENCOUNTER — Ambulatory Visit (INDEPENDENT_AMBULATORY_CARE_PROVIDER_SITE_OTHER): Payer: Medicare Other | Admitting: Family

## 2021-12-27 VITALS — BP 150/88 | HR 66 | Resp 16 | Ht 69.4 in | Wt 190.2 lb

## 2021-12-27 DIAGNOSIS — T63481A Toxic effect of venom of other arthropod, accidental (unintentional), initial encounter: Secondary | ICD-10-CM | POA: Diagnosis not present

## 2021-12-27 DIAGNOSIS — R0981 Nasal congestion: Secondary | ICD-10-CM

## 2021-12-27 DIAGNOSIS — I2510 Atherosclerotic heart disease of native coronary artery without angina pectoris: Secondary | ICD-10-CM

## 2021-12-27 DIAGNOSIS — Z9861 Coronary angioplasty status: Secondary | ICD-10-CM

## 2021-12-27 DIAGNOSIS — J310 Chronic rhinitis: Secondary | ICD-10-CM

## 2021-12-27 MED ORDER — RYALTRIS 665-25 MCG/ACT NA SUSP: NASAL | 5 refills | Status: DC

## 2021-12-27 NOTE — Patient Instructions (Addendum)
Non allergic rhinitis  Percutaneous and intradermal skin testing today was negative to grasses, weeds, ragweed, trees, molds, dust mite, cat, dog, and cockroach  with a good histamine response Start Afrin using 1 spray in one nostril nostril then  alternating to the other nostril at the next night. This will hopefully help you to be able to wear your cpap and prevent rebound nasal congestion by using Afrin this way Start Ryaltris 2 sprays in each nostril twice a day to help with stuffy/runny nose Continue saline rinses once or twice a day as needed for nasal symptoms. Use this prior to any medicated nasal sprays Stop Flonase and azelastine nasal sprays  Possible bee allergy We will get lab work to check for an allergy. We will call you with results once they are back.  Reflux Continue Prevacid in the morning Continue dietary and lifestyle modifications as below  Recommend scheduling an appointment with your eye doctor to discuss your burning watery eyes due to your allergy testing being negative to day.  Schedule a follow up appointment in 4-6 weeks or sooner if needed  Lifestyle Changes for Controlling GERD When you have GERD, stomach acid feels as if it's backing up toward your mouth. Whether or not you take medication to control your GERD, your symptoms can often be improved with lifestyle changes.   Raise Your Head Reflux is more likely to strike when you're lying down flat, because stomach fluid can flow backward more easily. Raising the head of your bed 4-6 inches can help. To do this: Slide blocks or books under the legs at the head of your bed. Or, place a wedge under the mattress. Many foam stores can make a suitable wedge for you. The wedge should run from your waist to the top of your head. Don't just prop your head on several pillows. This increases pressure on your stomach. It can make GERD worse.  Watch Your Eating Habits Certain foods may increase the acid in your  stomach or relax the lower esophageal sphincter, making GERD more likely. It's best to avoid the following: Coffee, tea, and carbonated drinks (with and without caffeine) Fatty, fried, or spicy food Mint, chocolate, onions, and tomatoes Any other foods that seem to irritate your stomach or cause you pain  Relieve the Pressure Eat smaller meals, even if you have to eat more often. Don't lie down right after you eat. Wait a few hours for your stomach to empty. Avoid tight belts and tight-fitting clothes. Lose excess weight.  Tobacco and Alcohol Avoid smoking tobacco and drinking alcohol. They can make GERD symptoms worse.

## 2021-12-27 NOTE — Progress Notes (Addendum)
NEW PATIENT  Date of Service/Encounter:  12/27/21  Consult requested by: No primary care provider on file.   Assessment:   Nonallergic rhinitis  Nasal congestion  Hymenoptera reaction - Plan: Allergen Hymenoptera Panel  Plan/Recommendations:    Patient Instructions   Non allergic rhinitis  Percutaneous and intradermal skin testing today was negative to grasses, weeds, ragweed, trees, molds, dust mite, cat, dog, and cockroach  with a good histamine response Start Afrin using 1 spray in one nostril nostil then  alternating to the other nostril at the next night. This will hopefully help you to be able to wear your cpap and prevent rebound nasal congestion by using Afrin this way Start Ryaltris 2 sprays in each nostril twice a day to help with stuffy/runny nose Continue saline rinses once or twice a day as needed for nasal symptoms. Use this prior to any medicated nasal sprays Stop Flonase and azelastine nasal sprays  Possible bee allergy We will get lab work to check for an allergy. We will call you with results once they are back.  Reflux Continue Prevacid in the morning Continue dietary and lifestyle modifications as below  Recommend scheduling an appointment with your eye doctor to discuss your burning watery eyes due to your allergy testing being negative to day.  Schedule a follow up appointment in 4-6 weeks or sooner if needed  Lifestyle Changes for Controlling GERD When you have GERD, stomach acid feels as if it's backing up toward your mouth. Whether or not you take medication to control your GERD, your symptoms can often be improved with lifestyle changes.   Raise Your Head Reflux is more likely to strike when you're lying down flat, because stomach fluid can flow backward more easily. Raising the head of your bed 4-6 inches can help. To do this: Slide blocks or books under the legs at the head of your bed. Or, place a wedge under the mattress. Many foam  stores can make a suitable wedge for you. The wedge should run from your waist to the top of your head. Don't just prop your head on several pillows. This increases pressure on your stomach. It can make GERD worse.  Watch Your Eating Habits Certain foods may increase the acid in your stomach or relax the lower esophageal sphincter, making GERD more likely. It's best to avoid the following: Coffee, tea, and carbonated drinks (with and without caffeine) Fatty, fried, or spicy food Mint, chocolate, onions, and tomatoes Any other foods that seem to irritate your stomach or cause you pain  Relieve the Pressure Eat smaller meals, even if you have to eat more often. Don't lie down right after you eat. Wait a few hours for your stomach to empty. Avoid tight belts and tight-fitting clothes. Lose excess weight.  Tobacco and Alcohol Avoid smoking tobacco and drinking alcohol. They can make GERD symptoms worse.    Subjective:   [name] is a 66 y.o. male presenting today for evaluation of  Chief Complaint  Patient presents with   Nasal Congestion    [name] has a history of the following: Patient Active Problem List   Diagnosis Date Noted   Elevated AST (SGOT) 05/17/2016   Elevated alanine aminotransferase (ALT) level 05/17/2016   Fatigue due to treatment 05/14/2016   Stable angina (HCC) atypical symptoms manifested as exertional dyspnea, fatigue and sweating 10/20/2013   Presence of drug coated stent in LAD coronary artery: Biotronik AG DES 2.5 mm x 13  mm (2.75 mm) 10/20/2013   CAD S/P PCI to RCA (2013, 2021) and LAD (2015); therapeutic P2Y12 for Plavix    Dyslipidemia, goal LDL below 70    Presence of DESin RCA - Promus DES 3.0 mm x 24 mm (post-dilated to 3.18mm) 06/29/2011    Class: Acute   Essential hypertension 06/28/2011   Anxiety 06/28/2011   BPH (benign prostatic hyperplasia) 06/28/2011   Coronary artery disease involving native coronary artery of native  heart with angina pectoris (HCC) 06/03/2011   History of ST elevation myocardial infarction (STEMI) of inferior wall 06/01/2011    Class: History of    History obtained from: chart review and patient and wife .  [name] was referred by No primary care provider on file.[name] is a 66 y.o. male presenting for an evaluation of nasal congestion, phlegm in throat, eyes burning and watery at night, some shortness of breath due to nasal congestion, vertigo,and not being able to use his CPAP .   Asthma/Respiratory Symptom History: He does report shortness of breath, but feels it is due to his sinuses being stopped up.  He denies coughing, wheezing, tightness in chest, and nocturnal awakenings due to breathing problems.  He has never been diagnosed with asthma or COPD and does not have an albuterol inhaler.  He is also followed by cardiology due to his heart history.  Allergic Rhinitis Symptom History: He reports a history of nasal congestion and swelling for several years.  He cannot stay lying on 1 side of his body in bed due to it swelling.  He is also not able to wear his CPAP due to the nasal congestion.  He has not worn his CPAP in a year.  He reports nasal congestion and postnasal drip where he coughs up phlegm in the morning.  He denies rhinorrhea.  He has tried using azelastine nasal spray as needed and fluticasone 2 sprays each nostril once a day.  Saline rinse seems to help some.  He has not had any sinus infections in the past few years, but used to get a sinus infection in the fall in the spring.  He has never had allergy testing or sinus surgery.  He has seen Dr. Verne Spurr, an ENT, in 2020 and he reports it was recommended that he get sinus surgery.  He did have a sinus CT in 2020 showing: " EXAM: CT PARANASAL SINUSES WITHOUT CONTRAST  TECHNIQUE: Multidetector CT images of the paranasal sinuses were obtained using the standard protocol without intravenous contrast.  COMPARISON:  08/21/2014  FINDINGS: Paranasal sinuses:  Frontal: Normally aerated. Patent frontal sinus drainage pathways.  Ethmoid: Normally aerated.  Maxillary: Normally aerated.  Sphenoid: Mild mucosal thickening in the left division. Tiny amount of sphenoid sinus fluid.  Right ostiomeatal unit: Patent.  Left ostiomeatal unit: Patent.  Nasal passages: Nasal passages somewhat narrow due to large maxillary sinuses. No specific obstructing lesion. Nasal septum bows 2 mm towards the right.  Anatomy: Pneumatization superior to both anterior ethmoid notches. Symmetric and intact olfactory grooves and fovea ethmoidalis, Keros I (1-80mm). Sellar sphenoid pneumatization pattern. No dehiscence of carotid or optic canals. No onodi cell.  Other: None  IMPRESSION: No evidence of significant sinus disease. Mild mucosal thickening in the left division of the sphenoid sinus with a tiny amount of sphenoid sinus fluid.  The sinuses in general are quite large. Nasal passages are somewhat narrow because of the large maxillary sinuses. No focal obstructing lesion however. Nasal  septum bows 2 mm towards the right.   Electronically Signed By: Paulina Fusi M.D. On: 03/07/2019 07:19"  His allergy symptoms are year-round, but notices that pressure changes make his symptoms worse.  He also reports eyes that burn or water at night.  He will use lid wipes to relieve the symptoms.  He seldom will have itching at the corners of his eyes.    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  GERD Symptom History: He denies heartburn or reflux symptoms since taking Prevacid daily in the morning.  He does note that phlegm will get stuck especially after supper.  He reports vertigo that occurs occasionally and will last for 3 days.  Vertigo is better than what it has been.  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  He reports that when he gets stung by bees he will become flushed and feel panicky.  He denies any concomitant gastrointestinal symptoms.  He does not have an epinephrine autoinjector device.  His last sting was last year or 2 years ago.  He wonders if his symptoms are due to him "mentally" wondering if he is allergic to bees.  Discussed that if his lab work is positive that we will send in a prescription for an epinephrine autoinjector device.     Past Medical History: Patient Active Problem List   Diagnosis Date Noted   Elevated AST (SGOT) 05/17/2016   Elevated alanine aminotransferase (ALT) level 05/17/2016   Fatigue due to treatment 05/14/2016   Stable angina (HCC) atypical symptoms manifested as exertional dyspnea, fatigue and sweating 10/20/2013   Presence of drug coated stent in LAD coronary artery: Biotronik AG DES 2.5 mm x 13 mm (2.75 mm) 10/20/2013   CAD S/P PCI to RCA (2013, 2021) and LAD (2015); therapeutic P2Y12 for Plavix    Dyslipidemia, goal LDL below 70    Presence of DESin RCA - Promus DES 3.0 mm x 24 mm (post-dilated to 3.20mm) 06/29/2011    Class: Acute   Essential hypertension 06/28/2011   Anxiety 06/28/2011   BPH (benign prostatic hyperplasia) 06/28/2011   Coronary artery disease involving native coronary artery of native heart with angina pectoris (HCC) 06/03/2011   History of ST elevation myocardial infarction (STEMI) of inferior wall 06/01/2011    Class: History of    Medication List:  Allergies as of 12/27/2021   No Known Allergies      Medication List        Accurate as of December 27, 2021 11:22 AM. If you have any questions, ask your nurse or doctor.          STOP taking these medications    acetaminophen 500 MG tablet Commonly known as: TYLENOL Stopped by: Nehemiah Settle, FNP   Co Q-10 300 MG Caps Stopped by: Nehemiah Settle, FNP   triamcinolone cream 0.1 % Commonly known as:  KENALOG Stopped by: Nehemiah Settle, FNP       TAKE these medications    amLODipine 10 MG tablet Commonly known as: NORVASC Take 1 tablet (10 mg total) by mouth daily.   azelastine 0.1 % nasal spray Commonly known as: ASTELIN Place 1 spray into both nostrils 2 (two) times daily as needed for rhinitis or allergies. Use in each nostril as directed   clopidogrel 75 MG tablet Commonly known as: PLAVIX Take  1 tablet (75 mg total) by mouth daily.   eszopiclone 2 MG Tabs tablet Commonly known as: LUNESTA Take 1 tablet (2 mg total) by mouth at bedtime.   finasteride 5 MG tablet Commonly known as: PROSCAR Take 5 mg by mouth daily.   FLUoxetine 20 MG capsule Commonly known as: PROzac Take 1 capsule (20 mg total) by mouth daily.   fluticasone 50 MCG/ACT nasal spray Commonly known as: FLONASE Place 1 spray into the nose daily as needed for allergies. For allergies   niacinamide 500 MG tablet Take 500 mg by mouth daily in the afternoon.   nitroGLYCERIN 0.4 MG SL tablet Commonly known as: NITROSTAT Place 1 tablet (0.4 mg total) under the tongue every 5 (five) minutes as needed for chest pain.   pantoprazole 40 MG tablet Commonly known as: PROTONIX Take 40 mg by mouth daily.   rosuvastatin 20 MG tablet Commonly known as: CRESTOR Take 20 mg by mouth daily. What changed: Another medication with the same name was removed. Continue taking this medication, and follow the directions you see here. Changed by: Nehemiah Settle, FNP   valsartan 160 MG tablet Commonly known as: DIOVAN Take 1 tablet (160 mg total) by mouth daily.       Past Surgical History: Past Surgical History:  Procedure Laterality Date   CORONARY ATHERECTOMY N/A 06/05/2019   Procedure: CORONARY ATHERECTOMY;  Surgeon: Marykay Lex, MD;  Location: [facility] INVASIVE CV LAB;  Service: Cardiovascular; orbital atherectomy-mid RCA   CORONARY STENT INTERVENTION N/A 06/05/2019   Procedure: CORONARY STENT INTERVENTION;   Surgeon: Marykay Lex, MD;  Location: [facility] INVASIVE CV LAB;  Service: Cardiovascular;; pRCA 60% - m-d 85&70% (CSI -> 3.5x38 Resolute DES p-m, 3.0x8 Resolute DES m-d connecting new & old  STENTS).  Underestimated eccentric lesion in the bend making it difficult to connect the new stent to the prior stent (was thought to be stent - but was calcification   INTRAVASCULAR PRESSURE WIRE/FFR STUDY N/A 06/02/2019   Procedure: INTRAVASCULAR PRESSURE WIRE/FFR STUDY;  Surgeon: Marykay Lex, MD;  Location: [facility] INVASIVE CV LAB;  Service: Cardiovascular;  LCA: mLAD 60% (DFR .96), D1 65%  (DFR .95), mCx 60% (DFR .96)   LEFT HEART CATH AND CORONARY ANGIOGRAPHY N/A 06/02/2019   Procedure: LEFT HEART CATH AND CORONARY ANGIOGRAPHY;  Surgeon: Marykay Lex, MD;  Location: [facility] INVASIVE CV LAB;  Service: Cardiovascular;;; LM - nml; mLAD 60% (DFR .96), D1 65%  (DFR .95), mCx 60% (DFR .96); mRCA 60%, 85%&70% (staged CSI PCI)   LEFT HEART CATHETERIZATION WITH CORONARY ANGIOGRAM N/A 06/28/2011   Procedure: LEFT HEART CATHETERIZATION WITH CORONARY ANGIOGRAM;  Surgeon: Marykay Lex, MD;  Location: [facility] CATH LAB;  50% LAD after D1, D1 30%.  Small D2.  Large caliber LCx with (small OM1) major OM 2 and 3 followed by ABG circumflex with 40% focal lesion terminating with LPL 1.  100% mid RCA (DES PCI); beyond 100% RCA  - large PDA (double barrel) and small PAV-PL -EF 55 to 60% w/ midInf-LAf HK.   LEFT HEART CATHETERIZATION WITH CORONARY ANGIOGRAM N/A 10/20/2013   Procedure: LEFT HEART CATHETERIZATION WITH CORONARY ANGIOGRAM;  Surgeon: Marykay Lex, MD;  Location[facility] CATH LAB;  Service: Cardiovascular;  Laterality: N/A;   NM MYOVIEW LTD N/A 10/19/2013   INTERMEDIATE RISK, septal, anterior-anterolateral ischemia --> referred for cath the following day -- LAD LESION TREATED WITH DES PCI   NM MYOVIEW LTD  05/23/2018   EF estimated 45 to 50%.  Hypertensive response to exercise.  Medium sized moderate severity defect in the basal  inferior mid inferolateral wall (read as intermediate risk) -> the defect actually appears to improve on stress imaging suggesting hibernating myocardium.   PERCUTANEOUS CORONARY STENT INTERVENTION (PCI-S) N/A 06/2011; 09/2013   a) 3/'13: 100%  RCA ->PCI 3.20x2 (3.6) Promus DES stent; 7/'15: mLAD PCI Biotronik AG DES 2.5 x13 2.75 mm), FFR D1 50-60% = 0.86 (small branch had ~70%), mCx ~50-60% FFR=1.0.   PLANTAR FASCIA SURGERY Left 07-28-02   "& neuroma"   SHOULDER ARTHROSCOPY W/ ROTATOR CUFF REPAIR Left July 2011   TEMPORARY PACEMAKER N/A 06/05/2019   Procedure: TEMPORARY PACEMAKER;  Surgeon: Marykay Lex, MD;  Location: [facility] INVASIVE CV LAB;  Service: Cardiovascular;  Laterality: N/A;   TRANSTHORACIC ECHOCARDIOGRAM N/A 05/2016   EF ~55%. Normal LV Size & function. NO RWMA (inferior HK no longer present). Mlid LVH - Gr 1 DD.. Mild LA dilation.      Family History: Family History  Problem Relation Age of Onset   Cancer - Colon Mother 29   Skin cancer Mother    Heart attack Father 22       open heart surgery/valve replaced   Pneumonia Father        Died from complications in 07/27/2017   Lupus Father        Diagnosed just prior to death   Heart failure Maternal Grandmother 68   Cancer Paternal Grandmother 37   Heart failure Paternal Grandfather 24     Social History: [name] lives at home with his wife and 2 cats 1 cat is outdoors part-time.  He lives in a house that is 67 years old with carpet in the family room and bedroom.  He does not have dust mite covers on his bed or pillows.  He is not exposed to tobacco or smoke in his house or car.  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  There are no roaches in the house.  He was not exposed to fumes, chemicals or dust with his job.  He does have hobbies where he is exposed to fumes, chemicals, or dust.  He does not use a HEPA filter in the home and does not live near in interstate or industrial area.  He does not smoke..    Review of Systems   Constitutional:  Negative for chills and fever.  HENT:         Reports nasal congestion and post nasal drip. Denies rhinorrhea  Eyes:        Reports burning and watery eyes at times. Reports itchy eyes at the corners at times  Respiratory:  Negative for cough, shortness of breath and wheezing.   Cardiovascular:  Positive for palpitations. Negative for chest pain.       Reports palpitations at times for which he will speak with his cardiologist and they will make medication changes  Gastrointestinal:        Denies heartburn or reflux symptoms with  prevacid in the morning  Genitourinary:  Positive for frequency.  Skin:  Positive for itching and rash.       Reports grovers disease on chest and generalized itching at times on his face.  Neurological:  Negative for headaches.       Objective:   Blood pressure (!) 150/88, pulse 66, resp. rate 16, height 5' 9.4" (1.763 m), weight 190 lb 3.2 oz (86.3 kg), SpO2 96 %. Body mass index is 27.76 kg/m.  Physical Exam Exam conducted with a chaperone present.  Constitutional:      Appearance: Normal appearance.  HENT:     Head: Normocephalic and atraumatic.     Comments: Pharynx normal. Ears normal. Eyes normal. Nose: bilateral lower turbinates moderately edematous and pale with clear nasal drainage noted    Right Ear: Tympanic membrane, ear canal and external ear normal.     Left Ear: Tympanic membrane, ear canal and external ear normal.     Mouth/Throat:     Mouth: Mucous membranes are moist.     Pharynx: Oropharynx is clear.  Eyes:     Conjunctiva/sclera: Conjunctivae normal.  Cardiovascular:     Rate and Rhythm: Normal rate and regular rhythm.     Heart sounds: Normal heart sounds.  Pulmonary:     Effort: Pulmonary effort is normal.     Breath sounds: Normal breath sounds.     Comments: Lungs clear to auscultation Musculoskeletal:     Cervical back: Neck supple.  Skin:    General: Skin is warm.  Neurological:     Mental  Status: He is alert and oriented to person, place, and time.  Psychiatric:        Mood and Affect: Mood normal.        Behavior: Behavior normal.        Thought Content: Thought content normal.        Judgment: Judgment normal.     Allergy Studies: Percutaneous and intradermal skin testing today was negative to grasses, weeds, ragweed, trees, molds, dust mite, cat, dog, and cockroach  with a good histamine response   Airborne Adult Perc - 12/27/21 1010     Time Antigen Placed 1000    Allergen Manufacturer Greer    Location Back    Number of Test 59    Panel 1 Select    1. Control-Buffer 50% Glycerol Negative    2. Control-Histamine 1 mg/ml 3+    3. Albumin saline Negative    4. Bahia Negative    5. French Southern Territories Negative    6. Johnson Negative    7. Kentucky Blue Negative    8. Meadow Fescue Negative    9. Perennial Rye Negative    10. Sweet Vernal Negative    11. Timothy Negative    12. Cocklebur Negative    13. Burweed Marshelder Negative    14. Ragweed, short Negative    15. Ragweed, Giant Negative    16. Plantain,  English Negative    17. Lamb's Quarters Negative    18. Sheep Sorrell Negative    19. Rough Pigweed Negative    20. Marsh Elder, Rough Negative    21. Mugwort, Common Negative    22. Ash mix Negative    23. Birch mix Negative    24. Beech American Negative    25. Box, Elder Negative    26. Cedar, red Negative    27. Cottonwood, Guinea-Bissau Negative    28. Elm mix Negative    29. Hickory Negative    30. Maple mix Negative    31. Oak, Guinea-Bissau mix Negative    32. Pecan Pollen Negative    33. Pine mix Negative    34. Sycamore Eastern Negative    35. Walnut, Black Pollen Negative    36. Alternaria alternata Negative    37. Cladosporium Herbarum Negative    38. Aspergillus mix Negative    39. Penicillium mix Negative    40. Bipolaris sorokiniana (Helminthosporium) Negative  41. Drechslera spicifera (Curvularia) Negative    42. Mucor plumbeus Negative     43. Fusarium moniliforme Negative    44. Aureobasidium pullulans (pullulara) Negative    45. Rhizopus oryzae Negative    46. Botrytis cinera Negative    47. Epicoccum nigrum Negative    48. Phoma betae Negative    49. Candida Albicans Negative    50. Trichophyton mentagrophytes Negative    51. Mite, D Farinae  5,000 AU/ml Negative    52. Mite, D Pteronyssinus  5,000 AU/ml Negative    53. Cat Hair 10,000 BAU/ml Negative    54.  Dog Epithelia Negative    55. Mixed Feathers Negative    56. Horse Epithelia Negative    57. Cockroach, German Negative    58. Mouse Negative    59. Tobacco Leaf Negative             Intradermal - 12/27/21 1040     Time Antigen Placed 1035    Allergen Manufacturer Waynette Buttery    Location Arm    Number of Test 15    Control Negative    French Southern Territories Negative    Johnson Negative    7 Grass Negative    Ragweed mix Negative    Weed mix Negative    Tree mix Negative    Mold 1 Negative    Mold 2 Negative    Mold 3 Negative    Mold 4 Negative    Cat Negative    Dog Negative    Cockroach Negative    Mite mix Negative            Allergy testing results were read and interpreted by myself, documented by clinician  Tera Helper, FNP  [facility]  I have provided oversight concerning NP evaluation and treatment of this patient's health issues addressed during today's encounter. I agree with the assessment and therapeutic plan as outlined in the note.   Signed,   Jessica Priest, MD,  Allergy and Immunology,  [facility] [place].

## 2021-12-28 NOTE — Addendum Note (Signed)
Addended by: Guy Franco on: 12/28/2021 06:02 PM   Modules accepted: Orders

## 2022-01-02 LAB — ALLERGEN HYMENOPTERA PANEL
Bumblebee: <0.1 kU/L
Honeybee IgE: <0.1 kU/L
Hornet, White Face, IgE: 0.16 kU/L — AB
Hornet, Yellow, IgE: <0.1 kU/L
Paper Wasp IgE: 0.36 kU/L — AB
Yellow Jacket, IgE: 0.39 kU/L — AB

## 2022-01-02 MED ORDER — EPINEPHRINE 0.3 MG/0.3ML IJ SOAJ: 0.3000 mg | INTRAMUSCULAR | 1 refills | Status: DC | PRN

## 2022-01-02 NOTE — Progress Notes (Signed)
Please let Damier know that we received his lab work back. His lab work for white faced hornet, yellow jacket and paper wasp were low, but positive.  Honeybee, yellow hornet and bumblebee are negative.   We will send in a prescription for an Epipen to have on hand. He can come by our office for instructions on when and how to use the EpiPen. Please fill out an emergency action plan for bee stings.  I would also like for him to get one additional lab to follow up on his bee allergy.  I will put in an order for tryptase. If he could go by [facility] again. We will call you with results once they are back.

## 2022-01-02 NOTE — Addendum Note (Signed)
Addended by: Althea Charon on: 01/02/2022 05:21 PM   Modules accepted: Orders

## 2022-01-09 ENCOUNTER — Other Ambulatory Visit: Payer: Self-pay | Admitting: *Deleted

## 2022-01-11 ENCOUNTER — Other Ambulatory Visit: Payer: Self-pay

## 2022-01-11 MED ORDER — ESZOPICLONE 2 MG PO TABS: 2.0000 mg | ORAL_TABLET | Freq: Every day | ORAL | 1 refills | Status: DC

## 2022-01-30 ENCOUNTER — Ambulatory Visit (INDEPENDENT_AMBULATORY_CARE_PROVIDER_SITE_OTHER): Payer: Medicare Other | Admitting: Psychiatry

## 2022-01-30 ENCOUNTER — Encounter: Payer: Self-pay | Admitting: Psychiatry

## 2022-01-30 DIAGNOSIS — Z9861 Coronary angioplasty status: Secondary | ICD-10-CM | POA: Diagnosis not present

## 2022-01-30 DIAGNOSIS — I2510 Atherosclerotic heart disease of native coronary artery without angina pectoris: Secondary | ICD-10-CM

## 2022-01-30 DIAGNOSIS — F411 Generalized anxiety disorder: Secondary | ICD-10-CM | POA: Diagnosis not present

## 2022-01-30 DIAGNOSIS — F5105 Insomnia due to other mental disorder: Secondary | ICD-10-CM

## 2022-01-30 MED ORDER — FLUOXETINE HCL 20 MG PO CAPS: 20.0000 mg | ORAL_CAPSULE | Freq: Every day | ORAL | 1 refills | Status: DC

## 2022-01-30 MED ORDER — ESZOPICLONE 2 MG PO TABS: 2.0000 mg | ORAL_TABLET | Freq: Every day | ORAL | 1 refills | Status: DC

## 2022-01-30 NOTE — Progress Notes (Signed)
[name] [id number] 03-07-1956 66 y.o.  Subjective:   Patient ID:  [name] is a 66 y.o. (DOB 12-12-1955) male.  Chief Complaint:  Chief Complaint  Patient presents with   Follow-up    Generalized anxiety disorder    HPI [name] presents to the office today for follow-up of generalized anxiety disorder with prominent irritability as a primary symptom. First visit 01/24/21 appt started sertraline 50 mg daily.  04/04/21 appt noted: She thinks it's helping some but not enough.  He thinks some is better. Still has irritability.  Always been kind of a nervous anxious person as was mother. SE constant GI gurgling very annoying.   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  That is a welcome benefit. Plan: Switch back to paroxetine at 20 mg daily DT GI SE sertraline 50 over 7 days.  06/05/2021 appointment with the following noted: Ok but knocks out sex drive and a little constipation.  Also gets a little shakey Anxiety and irritability are a little better but not gone.  Occ snappy.   Chronic GI sx are only a little better with paroxetine vs sertraline. Plan: Switch to fluoxetine 20 mg daily DT sexual SE paroxetine (Start fluoxetine 1 daily and reduce paroxetine to 1/2 daily for 1 week then stop paroxetine)  08/29/2021 appointment with the following noted: Doing good. Took awhile to help anger but less sexual Se than paroxetine. Anxiety and irritability manageable and better with meds. No sign SE Paxil helped neuropathy better.. Wants refill Lunesta. Sleep pretty good with Lunesta.   8 hours sleep without SE  Mind won't turn off without some sleeper and feels bad next day. Asked questions about Quiviviq.  01/30/22 appt noted: Overall been alright with occ anger flares.  Wife's slowness gets on his nerves. No problems with irritability or anger in public. Always had anxiety since a kid but retired it's better. Not depressed. [place]  Likes to [employer]. Remains on fluoxetine 20 mg daily. Sleep good with Lunesta but without it has racing thoughts. No manic sx.   Past Psychiatric History: Dr. Jennelle Human from 2007-2017 Past Psychiatric Medication Trials: Zolpidem poor response, Ambien CR hangover Lunesta 2 effective Clonazepam 0.5 mg Paroxetine 10, citalopram 80, sertraline 100, Effexor 37.5,  Wellbutrin 150, Depakote fatigue Adderall, Ritalin Geodon panic, Seroquel hangover Lithium SE  Review of Systems:  Review of Systems  Genitourinary:        Sexual SE  Musculoskeletal:  Positive for arthralgias and back pain.  Neurological:  Negative for tremors.       Neuropathy  Psychiatric/Behavioral:  Negative for sleep disturbance.     Medications: I have reviewed the patient's current medications.  Current Outpatient Medications  Medication Sig Dispense Refill   amLODipine (NORVASC) 10 MG tablet Take 1 tablet (10 mg total) by mouth daily. 90 tablet 3   clopidogrel (PLAVIX) 75 MG tablet Take 1 tablet (75 mg total) by mouth daily. 90 tablet 3   EPINEPHrine 0.3 mg/0.3 mL IJ SOAJ injection Inject 0.3 mg into the muscle as needed for anaphylaxis. 2 each 1   eszopiclone (LUNESTA) 2 MG TABS tablet Take 1 tablet (2 mg total) by mouth at bedtime. 30 tablet 1   finasteride (PROSCAR) 5 MG tablet Take 5 mg by mouth daily.     FLUoxetine (PROZAC) 20 MG capsule Take 1 capsule (20 mg total) by mouth daily. 90 capsule 1   niacinamide 500 MG tablet Take 500 mg by mouth  daily in the afternoon.     nitroGLYCERIN (NITROSTAT) 0.4 MG SL tablet Place 1 tablet (0.4 mg total) under the tongue every 5 (five) minutes as needed for chest pain. 25 tablet 4   pantoprazole (PROTONIX) 40 MG tablet Take 40 mg by mouth daily.     rosuvastatin (CRESTOR) 20 MG tablet Take 20 mg by mouth daily.     RYALTRIS [id number] MCG/ACT SUSP Use two sprays in each nostril twice daily. 29 g 5   valsartan (DIOVAN) 160 MG tablet Take 1 tablet (160 mg total) by mouth daily. 90 tablet 3    No current facility-administered medications for this visit.    Medication Side Effects: GI and some sexual reduced interest  Allergies: No Known Allergies  Past Medical History:  Diagnosis Date   Anxiety    Arthritis    "mostly in my hands; sometimes other areas" (10/20/2013)   Bipolar disorder (HCC)    BPH (benign prostatic hyperplasia)    CAD S/P percutaneous coronary angioplasty 3/'13, 7/'15; 3/'21   (Therapeutic P2Y12 on Plavix); a) STEMI -m RCA DES PCI; 2015: Class III angina (abnl MYOVIEW w/ Ant-AntLat ischemia) -> m-d LAD 95-99% (PCI: Biotronik AG 2 DES 2.5 x 13, 2.75 mm); b. 3/'21 Cath/Staged PCI: LM nl, mLAD 60% (DFR .96), D1 65%  (DFR .95), mCx 60% (DFR .96); pRCA 60% - m-d 85&70% (CSI -> 3.5x38 Resolute DES p-m, 3.0x8 Resolute DES m-d connecting new & old  STENTS). EF 55-65%.   Depression    Dizziness of unknown cause January 2011   abn MRI, Neuro w/u Jan 2011   Dyslipidemia, goal LDL below 70    Essential hypertension    GERD (gastroesophageal reflux disease)    RA (rheumatoid arthritis) (HCC)    Rheumatic fever 1969   ST elevation myocardial infarction (STEMI) involving right coronary artery in recovery phase (HCC) 06/2011   100% occluded RCA; PCI - 3.0x24 Promus DES (3.5-3.6 mm) ; Mod w moderate LAD & Cx disease, EF 55-60% w/ basal inferoseptal HK confirmed by echo    Past Medical History, Surgical history, Social history, and Family history were reviewed and updated as appropriate.   Please see review of systems for further details on the patient's review from today.   Objective:   Physical Exam:  There were no vitals taken for this visit.  Physical Exam Constitutional:      General: He is not in acute distress. Musculoskeletal:        General: No deformity.  Neurological:     Mental Status: He is alert and oriented to person, place, and time.     Coordination: Coordination normal.  Psychiatric:        Attention and Perception: Attention and perception  normal. He does not perceive auditory or visual hallucinations.        Mood and Affect: Mood is anxious. Mood is not depressed. Affect is not labile, blunt, angry, tearful or inappropriate.        Speech: Speech normal.        Behavior: Behavior normal. Behavior is not slowed.        Thought Content: Thought content normal. Thought content is not paranoid or delusional. Thought content does not include homicidal or suicidal ideation. Thought content does not include suicidal plan.        Cognition and Memory: Cognition and memory normal.        Judgment: Judgment normal.     Comments: Insight intact irritability and anxiety improved and only  a problem at home.     Lab Review:     Component Value Date/Time   NA 141 10/16/2021 0825   K 4.9 10/16/2021 0825   CL 102 10/16/2021 0825   CO2 27 10/16/2021 0825   GLUCOSE 105 (H) 10/16/2021 0825   GLUCOSE 81 06/06/2019 0341   BUN 18 10/16/2021 0825   CREATININE 0.90 10/16/2021 0825   CREATININE 0.86 05/09/2016 1039   CALCIUM 9.9 10/16/2021 0825   PROT 7.2 10/16/2021 0825   ALBUMIN 5.1 (H) 10/16/2021 0825   AST 31 10/16/2021 0825   ALT 29 10/16/2021 0825   ALKPHOS 83 10/16/2021 0825   BILITOT 0.7 10/16/2021 0825   GFRNONAA >60 06/06/2019 0341   GFRNONAA >89 10/16/2013 1455   GFRAA >60 06/06/2019 0341   GFRAA >89 10/16/2013 1455       Component Value Date/Time   WBC 6.1 06/06/2019 0341   RBC 4.35 06/06/2019 0341   HGB 13.3 06/06/2019 0341   HGB 15.4 05/20/2019 1642   HCT 38.0 (L) 06/06/2019 0341   HCT 42.0 05/20/2019 1642   PLT 154 06/06/2019 0341   PLT 173 05/20/2019 1642   MCV 87.4 06/06/2019 0341   MCV 84 05/20/2019 1642   MCH 30.6 06/06/2019 0341   MCHC 35.0 06/06/2019 0341   RDW 12.6 06/06/2019 0341   RDW 13.1 05/20/2019 1642    No results found for: "POCLITH", "LITHIUM"   No results found for: "PHENYTOIN", "PHENOBARB", "VALPROATE", "CBMZ"   .res Assessment: Plan:    [name] was seen today for  follow-up.  Diagnoses and all orders for this visit:  Generalized anxiety disorder  Insomnia due to mental condition   Wife saw some degree of improvement with sertraline 50 mg in terms of his irritability.  He does not notice much change.  However he complains of side effects from the sertraline 50 mg with GI problems.  Sexual SE paroxetine Switch to fluoxetine 20 mg daily DT sexual SE paroxetine was successful.  Good sx control and SE minimal.  Ok Lunesta refill when needed.  We discussed the short-term risks associated with benzodiazepines including sedation and increased fall risk among others.  Discussed long-term side effect risk including dependence, potential withdrawal symptoms, and the potential eventual dose-related risk of dementia.  But recent studies from 2020 dispute this association between benzodiazepines and dementia risk. Newer studies in 2020 do not support an association with dementia.  Fu 6 mos  Lynder Parents, MD, DFAPA    Please see After Visit Summary for patient specific instructions.  Future Appointments  Date Time Provider [place]  02/05/2022  3:00 PM Kozlow, Donnamarie Poag, MD [facility] None  02/12/2022  1:30 PM [facility] None     No orders of the defined types were placed in this encounter.   -------------------------------

## 2022-01-31 ENCOUNTER — Ambulatory Visit: Payer: Medicare Other

## 2022-02-05 ENCOUNTER — Ambulatory Visit (INDEPENDENT_AMBULATORY_CARE_PROVIDER_SITE_OTHER): Payer: Medicare Other | Admitting: Allergy and Immunology

## 2022-02-05 ENCOUNTER — Encounter: Payer: Self-pay | Admitting: Allergy and Immunology

## 2022-02-05 VITALS — BP 126/84 | HR 88 | Resp 16

## 2022-02-05 DIAGNOSIS — Z9861 Coronary angioplasty status: Secondary | ICD-10-CM

## 2022-02-05 DIAGNOSIS — I2510 Atherosclerotic heart disease of native coronary artery without angina pectoris: Secondary | ICD-10-CM | POA: Diagnosis not present

## 2022-02-05 DIAGNOSIS — T63481D Toxic effect of venom of other arthropod, accidental (unintentional), subsequent encounter: Secondary | ICD-10-CM | POA: Diagnosis not present

## 2022-02-05 DIAGNOSIS — G4733 Obstructive sleep apnea (adult) (pediatric): Secondary | ICD-10-CM

## 2022-02-05 DIAGNOSIS — T63481A Toxic effect of venom of other arthropod, accidental (unintentional), initial encounter: Secondary | ICD-10-CM

## 2022-02-05 DIAGNOSIS — K219 Gastro-esophageal reflux disease without esophagitis: Secondary | ICD-10-CM | POA: Diagnosis not present

## 2022-02-05 DIAGNOSIS — J310 Chronic rhinitis: Secondary | ICD-10-CM

## 2022-02-05 MED ORDER — RABEPRAZOLE SODIUM 20 MG PO TBEC: DELAYED_RELEASE_TABLET | ORAL | 5 refills | Status: DC

## 2022-02-05 MED ORDER — FAMOTIDINE 40 MG PO TABS: ORAL_TABLET | ORAL | 5 refills | Status: DC

## 2022-02-05 NOTE — Progress Notes (Unsigned)
[place] - [place] - [place] - [place] - [place]   Follow-up Note  Referring Provider: No ref. provider found Primary Provider: No primary care provider on file. Date of Office Visit: 02/05/2022  Subjective:   [name] (DOB: 05-Aug-1955) is a 66 y.o. male who [facility] in re-evaluation of the following:  HPI: [name] returns to this clinic in evaluation of nonallergic rhinitis, hymenoptera venom hypersensitivity state, reflux.  He was last seen in this clinic for his initial evaluation with nurse practitioner Amada Jupiter on 27 December 2021.  He believes that his nose is doing pretty well at this point in time while using a combination nasal steroid and nasal antihistamine.  However, he still has a lot of postnasal drip in his throat especially in the morning.  He does have reflux that is intermittently active while intermittently using some omeprazole.  Occasionally needs to add in some Pepcid.  He has not retried his CPAP machine since he has been using his combination nasal steroid and nasal antihistamine.  In review of hymenoptera venom hypersensitivity he states that he is never really had a systemic reaction.  For the most part only gets his heart rate increased and he gets very anxious.  He has a small local reaction and never has any swelling reactions at any other part of his body.  Allergies as of 02/05/2022   No Known Allergies      Medication List    amLODipine 10 MG tablet Commonly known as: NORVASC Take 1 tablet (10 mg total) by mouth daily.   clopidogrel 75 MG tablet Commonly known as: PLAVIX Take 1 tablet (75 mg total) by mouth daily.   EPINEPHrine 0.3 mg/0.3 mL Soaj injection Commonly known as: EPI-PEN Inject 0.3 mg into the muscle as needed for anaphylaxis.   eszopiclone 2 MG Tabs tablet Commonly known as: LUNESTA Take 1 tablet (2 mg total) by mouth at bedtime.   finasteride 5 MG  tablet Commonly known as: PROSCAR Take 5 mg by mouth daily.   FLUoxetine 20 MG capsule Commonly known as: PROzac Take 1 capsule (20 mg total) by mouth daily.   niacinamide 500 MG tablet Take 500 mg by mouth daily in the afternoon.   nitroGLYCERIN 0.4 MG SL tablet Commonly known as: NITROSTAT Place 1 tablet (0.4 mg total) under the tongue every 5 (five) minutes as needed for chest pain.   rosuvastatin 20 MG tablet Commonly known as: CRESTOR Take 20 mg by mouth daily.   Ryaltris 270-35 MCG/ACT Susp Generic drug: Olopatadine-Mometasone Use two sprays in each nostril twice daily.   valsartan 160 MG tablet Commonly known as: DIOVAN Take 1 tablet (160 mg total) by mouth daily.    Past Medical History:  Diagnosis Date   Anxiety    Arthritis    "mostly in my hands; sometimes other areas" (10/20/2013)   Bipolar disorder (HCC)    BPH (benign prostatic hyperplasia)    CAD S/P percutaneous coronary angioplasty 3/'13, 7/'15; 3/'21   (Therapeutic P2Y12 on Plavix); a) STEMI -m RCA DES PCI; 2015: Class III angina (abnl MYOVIEW w/ Ant-AntLat ischemia) -> m-d LAD 95-99% (PCI: Biotronik AG 2 DES 2.5 x 13, 2.75 mm); b. 3/'21 Cath/Staged PCI: LM nl, mLAD 60% (DFR .96), D1 65%  (DFR .95), mCx 60% (DFR .96); pRCA 60% - m-d 85&70% (CSI -> 3.5x38 Resolute DES p-m, 3.0x8 Resolute DES m-d connecting new & old  STENTS). EF 55-65%.   Depression  Dizziness of unknown cause January 2011   abn MRI, Neuro w/u Jan 2011   Dyslipidemia, goal LDL below 70    Essential hypertension    GERD (gastroesophageal reflux disease)    RA (rheumatoid arthritis) (HCC)    Rheumatic fever 1969   ST elevation myocardial infarction (STEMI) involving right coronary artery in recovery phase (HCC) 06/2011   100% occluded RCA; PCI - 3.0x24 Promus DES (3.5-3.6 mm) ; Mod w moderate LAD & Cx disease, EF 55-60% w/ basal inferoseptal HK confirmed by echo    Past Surgical History:  Procedure Laterality Date   CORONARY  ATHERECTOMY N/A 06/05/2019   Procedure: CORONARY ATHERECTOMY;  Surgeon: Marykay Lex, MD;  Location: [facility] INVASIVE CV LAB;  Service: Cardiovascular; orbital atherectomy-mid RCA   CORONARY STENT INTERVENTION N/A 06/05/2019   Procedure: CORONARY STENT INTERVENTION;  Surgeon: Marykay Lex, MD;  Location: [facility] INVASIVE CV LAB;  Service: Cardiovascular;; pRCA 60% - m-d 85&70% (CSI -> 3.5x38 Resolute DES p-m, 3.0x8 Resolute DES m-d connecting new & old  STENTS).  Underestimated eccentric lesion in the bend making it difficult to connect the new stent to the prior stent (was thought to be stent - but was calcification   INTRAVASCULAR PRESSURE WIRE/FFR STUDY N/A 06/02/2019   Procedure: INTRAVASCULAR PRESSURE WIRE/FFR STUDY;  Surgeon: Marykay Lex, MD;  Location: [facility] INVASIVE CV LAB;  Service: Cardiovascular;  LCA: mLAD 60% (DFR .96), D1 65%  (DFR .95), mCx 60% (DFR .96)   LEFT HEART CATH AND CORONARY ANGIOGRAPHY N/A 06/02/2019   Procedure: LEFT HEART CATH AND CORONARY ANGIOGRAPHY;  Surgeon: Marykay Lex, MD;  Location: [facility] INVASIVE CV LAB;  Service: Cardiovascular;;; LM - nml; mLAD 60% (DFR .96), D1 65%  (DFR .95), mCx 60% (DFR .96); mRCA 60%, 85%&70% (staged CSI PCI)   LEFT HEART CATHETERIZATION WITH CORONARY ANGIOGRAM N/A 06/28/2011   Procedure: LEFT HEART CATHETERIZATION WITH CORONARY ANGIOGRAM;  Surgeon: Marykay Lex, MD;  Location: [facility] CATH LAB;  50% LAD after D1, D1 30%.  Small D2.  Large caliber LCx with (small OM1) major OM 2 and 3 followed by ABG circumflex with 40% focal lesion terminating with LPL 1.  100% mid RCA (DES PCI); beyond 100% RCA  - large PDA (double barrel) and small PAV-PL -EF 55 to 60% w/ midInf-LAf HK.   LEFT HEART CATHETERIZATION WITH CORONARY ANGIOGRAM N/A 10/20/2013   Procedure: LEFT HEART CATHETERIZATION WITH CORONARY ANGIOGRAM;  Surgeon: Marykay Lex, MD;  Location: [facility] CATH LAB;  Service: Cardiovascular;  Laterality: N/A;   NM MYOVIEW LTD N/A 10/19/2013   INTERMEDIATE RISK,  septal, anterior-anterolateral ischemia --> referred for cath the following day -- LAD LESION TREATED WITH DES PCI   NM MYOVIEW LTD  05/23/2018   EF estimated 45 to 50%.  Hypertensive response to exercise.  Medium sized moderate severity defect in the basal inferior mid inferolateral wall (read as intermediate risk) -> the defect actually appears to improve on stress imaging suggesting hibernating myocardium.   PERCUTANEOUS CORONARY STENT INTERVENTION (PCI-S) N/A 06/2011; 09/2013   a) 3/'13: 100%  RCA ->PCI 3.20x2 (3.6) Promus DES stent; 7/'15: mLAD PCI Biotronik AG DES 2.5 x13 2.75 mm), FFR D1 50-60% = 0.86 (small branch had ~70%), mCx ~50-60% FFR=1.0.   PLANTAR FASCIA SURGERY Left 2004   "& neuroma"   SHOULDER ARTHROSCOPY W/ ROTATOR CUFF REPAIR Left July 2011   TEMPORARY PACEMAKER N/A 06/05/2019   Procedure: TEMPORARY PACEMAKER;  Surgeon: Marykay Lex, MD;  Location: [facility] INVASIVE  CV LAB;  Service: Cardiovascular;  Laterality: N/A;   TRANSTHORACIC ECHOCARDIOGRAM N/A 05/2016   EF ~55%. Normal LV Size & function. NO RWMA (inferior HK no longer present). Mlid LVH - Gr 1 DD.. Mild LA dilation.     Review of systems negative except as noted in HPI / PMHx or noted below:  Review of Systems  Constitutional: Negative.   HENT: Negative.    Eyes: Negative.   Respiratory: Negative.    Cardiovascular: Negative.   Gastrointestinal: Negative.   Genitourinary: Negative.   Musculoskeletal: Negative.   Skin: Negative.   Neurological: Negative.   Endo/Heme/Allergies: Negative.   Psychiatric/Behavioral: Negative.       Objective:   Vitals:   02/05/22 1519  BP: 126/84  Pulse: 88  Resp: 16  SpO2: 95%          Physical Exam Constitutional:      Appearance: He is not diaphoretic.  HENT:     Head: Normocephalic.     Right Ear: Tympanic membrane, ear canal and external ear normal.     Left Ear: Tympanic membrane, ear canal and external ear normal.     Nose: Nose normal. No mucosal edema or  rhinorrhea.     Mouth/Throat:     Pharynx: Uvula midline. No oropharyngeal exudate.  Eyes:     Conjunctiva/sclera: Conjunctivae normal.  Neck:     Thyroid: No thyromegaly.     Trachea: Trachea normal. No tracheal tenderness or tracheal deviation.  Cardiovascular:     Rate and Rhythm: Normal rate and regular rhythm.     Heart sounds: Normal heart sounds, S1 normal and S2 normal. No murmur heard. Pulmonary:     Effort: No respiratory distress.     Breath sounds: Normal breath sounds. No stridor. No wheezing or rales.  Lymphadenopathy:     Head:     Right side of head: No tonsillar adenopathy.     Left side of head: No tonsillar adenopathy.     Cervical: No cervical adenopathy.  Skin:    Findings: No erythema or rash.     Nails: There is no clubbing.  Neurological:     Mental Status: He is alert.     Diagnostics: Results of blood tests obtained 29 December 2021 identified IgE antibodies directed against white faced hornet 0.16 KU/L, yellowjacket 0.39 KU/L, wasp 0.36 KU/L.  Assessment and Plan:   1. Nonallergic rhinitis   2. LPRD (laryngopharyngeal reflux disease)   3. 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  Allergen avoidance measures -hymenoptera venom  2.  Continue to treat inflammation with Ryaltris -2 sprays each nostril 1-2 times a day  3.  Treat reflux/LPR with the following:   A.  Omeprazole 40 mg - 1 tablet in AM  B.  Famotidine 40 mg - 1 tablet in p.m.  4.  If needed:   A.  Nasal saline  B.  EpiPen  5.  Retry CPAP  6.  Return to clinic late December 2023  Armarion will address the issue of LPR with the therapy noted above and will continue to use anti-inflammatory agents for his upper airway as noted above.  He does not appear to have a history consistent with significant hymenoptera venom hypersensitivity based on his history.  Even though he does have some IgE antibodies directed against hymenoptera venom I do not think he warrants any further  investigation for possible candidacy for immunotherapy directed against hymenoptera venom.  I will see him  back in this clinic in December 2023.  Laurette Schimke, MD Allergy / Immunology [facility]

## 2022-02-05 NOTE — Patient Instructions (Addendum)
  1.  Allergen avoidance measures -hymenoptera venom  2.  Continue to treat inflammation with Ryaltris -2 sprays each nostril 1-2 times a day  3.  Treat reflux/LPR with the following:   A.  Omeprazole 40 mg - 1 tablet in AM  B.  Famotidine 40 mg - 1 tablet in p.m.  4.  If needed:   A.  Nasal saline  B.  EpiPen  5.  Retry CPAP  6.  Return to clinic late December 2023

## 2022-02-06 ENCOUNTER — Encounter: Payer: Self-pay | Admitting: Allergy and Immunology

## 2022-02-10 LAB — COMPREHENSIVE METABOLIC PANEL
ALT: 29 IU/L (ref 0–44)
AST: 30 IU/L (ref 0–40)
Albumin/Globulin Ratio: 2.5 — ABNORMAL HIGH (ref 1.2–2.2)
Albumin: 5.2 g/dL — ABNORMAL HIGH (ref 3.9–4.9)
Alkaline Phosphatase: 87 IU/L (ref 44–121)
BUN/Creatinine Ratio: 19 (ref 10–24)
BUN: 16 mg/dL (ref 8–27)
Bilirubin Total: 0.8 mg/dL (ref 0.0–1.2)
CO2: 24 mmol/L (ref 20–29)
Calcium: 9.7 mg/dL (ref 8.6–10.2)
Chloride: 100 mmol/L (ref 96–106)
Creatinine, Ser: 0.86 mg/dL (ref 0.76–1.27)
Globulin, Total: 2.1 g/dL (ref 1.5–4.5)
Glucose: 102 mg/dL — ABNORMAL HIGH (ref 70–99)
Potassium: 4.2 mmol/L (ref 3.5–5.2)
Sodium: 142 mmol/L (ref 134–144)
Total Protein: 7.3 g/dL (ref 6.0–8.5)
eGFR: 95 mL/min/{1.73_m2} (ref >59)

## 2022-02-10 LAB — LIPID PANEL
Chol/HDL Ratio: 3.7 ratio (ref 0.0–5.0)
Cholesterol, Total: 142 mg/dL (ref 100–199)
HDL: 38 mg/dL — ABNORMAL LOW (ref >39)
LDL Chol Calc (NIH): 73 mg/dL (ref 0–99)
Triglycerides: 182 mg/dL — ABNORMAL HIGH (ref 0–149)
VLDL Cholesterol Cal: 31 mg/dL (ref 5–40)

## 2022-02-12 ENCOUNTER — Encounter: Payer: Self-pay | Admitting: Pharmacist Clinician (PhC)/ Clinical Pharmacy Specialist

## 2022-02-12 ENCOUNTER — Ambulatory Visit
Payer: Federal, State, Local not specified - PPO | Attending: Cardiology | Admitting: Pharmacist Clinician (PhC)/ Clinical Pharmacy Specialist

## 2022-02-12 VITALS — BP 134/89

## 2022-02-12 DIAGNOSIS — I10 Essential (primary) hypertension: Secondary | ICD-10-CM | POA: Diagnosis not present

## 2022-02-12 DIAGNOSIS — E785 Hyperlipidemia, unspecified: Secondary | ICD-10-CM

## 2022-02-12 NOTE — Progress Notes (Unsigned)
02/12/2022 [name] 1955/09/05 [id number]   HPI:  [name] is a 66 y.o. male patient of Dr Herbie Baltimore, who presents today for a lipid and hypertension clinic evaluation.  See pertinent past medical history below.  He is in the office today with his wife, and notes he has not checked his blood pressure much over the past month.  See below.  He had his first STEMI in 2013, then second in 2015.  His most recent heart cath, in 2021, which led to staged orbital atherectomy or the RCA.  He was restarted on rosuvastatin 20 mg at his last visit with Dr. Herbie Baltimore in August, and lipid labs, drawn last week, show LDL at 73.    Past Medical History: hypertension Lat visit 8/23 BP 150/88, restarted amlodipine 10 mg, also on valsartan 160 mg  hyperlipidemia 02/09/22 LDL 73 on rosuvastatin 20  CAD STEMI, Extensive PCI to RCA  DM2 5/23 A1c 5.9 was at 7 (2019), currently controlled with diet/lifestyle    Insurance Carrier:    [employer] FEP     Current Medications:    rosuvastati 20 mg qd Amlodipine 10 mg qd, valsartan 160 mg qd    Cholesterol Goals:  LDL < 55   Intolerant/previously tried:       nkda  Family history: father had CABG x 4 and valve in 35's, died at 33; mother had hypertension/hyperlipidemia, died at 64; 3 sisters w/o heart disease; 2 children healthy  Diet: mix of eating out and home; has eggs and bacon/sausage almost every day; likes liver pudding; some fried foods; trying to get more vegetables, but not good at it, mostly frozen; mix of proteins; snacks most days; does like snack foods  Exercise:  none; 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 90 tablet 3   clopidogrel (PLAVIX) 75 MG tablet Take 1 tablet (75 mg total) by mouth daily. 90 tablet 3   EPINEPHrine 0.3 mg/0.3 mL IJ SOAJ injection Inject 0.3 mg into the muscle as needed for anaphylaxis. 2 each 1   eszopiclone (LUNESTA) 2 MG TABS tablet Take 1 tablet (2 mg total) by mouth at bedtime. 90 tablet 1   famotidine (PEPCID) 40 MG tablet Take one tablet by mouth at bedtime. 30 tablet 5   finasteride (PROSCAR) 5 MG tablet Take 5 mg by mouth daily.     FLUoxetine (PROZAC) 20 MG capsule Take 1 capsule (20 mg total) by mouth daily. 90 capsule 1   niacinamide 500 MG tablet Take 500 mg by mouth daily in the afternoon.     nitroGLYCERIN (NITROSTAT) 0.4 MG SL tablet Place 1 tablet (0.4 mg total) under the tongue every 5 (five) minutes as needed for chest pain. 25 tablet 4   RABEprazole (ACIPHEX) 20 MG tablet Take one tablet by mouth every  morning. 30 tablet 5   rosuvastatin (CRESTOR) 20 MG tablet Take 20 mg by mouth daily.     RYALTRIS [id number] MCG/ACT SUSP Use two sprays in each nostril twice daily. 29 g 5   valsartan (DIOVAN) 160 MG tablet Take 1 tablet (160 mg total) by mouth daily. 90 tablet 3   No current facility-administered medications for this visit.    No Known Allergies  Past Medical History:  Diagnosis Date   Anxiety    Arthritis    "mostly in my hands; sometimes other areas" (10/20/2013)   Bipolar disorder (HCC)    BPH (benign prostatic hyperplasia)    CAD S/P percutaneous coronary angioplasty 3/'13, 7/'15; 3/'21   (Therapeutic P2Y12 on Plavix); a) STEMI -m RCA DES PCI; 2015: Class III angina (abnl MYOVIEW w/ Ant-AntLat ischemia) -> m-d LAD 95-99% (PCI: Biotronik AG 2 DES 2.5 x 13, 2.75 mm); b. 3/'21 Cath/Staged PCI: LM nl, mLAD 60%  (DFR .96), D1 65%  (DFR .95), mCx 60% (DFR .96); pRCA 60% - m-d 85&70% (CSI -> 3.5x38 Resolute DES p-m, 3.0x8 Resolute DES m-d connecting new & old  STENTS). EF 55-65%.   Depression    Dizziness of unknown cause January 2011   abn MRI, Neuro w/u Jan 2011   Dyslipidemia, goal LDL below 70    Essential hypertension    GERD (gastroesophageal reflux disease)    RA (rheumatoid arthritis) (HCC)    Rheumatic fever 1969   ST elevation myocardial infarction (STEMI) involving right coronary artery in recovery phase (HCC) 06/2011   100% occluded RCA; PCI - 3.0x24 Promus DES (3.5-3.6 mm) ; Mod w moderate LAD & Cx disease, EF 55-60% w/ basal inferoseptal HK confirmed by echo    Blood pressure 134/89.   Essential hypertension Patient with diastolic elevation in BP in the office today.  Stated home readings mostly 70's and into low 80's.  Will have patient check BP at home 3-4 times weekly and record information in given log.  Will reach out in a month to see if home readings doing better than in office.  Continue valsartan and amlodipine for now.   Dyslipidemia, goal LDL below 70 Patient with ASCVD, multiple stents and LDL not to goal of > 55 on rosuvastatin 20 mg daily.  Reviewed options for lowering LDL cholesterol, including ezetimibe, PCSK-9 inhibitors, bempedoic acid and inclisiran.  Discussed mechanisms of action, dosing, side effects and potential decreases in LDL cholesterol.  Also reviewed cost information and potential options for patient assistance.  Answered all patient questions.  Based on this information, patient would prefer to start PCSK9 inhibitor.   Will submit PA to his insurance and once approved will do Repatha 140 mg q14 days.  He will need to repeat lipid labs after 3 months.    He was also encouraged to work on dietary/lifestyle changes including regular walking and avoiding bacon/sausage with eggs except on occasion.     Phillips Hay PharmD CPP [facility] [address] [place], [place] [address] [phone]

## 2022-02-12 NOTE — Assessment & Plan Note (Signed)
Patient with diastolic elevation in BP in the office today.  Stated home readings mostly 70's and into low 80's.  Will have patient check BP at home 3-4 times weekly and record information in given log.  Will reach out in a month to see if home readings doing better than in office.  Continue valsartan and amlodipine for now.

## 2022-02-12 NOTE — Patient Instructions (Signed)
Your Results:             Your most recent labs Goal  Total Cholesterol 142 < 200  Triglycerides 182 < 150  HDL (happy/good cholesterol) 38 > 40  LDL (lousy/bad cholesterol 73 < 55   Medication changes:  We will start the process to get Repatha (or Praluent) covered by your insurance.  Once the prior authorization is complete, [facility] will call you to let you know and confirm pharmacy information.   You will take one injection every 14 days.   Continue with your valsartan and amlodipine for now.  Check your BP at home 3-4 times each week and write the numbers down.  After about a month send me a My Chart message to let me know how your pressure is doing.    Lab orders:  We want to repeat labs after 2-3 months.  We will send you a lab order to remind you once we get closer to that time.    Patient Assistance:  The [employer] foundation offers assistance to help pay for medication copays.  They will cover copays for all cholesterol lowering meds, including statins, fibrates, omega-3 oils, ezetimibe, Repatha, Praluent, Nexletol, Nexlizet.  The cards are usually good for $2,500 or 12 months, whichever comes first. Go to healthwellfoundation.org Click on "Apply Now" Answer questions as to whom is applying (patient or representative) Your disease fund will be "hypercholesterolemia - Medicare access" They will ask questions about finances and which medications you are taking for cholesterol When you submit, the approval is usually within minutes.  You will need to print the card information from the site You will need to show this information to your pharmacy, they will bill your Medicare Part D plan first -then bill [employer] --for the copay.   You can also call them at [phone], although the hold times can be quite long.     HOW TO TAKE YOUR BLOOD PRESSURE: Rest 5 minutes before taking your blood pressure.  Don't smoke or drink caffeinated beverages for at least 30 minutes  before. Take your blood pressure before (not after) you eat. Sit comfortably with your back supported and both feet on the floor (don't cross your legs). Elevate your arm to heart level on a table or a desk. Use the proper sized cuff. It should fit smoothly and snugly around your bare upper arm. There should be enough room to slip a fingertip under the cuff. The bottom edge of the cuff should be 1 inch above the crease of the elbow. Ideally, take 3 measurements at one sitting and record the average.   55

## 2022-02-12 NOTE — Assessment & Plan Note (Addendum)
Patient with ASCVD, multiple stents and LDL not to goal of > 55 on rosuvastatin 20 mg daily.  Reviewed options for lowering LDL cholesterol, including ezetimibe, PCSK-9 inhibitors, bempedoic acid and inclisiran.  Discussed mechanisms of action, dosing, side effects and potential decreases in LDL cholesterol.  Also reviewed cost information and potential options for patient assistance.  Answered all patient questions.  Based on this information, patient would prefer to start PCSK9 inhibitor.   Will submit PA to his insurance and once approved will do Repatha 140 mg q14 days.  He will need to repeat lipid labs after 3 months.    He was also encouraged to work on dietary/lifestyle changes including regular walking and avoiding bacon/sausage with eggs except on occasion.

## 2022-02-16 ENCOUNTER — Telehealth: Payer: Self-pay | Admitting: Pharmacist Clinician (PhC)/ Clinical Pharmacy Specialist

## 2022-02-18 MED ORDER — REPATHA SURECLICK 140 MG/ML ~~LOC~~ SOAJ: 140.0000 mg | SUBCUTANEOUS | 12 refills | Status: DC

## 2022-02-18 NOTE — Telephone Encounter (Signed)
PA fro Repatha sent to [facility] and approved to 02/15/2023  Key: [id number]

## 2022-03-29 ENCOUNTER — Encounter: Payer: Self-pay | Admitting: Allergy and Immunology

## 2022-03-29 ENCOUNTER — Ambulatory Visit (INDEPENDENT_AMBULATORY_CARE_PROVIDER_SITE_OTHER): Payer: Medicare Other | Admitting: Allergy and Immunology

## 2022-03-29 VITALS — BP 142/86 | HR 77 | Temp 98.1°F

## 2022-03-29 DIAGNOSIS — T63481D Toxic effect of venom of other arthropod, accidental (unintentional), subsequent encounter: Secondary | ICD-10-CM

## 2022-03-29 DIAGNOSIS — J310 Chronic rhinitis: Secondary | ICD-10-CM

## 2022-03-29 DIAGNOSIS — I2510 Atherosclerotic heart disease of native coronary artery without angina pectoris: Secondary | ICD-10-CM

## 2022-03-29 DIAGNOSIS — G4733 Obstructive sleep apnea (adult) (pediatric): Secondary | ICD-10-CM

## 2022-03-29 DIAGNOSIS — Z9861 Coronary angioplasty status: Secondary | ICD-10-CM

## 2022-03-29 DIAGNOSIS — T63481A Toxic effect of venom of other arthropod, accidental (unintentional), initial encounter: Secondary | ICD-10-CM

## 2022-03-29 DIAGNOSIS — K219 Gastro-esophageal reflux disease without esophagitis: Secondary | ICD-10-CM | POA: Diagnosis not present

## 2022-03-29 NOTE — Progress Notes (Signed)
[place] - [place] - [place] - [place] - [place]   Follow-up Note  Referring Provider: No ref. provider found Primary Provider: No primary care provider on file. Date of Office Visit: 03/29/2022  Subjective:   [name] (DOB: Jul 09, 1955) is a 66 y.o. male who [facility] in re-evaluation of the following:  HPI: [name] returns to this clinic in evaluation of nonallergic rhinitis, reflux with LPR, hymenoptera venom hypersensitivity state.  He was last seen in this clinic 05 February 2022.  The big issue for [name] at this point in time is that he still has not restarted his CPAP machine.  He states that he gets stuffiness at nighttime and "suffocates" when he uses CPAP.  He has been using his Ryaltris on a consistent basis and thinks that during the daytime his nose is doing well.  He has been aggressively treating his reflux and he has not had any classic reflux symptoms.  He still has some postnasal drip especially in the morning.  He has obtained a flu vaccine.  Allergies as of 03/29/2022   No Known Allergies      Medication List    amLODipine 10 MG tablet Commonly known as: NORVASC Take 1 tablet (10 mg total) by mouth daily.   clopidogrel 75 MG tablet Commonly known as: PLAVIX Take 1 tablet (75 mg total) by mouth daily.   EPINEPHrine 0.3 mg/0.3 mL Soaj injection Commonly known as: EPI-PEN Inject 0.3 mg into the muscle as needed for anaphylaxis.   famotidine 40 MG tablet Commonly known as: PEPCID Take one tablet by mouth at bedtime.   finasteride 5 MG tablet Commonly known as: PROSCAR Take 5 mg by mouth daily.   FLUoxetine 20 MG capsule Commonly known as: PROzac Take 1 capsule (20 mg total) by mouth daily.   niacinamide 500 MG tablet Take 500 mg by mouth daily in the afternoon.   nitroGLYCERIN 0.4 MG SL tablet Commonly known as: NITROSTAT Place 1 tablet (0.4 mg total) under the tongue every 5 (five)  minutes as needed for chest pain.   RABEprazole 20 MG tablet Commonly known as: ACIPHEX Take one tablet by mouth every morning.   Repatha SureClick 140 MG/ML Soaj Generic drug: Evolocumab Inject 140 mg into the skin every 14 (fourteen) days.   rosuvastatin 20 MG tablet Commonly known as: CRESTOR Take 20 mg by mouth daily.   Ryaltris 967-59 MCG/ACT Susp Generic drug: Olopatadine-Mometasone Use two sprays in each nostril twice daily.   valsartan 160 MG tablet Commonly known as: DIOVAN Take 1 tablet (160 mg total) by mouth daily.    Past Medical History:  Diagnosis Date   Anxiety    Arthritis    "mostly in my hands; sometimes other areas" (10/20/2013)   Bipolar disorder (HCC)    BPH (benign prostatic hyperplasia)    CAD S/P percutaneous coronary angioplasty 3/'13, 7/'15; 3/'21   (Therapeutic P2Y12 on Plavix); a) STEMI -m RCA DES PCI; 2015: Class III angina (abnl MYOVIEW w/ Ant-AntLat ischemia) -> m-d LAD 95-99% (PCI: Biotronik AG 2 DES 2.5 x 13, 2.75 mm); b. 3/'21 Cath/Staged PCI: LM nl, mLAD 60% (DFR .96), D1 65%  (DFR .95), mCx 60% (DFR .96); pRCA 60% - m-d 85&70% (CSI -> 3.5x38 Resolute DES p-m, 3.0x8 Resolute DES m-d connecting new & old  STENTS). EF 55-65%.   Depression    Dizziness of unknown cause January 2011   abn MRI, Neuro w/u Jan 2011  Dyslipidemia, goal LDL below 70    Essential hypertension    GERD (gastroesophageal reflux disease)    RA (rheumatoid arthritis) (HCC)    Rheumatic fever 1969   ST elevation myocardial infarction (STEMI) involving right coronary artery in recovery phase (HCC) 06/2011   100% occluded RCA; PCI - 3.0x24 Promus DES (3.5-3.6 mm) ; Mod w moderate LAD & Cx disease, EF 55-60% w/ basal inferoseptal HK confirmed by echo    Past Surgical History:  Procedure Laterality Date   CORONARY ATHERECTOMY N/A 06/05/2019   Procedure: CORONARY ATHERECTOMY;  Surgeon: Marykay Lex, MD;  Location: [facility] INVASIVE CV LAB;  Service: Cardiovascular; orbital  atherectomy-mid RCA   CORONARY STENT INTERVENTION N/A 06/05/2019   Procedure: CORONARY STENT INTERVENTION;  Surgeon: Marykay Lex, MD;  Location: [facility] INVASIVE CV LAB;  Service: Cardiovascular;; pRCA 60% - m-d 85&70% (CSI -> 3.5x38 Resolute DES p-m, 3.0x8 Resolute DES m-d connecting new & old  STENTS).  Underestimated eccentric lesion in the bend making it difficult to connect the new stent to the prior stent (was thought to be stent - but was calcification   INTRAVASCULAR PRESSURE WIRE/FFR STUDY N/A 06/02/2019   Procedure: INTRAVASCULAR PRESSURE WIRE/FFR STUDY;  Surgeon: Marykay Lex, MD;  Location: [facility] INVASIVE CV LAB;  Service: Cardiovascular;  LCA: mLAD 60% (DFR .96), D1 65%  (DFR .95), mCx 60% (DFR .96)   LEFT HEART CATH AND CORONARY ANGIOGRAPHY N/A 06/02/2019   Procedure: LEFT HEART CATH AND CORONARY ANGIOGRAPHY;  Surgeon: Marykay Lex, MD;  Location: [facility] INVASIVE CV LAB;  Service: Cardiovascular;;; LM - nml; mLAD 60% (DFR .96), D1 65%  (DFR .95), mCx 60% (DFR .96); mRCA 60%, 85%&70% (staged CSI PCI)   LEFT HEART CATHETERIZATION WITH CORONARY ANGIOGRAM N/A 06/28/2011   Procedure: LEFT HEART CATHETERIZATION WITH CORONARY ANGIOGRAM;  Surgeon: Marykay Lex, MD;  Location: [facility] CATH LAB;  50% LAD after D1, D1 30%.  Small D2.  Large caliber LCx with (small OM1) major OM 2 and 3 followed by ABG circumflex with 40% focal lesion terminating with LPL 1.  100% mid RCA (DES PCI); beyond 100% RCA  - large PDA (double barrel) and small PAV-PL -EF 55 to 60% w/ midInf-LAf HK.   LEFT HEART CATHETERIZATION WITH CORONARY ANGIOGRAM N/A 10/20/2013   Procedure: LEFT HEART CATHETERIZATION WITH CORONARY ANGIOGRAM;  Surgeon: Marykay Lex, MD;  Location: [facility] CATH LAB;  Service: Cardiovascular;  Laterality: N/A;   NM MYOVIEW LTD N/A 10/19/2013   INTERMEDIATE RISK, septal, anterior-anterolateral ischemia --> referred for cath the following day -- LAD LESION TREATED WITH DES PCI   NM MYOVIEW LTD  05/23/2018   EF  estimated 45 to 50%.  Hypertensive response to exercise.  Medium sized moderate severity defect in the basal inferior mid inferolateral wall (read as intermediate risk) -> the defect actually appears to improve on stress imaging suggesting hibernating myocardium.   PERCUTANEOUS CORONARY STENT INTERVENTION (PCI-S) N/A 06/2011; 09/2013   a) 3/'13: 100%  RCA ->PCI 3.20x2 (3.6) Promus DES stent; 7/'15: mLAD PCI Biotronik AG DES 2.5 x13 2.75 mm), FFR D1 50-60% = 0.86 (small branch had ~70%), mCx ~50-60% FFR=1.0.   PLANTAR FASCIA SURGERY Left 2004   "& neuroma"   SHOULDER ARTHROSCOPY W/ ROTATOR CUFF REPAIR Left July 2011   TEMPORARY PACEMAKER N/A 06/05/2019   Procedure: TEMPORARY PACEMAKER;  Surgeon: Marykay Lex, MD;  Location: [facility] INVASIVE CV LAB;  Service: Cardiovascular;  Laterality: N/A;   TRANSTHORACIC ECHOCARDIOGRAM N/A 05/2016  EF ~55%. Normal LV Size & function. NO RWMA (inferior HK no longer present). Mlid LVH - Gr 1 DD.. Mild LA dilation.     Review of systems negative except as noted in HPI / PMHx or noted below:  Review of Systems  Constitutional: Negative.   HENT: Negative.    Eyes: Negative.   Respiratory: Negative.    Cardiovascular: Negative.   Gastrointestinal: Negative.   Genitourinary: Negative.   Musculoskeletal: Negative.   Skin: Negative.   Neurological: Negative.   Endo/Heme/Allergies: Negative.   Psychiatric/Behavioral: Negative.       Objective:   Vitals:   03/29/22 1459  BP: (!) 142/86  Pulse: 77  Temp: 98.1 F (36.7 C)  SpO2: 96%          Physical Exam Constitutional:      Appearance: He is not diaphoretic.  HENT:     Head: Normocephalic.     Right Ear: Tympanic membrane, ear canal and external ear normal.     Left Ear: Tympanic membrane, ear canal and external ear normal.     Nose: Nose normal. No mucosal edema or rhinorrhea.     Mouth/Throat:     Pharynx: Uvula midline. No oropharyngeal exudate.  Eyes:     Conjunctiva/sclera: Conjunctivae  normal.  Neck:     Thyroid: No thyromegaly.     Trachea: Trachea normal. No tracheal tenderness or tracheal deviation.  Cardiovascular:     Rate and Rhythm: Normal rate and regular rhythm.     Heart sounds: Normal heart sounds, S1 normal and S2 normal. No murmur heard. Pulmonary:     Effort: No respiratory distress.     Breath sounds: Normal breath sounds. No stridor. No wheezing or rales.  Lymphadenopathy:     Head:     Right side of head: No tonsillar adenopathy.     Left side of head: No tonsillar adenopathy.     Cervical: No cervical adenopathy.  Skin:    Findings: No erythema or rash.     Nails: There is no clubbing.  Neurological:     Mental Status: He is alert.     Diagnostics: none  Assessment and Plan:   1. Nonallergic rhinitis   2. LPRD (laryngopharyngeal reflux disease)   3. 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 Obstructive sleep apnea     1.  Allergen avoidance measures -hymenoptera venom  2.  Use the following in the morning:   A. Ryaltris - 2 sprays each nostril  3. Use the following before CPAP mask use:   A. Afrin - 1 spray in single nostril. Alternate nostril every night  B. Ryaltris - 1 spray in single nostril. Alternate nostril every night  4.  Treat reflux/LPR with the following:   A.  Omeprazole 40 mg - 1 tablet in AM  B.  Famotidine 40 mg - 1 tablet in p.m.  5.  If needed:   A.  Nasal saline  B.  EpiPen  6.  Return to clinic 12 weeks or earlier if problem  7. Obtain RSV vaccine  We need to get [name] to use a CPAP machine and I think the best way to do this is to use the therapy noted above which does use a very low-dose of a nasal decongestant spray in just 1 nostril per night.  He will also continue to treat his LPR with the therapy noted above.  I will see him back in this clinic in 12 weeks or earlier if  there is a problem.  Allena Katz, MD Allergy / Immunology [place]

## 2022-03-29 NOTE — Patient Instructions (Addendum)
  1.  Allergen avoidance measures -hymenoptera venom  2.  Use the following in the morning:   A. Ryaltris - 2 sprays each nostril  3. Use the following before CPAP mask use:   A. Afrin - 1 spray in single nostril. Alternate nostril every night  B. Ryaltris - 1 spray in single nostril. Alternate nostril every night  4.  Treat reflux/LPR with the following:   A.  Omeprazole 40 mg - 1 tablet in AM  B.  Famotidine 40 mg - 1 tablet in p.m.  5.  If needed:   A.  Nasal saline  B.  EpiPen  6.  Return to clinic 12 weeks or earlier if problem  7. Obtain RSV vaccine

## 2022-04-03 ENCOUNTER — Encounter: Payer: Self-pay | Admitting: Allergy and Immunology

## 2022-06-08 ENCOUNTER — Telehealth: Payer: Self-pay | Admitting: Cardiology

## 2022-06-08 ENCOUNTER — Other Ambulatory Visit: Payer: Self-pay

## 2022-06-08 MED ORDER — ROSUVASTATIN CALCIUM 20 MG PO TABS: 40.0000 mg | ORAL_TABLET | Freq: Every day | ORAL | 1 refills | Status: DC

## 2022-06-08 MED ORDER — ROSUVASTATIN CALCIUM 20 MG PO TABS: 40.0000 mg | ORAL_TABLET | Freq: Every day | ORAL | 0 refills | Status: DC

## 2022-06-08 NOTE — Telephone Encounter (Signed)
Refills has been sent to the pharmacy. 

## 2022-06-08 NOTE — Telephone Encounter (Signed)
*  STAT* If patient is at the pharmacy, call can be transferred to refill team.   1. Which medications need to be refilled? (please list name of each medication and dose if known) rosuvastatin (CRESTOR) 20 MG tablet  Take 20 mg by mouth daily.   2. Which pharmacy/location (including street and city if local pharmacy) is medication to be sent to?[facility] - [place], [place]  3. Do they need a 30 day or 90 day supply? 90 Day Supply    Pt stated they are currently out of this medication

## 2022-06-21 ENCOUNTER — Ambulatory Visit (INDEPENDENT_AMBULATORY_CARE_PROVIDER_SITE_OTHER): Payer: Medicare Other | Admitting: Allergy and Immunology

## 2022-06-21 VITALS — BP 126/74 | HR 74 | Temp 98.6°F | Resp 16

## 2022-06-21 DIAGNOSIS — K219 Gastro-esophageal reflux disease without esophagitis: Secondary | ICD-10-CM

## 2022-06-21 DIAGNOSIS — T63481A Toxic effect of venom of other arthropod, accidental (unintentional), initial encounter: Secondary | ICD-10-CM

## 2022-06-21 DIAGNOSIS — G4733 Obstructive sleep apnea (adult) (pediatric): Secondary | ICD-10-CM | POA: Diagnosis not present

## 2022-06-21 DIAGNOSIS — J310 Chronic rhinitis: Secondary | ICD-10-CM | POA: Diagnosis not present

## 2022-06-21 MED ORDER — FAMOTIDINE 40 MG PO TABS: ORAL_TABLET | ORAL | 5 refills | Status: DC

## 2022-06-21 MED ORDER — EPINEPHRINE 0.3 MG/0.3ML IJ SOAJ: 0.3000 mg | INTRAMUSCULAR | 1 refills | Status: DC | PRN

## 2022-06-21 NOTE — Patient Instructions (Addendum)
  1.  Allergen avoidance measures -hymenoptera venom  2.  Use the following in the morning:   A. Flonase / Fluticasone - 1 spray each nostril  3. Use the following before CPAP mask use:   A. Afrin - 1 spray in single nostril. Alternate nostril every night  B. Flonase / Fluticasone - 1 spray each nostril  4.  Treat reflux/LPR with the following:   A.  Omeprazole 40 mg - 1 tablet in AM  B.  Famotidine 40 mg - 1 tablet in p.m.  5.  If needed:   A.  Nasal saline  B.  EpiPen  6.  Return to clinic summer 2024 or earlier if problem

## 2022-06-21 NOTE — Progress Notes (Signed)
[place] [facility] [place] [facility] [place]   Follow-up Note  Referring Provider: No ref. provider found Primary Provider: Patient, No Pcp Per Date of Office Visit: 06/21/2022  Subjective:   [name] (DOB: 02-27-1956) is a 67 y.o. male who returns to the [facility] [place] on 06/21/2022 in re-evaluation of the following:  HPI: Happy returns to this clinic in evaluation of nonallergic rhinitis, LPR, sleep apnea, hymenoptera venom hypersensitivity state.  I last saw him in this clinic 29 March 2022.  He has run out of his combination nasal spray and is now using Flonase.  He has been a little bit sloppy about following his rigid routine of using Afrin 1 spray  alternate nostril and Flonase utilized together.  Now he will use Flonase every day then he will occasionally use Afrin at night.  He is now able to use his CPAP machine.  His reflux is under excellent control on his current plan of omeprazole and famotidine.  He has not been stung by hymenoptera.  Allergies as of 06/21/2022   No Known Allergies      Medication List    amLODipine 10 MG tablet Commonly known as: NORVASC Take 1 tablet (10 mg total) by mouth daily.   clopidogrel 75 MG tablet Commonly known as: PLAVIX Take 1 tablet (75 mg total) by mouth daily.   EPINEPHrine 0.3 mg/0.3 mL Soaj injection Commonly known as: EPI-PEN Inject 0.3 mg into the muscle as needed for anaphylaxis.   famotidine 40 MG tablet Commonly known as: PEPCID Take one tablet by mouth at bedtime.   finasteride 5 MG tablet Commonly known as: PROSCAR Take 5 mg by mouth daily.   FLUoxetine 20 MG capsule Commonly known as: PROzac Take 1 capsule (20 mg total) by mouth daily.   fluticasone 50 MCG/ACT nasal spray Commonly known as: FLONASE Place 2 sprays into both nostrils daily.   niacinamide 500 MG tablet Take 500 mg by mouth daily in the afternoon.   nitroGLYCERIN 0.4 MG SL tablet Commonly known  as: NITROSTAT Place 1 tablet (0.4 mg total) under the tongue every 5 (five) minutes as needed for chest pain.   Repatha SureClick [id number] MG/ML Soaj Generic drug: Evolocumab Inject 140 mg into the skin every 14 (fourteen) days.   rosuvastatin 20 MG tablet Commonly known as: CRESTOR Take 2 tablets (40 mg total) by mouth daily.   Ryaltris [id number] MCG/ACT Susp Generic drug: Olopatadine-Mometasone Use two sprays in each nostril twice daily.   valsartan 160 MG tablet Commonly known as: DIOVAN Take 1 tablet (160 mg total) by mouth daily.    Past Medical History:  Diagnosis Date   Anxiety    Arthritis    "mostly in my hands; sometimes other areas" (10/20/2013)   Bipolar disorder (HCC)    BPH (benign prostatic hyperplasia)    CAD S/P percutaneous coronary angioplasty 3/'13, 7/'15; 3/'21   (Therapeutic P2Y12 on Plavix); a) STEMI -m RCA DES PCI; 2015: Class III angina (abnl MYOVIEW w/ Ant-AntLat ischemia) -> m-d LAD 95-99% (PCI: Biotronik AG 2 DES 2.5 x 13, 2.75 mm); b. 3/'21 Cath/Staged PCI: LM nl, mLAD 60% (DFR .96), D1 65%  (DFR .95), mCx 60% (DFR .96); pRCA 60% - m-d 85&70% (CSI -> 3.5x38 Resolute DES p-m, 3.0x8 Resolute DES m-d connecting new & old  STENTS). EF 55-65%.   Depression    Dizziness of unknown cause January 2011   abn MRI, Neuro w/u Jan 2011   Dyslipidemia, goal  LDL below 70    Essential hypertension    GERD (gastroesophageal reflux disease)    RA (rheumatoid arthritis) (HCC)    Rheumatic fever 1969   ST elevation myocardial infarction (STEMI) involving right coronary artery in recovery phase ([place]) 06/2011   100% occluded RCA; PCI - 3.0x24 Promus DES (3.5-3.6 mm) ; Mod w moderate LAD & Cx disease, EF 55-60% w/ basal inferoseptal HK confirmed by echo    Past Surgical History:  Procedure Laterality Date   CORONARY ATHERECTOMY N/A 06/05/2019   Procedure: CORONARY ATHERECTOMY;  Surgeon: Leonie Man, MD;  Location: [facility];  Service: Cardiovascular; orbital  atherectomy-mid RCA   CORONARY STENT INTERVENTION N/A 06/05/2019   Procedure: CORONARY STENT INTERVENTION;  Surgeon: Leonie Man, MD;  Location: [facility];  Service: Cardiovascular;; pRCA 60% - m-d 85&70% (CSI -> 3.5x38 Resolute DES p-m, 3.0x8 Resolute DES m-d connecting new & old  STENTS).  Underestimated eccentric lesion in the bend making it difficult to connect the new stent to the prior stent (was thought to be stent - but was calcification   INTRAVASCULAR PRESSURE WIRE/FFR STUDY N/A 06/02/2019   Procedure: INTRAVASCULAR PRESSURE WIRE/FFR STUDY;  Surgeon: Leonie Man, MD;  Location: [facility];  Service: Cardiovascular;  LCA: mLAD 60% (DFR .96), D1 65%  (DFR .95), mCx 60% (DFR .96)   LEFT HEART CATH AND CORONARY ANGIOGRAPHY N/A 06/02/2019   Procedure: LEFT HEART CATH AND CORONARY ANGIOGRAPHY;  Surgeon: Leonie Man, MD;  Location: [facility];  Service: Cardiovascular;;; LM - nml; mLAD 60% (DFR .96), D1 65%  (DFR .95), mCx 60% (DFR .96); mRCA 60%, 85%&70% (staged CSI PCI)   LEFT HEART CATHETERIZATION WITH CORONARY ANGIOGRAM N/A 06/28/2011   Procedure: LEFT HEART CATHETERIZATION WITH CORONARY ANGIOGRAM;  Surgeon: Leonie Man, MD;  Location: [facility] CATH LAB;  50% LAD after D1, D1 30%.  Small D2.  Large caliber LCx with (small OM1) major OM 2 and 3 followed by ABG circumflex with 40% focal lesion terminating with LPL 1.  100% mid RCA (DES PCI); beyond 100% RCA  - large PDA (double barrel) and small PAV-PL -EF 55 to 60% w/ midInf-LAf HK.   LEFT HEART CATHETERIZATION WITH CORONARY ANGIOGRAM N/A 10/20/2013   Procedure: LEFT HEART CATHETERIZATION WITH CORONARY ANGIOGRAM;  Surgeon: Leonie Man, MD;  Location: [facility] C[facility] LAB;  Service: Cardiovascular;  Laterality: N/A;   NM MYOVIEW LTD N/A 10/19/2013   INTERMEDIATE RISK, septal, anterior-anterolateral ischemia --> referred for cath the following day -- LAD LESION TREATED WITH DES PCI   NM MYOVIEW LTD  05/23/2018   EF  estimated 45 to 50%.  Hypertensive response to exercise.  Medium sized moderate severity defect in the basal inferior mid inferolateral wall (read as intermediate risk) -> the defect actually appears to improve on stress imaging suggesting hibernating myocardium.   PERCUTANEOUS CORONARY STENT INTERVENTION (PCI-S) N/A 06/2011; 09/2013   a) 3/'13: 100%  RCA ->PCI 3.20x2 (3.6) Promus DES stent; 7/'15: mLAD PCI Biotronik AG DES 2.5 x13 2.75 mm), FFR D1 50-60% = 0.86 (small branch had ~70%), mCx ~50-60% FFR=1.0.   PLANTAR FASCIA SURGERY Left 2004   "& neuroma"   SHOULDER ARTHROSCOPY W/ ROTATOR CUFF REPAIR Left July 2011   TEMPORARY PACEMAKER N/A 06/05/2019   Procedure: TEMPORARY PACEMAKER;  Surgeon: Leonie Man, MD;  Location: [facility];  Service: Cardiovascular;  Laterality: N/A;   TRANSTHORACIC ECHOCARDIOGRAM N/A 05/2016   EF ~55%.  Normal LV Size & function. NO RWMA (inferior HK no longer present). Mlid LVH - Gr 1 DD.. Mild LA dilation.     Review of systems negative except as noted in HPI / PMHx or noted below:  Review of Systems  Constitutional: Negative.   HENT: Negative.    Eyes: Negative.   Respiratory: Negative.    Cardiovascular: Negative.   Gastrointestinal: Negative.   Genitourinary: Negative.   Musculoskeletal: Negative.   Skin: Negative.   Neurological: Negative.   Endo/Heme/Allergies: Negative.   Psychiatric/Behavioral: Negative.       Objective:   Vitals:   06/21/22 1505  BP: 126/74  Pulse: 74  Resp: 16  Temp: 98.6 F (37 C)  SpO2: 96%          Physical Exam Constitutional:      Appearance: He is not diaphoretic.  HENT:     Head: Normocephalic.     Right Ear: Tympanic membrane, ear canal and external ear normal.     Left Ear: Tympanic membrane, ear canal and external ear normal.     Nose: Nose normal. No mucosal edema or rhinorrhea.     Mouth/Throat:     Pharynx: Uvula midline. No oropharyngeal exudate.  Eyes:     Conjunctiva/sclera:  Conjunctivae normal.  Neck:     Thyroid: No thyromegaly.     Trachea: Trachea normal. No tracheal tenderness or tracheal deviation.  Cardiovascular:     Rate and Rhythm: Normal rate and regular rhythm.     Heart sounds: Normal heart sounds, S1 normal and S2 normal. No murmur heard. Pulmonary:     Effort: No respiratory distress.     Breath sounds: Normal breath sounds. No stridor. No wheezing or rales.  Lymphadenopathy:     Head:     Right side of head: No tonsillar adenopathy.     Left side of head: No tonsillar adenopathy.     Cervical: No cervical adenopathy.  Skin:    Findings: No erythema or rash.     Nails: There is no clubbing.  Neurological:     Mental Status: He is alert.     Diagnostics: none  Assessment and Plan:   1. Nonallergic rhinitis   2. LPRD (laryngopharyngeal reflux disease)   3. 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 Obstructive sleep apnea     1.  Allergen avoidance measures -hymenoptera venom  2.  Use the following in the morning:   A. Flonase / Fluticasone - 1 spray each nostril  3. Use the following before CPAP mask use:   A. Afrin - 1 spray in single nostril. Alternate nostril every night  B. Flonase / Fluticasone - 1 spray each nostril  4.  Treat reflux/LPR with the following:   A.  Omeprazole 40 mg - 1 tablet in AM  B.  Famotidine 40 mg - 1 tablet in p.m.  5.  If needed:   A.  Nasal saline  B.  EpiPen  6.  Return to clinic summer 2024 or earlier if problem  I will have Teryn utilize the therapy noted above which includes consistent use of the nasal steroid and the lowest amount of Afrin required to keep his nose open so he can use a CPAP machine.  He had a very good response to treatment for LPR on his current plan and he can remain on a PPI and H2 receptor blocker.  I will see him back in this clinic in December 2024 or earlier if there  is a problem.  Allena Katz, MD Allergy / Immunology [place]

## 2022-06-25 ENCOUNTER — Encounter: Payer: Self-pay | Admitting: Allergy and Immunology

## 2022-07-31 ENCOUNTER — Ambulatory Visit (INDEPENDENT_AMBULATORY_CARE_PROVIDER_SITE_OTHER): Payer: Medicare Other | Admitting: Psychiatry

## 2022-07-31 ENCOUNTER — Encounter: Payer: Self-pay | Admitting: Psychiatry

## 2022-07-31 DIAGNOSIS — F5105 Insomnia due to other mental disorder: Secondary | ICD-10-CM | POA: Diagnosis not present

## 2022-07-31 DIAGNOSIS — F411 Generalized anxiety disorder: Secondary | ICD-10-CM

## 2022-07-31 MED ORDER — FLUOXETINE HCL 20 MG PO CAPS: 20.0000 mg | ORAL_CAPSULE | Freq: Every day | ORAL | 3 refills | Status: DC

## 2022-07-31 NOTE — Progress Notes (Signed)
[name] [id number] 1955/09/07 67 y.o.  Subjective:   Patient ID:  [name] is a 67 y.o. (DOB May 05, 1955) male.  Chief Complaint:  Chief Complaint  Patient presents with   Follow-up    HPI [name] presents to the office today for follow-up of generalized anxiety disorder with prominent irritability as a primary symptom. First visit 01/24/21 appt started sertraline 50 mg daily.  04/04/21 appt noted: She thinks it's helping some but not enough.  He thinks some is better. Still has irritability.  Always been kind of a nervous anxious person as was mother. SE constant GI gurgling very annoying.   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  That is a welcome benefit. Plan: Switch back to paroxetine at 20 mg daily DT GI SE sertraline 50 over 7 days.  06/05/2021 appointment with the following noted: Ok but knocks out sex drive and a little constipation.  Also gets a little shakey Anxiety and irritability are a little better but not gone.  Occ snappy.   Chronic GI sx are only a little better with paroxetine vs sertraline. Plan: Switch to fluoxetine 20 mg daily DT sexual SE paroxetine (Start fluoxetine 1 daily and reduce paroxetine to 1/2 daily for 1 week then stop paroxetine)  08/29/2021 appointment with the following noted: Doing good. Took awhile to help anger but less sexual Se than paroxetine. Anxiety and irritability manageable and better with meds. No sign SE Paxil helped neuropathy better.. Wants refill Lunesta. Sleep pretty good with Lunesta.   8 hours sleep without SE  Mind won't turn off without some sleeper and feels bad next day. Asked questions about Quiviviq.  01/30/22 appt noted: Overall been alright with occ anger flares.  Wife's slowness gets on his nerves. No problems with irritability or anger in public. Always had anxiety since a kid but retired it's better. Not depressed. [place]  Likes to [employer]. Remains on fluoxetine 20 mg  daily. Sleep good with Lunesta but without it has racing thoughts. No manic sx.  07/31/22 appt: Weaned off Lunesta bc wanted to be without it.  Doing ok .  Benadryl prn is ok.  6-7 hours of sleep. On fluoxetine 20. As only psych med.  No SE Mood is good.  Used to get anger outbursts and not hardly at all now. Retired 7 year.  Fishing and golf and yard work.  Some walking.    Past Psychiatric History: Dr. Jennelle Human from 2007-2017 Past Psychiatric Medication Trials: Zolpidem poor response, Ambien CR hangover Lunesta 2 effective Clonazepam 0.5 mg Paroxetine 10, citalopram 80, sertraline 100, Effexor 37.5,  Wellbutrin 150, Depakote fatigue Adderall, Ritalin Geodon panic, Seroquel hangover Lithium SE  Review of Systems:  Review of Systems  Genitourinary:        Sexual SE  Musculoskeletal:  Positive for arthralgias and back pain.  Neurological:  Negative for tremors and weakness.       Neuropathy  Psychiatric/Behavioral:  Negative for sleep disturbance.     Medications: I have reviewed the patient's current medications.  Current Outpatient Medications  Medication Sig Dispense Refill   amLODipine (NORVASC) 10 MG tablet Take 1 tablet (10 mg total) by mouth daily. 90 tablet 3   clopidogrel (PLAVIX) 75 MG tablet Take 1 tablet (75 mg total) by mouth daily. 90 tablet 3   EPINEPHrine 0.3 mg/0.3 mL IJ SOAJ injection Inject 0.3 mg into the muscle as needed for anaphylaxis. 2 each 1   Evolocumab (REPATHA SURECLICK)  140 MG/ML SOAJ Inject 140 mg into the skin every 14 (fourteen) days. 2 mL 12   famotidine (PEPCID) 40 MG tablet Take one tablet by mouth at bedtime. 30 tablet 5   finasteride (PROSCAR) 5 MG tablet Take 5 mg by mouth daily.     fluticasone (FLONASE) 50 MCG/ACT nasal spray Place 2 sprays into both nostrils daily.     niacinamide 500 MG tablet Take 500 mg by mouth daily in the afternoon.     nitroGLYCERIN (NITROSTAT) 0.4 MG SL tablet Place 1 tablet (0.4 mg total) under the tongue  every 5 (five) minutes as needed for chest pain. 25 tablet 4   rosuvastatin (CRESTOR) 20 MG tablet Take 2 tablets (40 mg total) by mouth daily. 180 tablet 1   RYALTRIS 665-25 MCG/ACT SUSP Use two sprays in each nostril twice daily. 29 g 5   valsartan (DIOVAN) 160 MG tablet Take 1 tablet (160 mg total) by mouth daily. 90 tablet 3   FLUoxetine (PROZAC) 20 MG capsule Take 1 capsule (20 mg total) by mouth daily. 90 capsule 3   No current facility-administered medications for this visit.    Medication Side Effects: GI and some sexual reduced interest  Allergies: No Known Allergies  Past Medical History:  Diagnosis Date   Anxiety    Arthritis    "mostly in my hands; sometimes other areas" (10/20/2013)   Bipolar disorder (HCC)    BPH (benign prostatic hyperplasia)    CAD S/P percutaneous coronary angioplasty 3/'13, 7/'15; 3/'21   (Therapeutic P2Y12 on Plavix); a) STEMI -m RCA DES PCI; 2015: Class III angina (abnl MYOVIEW w/ Ant-AntLat ischemia) -> m-d LAD 95-99% (PCI: Biotronik AG 2 DES 2.5 x 13, 2.75 mm); b. 3/'21 Cath/Staged PCI: LM nl, mLAD 60% (DFR .96), D1 65%  (DFR .95), mCx 60% (DFR .96); pRCA 60% - m-d 85&70% (CSI -> 3.5x38 Resolute DES p-m, 3.0x8 Resolute DES m-d connecting new & old  STENTS). EF 55-65%.   Depression    Dizziness of unknown cause January 2011   abn MRI, Neuro w/u Jan 2011   Dyslipidemia, goal LDL below 70    Essential hypertension    GERD (gastroesophageal reflux disease)    RA (rheumatoid arthritis) (HCC)    Rheumatic fever 1969   ST elevation myocardial infarction (STEMI) involving right coronary artery in recovery phase ([facility]) 06/2011   100% occluded RCA; PCI - 3.0x24 Promus DES (3.5-3.6 mm) ; Mod w moderate LAD & Cx disease, EF 55-60% w/ basal inferoseptal HK confirmed by echo    Past Medical History, Surgical history, Social history, and Family history were reviewed and updated as appropriate.   Please see review of systems for further details on the  patient's review from today.   Objective:   Physical Exam:  There were no vitals taken for this visit.  Physical Exam Constitutional:      General: He is not in acute distress. Musculoskeletal:        General: No deformity.  Neurological:     Mental Status: He is alert and oriented to person, place, and time.     Coordination: Coordination normal.  Psychiatric:        Attention and Perception: Attention and perception normal. He does not perceive auditory or visual hallucinations.        Mood and Affect: Mood is anxious. Mood is not depressed. Affect is not labile, blunt, angry, tearful or inappropriate.        Speech: Speech normal.  Behavior: Behavior normal. Behavior is not slowed.        Thought Content: Thought content normal. Thought content is not paranoid or delusional. Thought content does not include homicidal or suicidal ideation. Thought content does not include suicidal plan.        Cognition and Memory: Cognition and memory normal.        Judgment: Judgment normal.     Comments: Insight intact irritability and anxiety improved      Lab Review:     Component Value Date/Time   NA 142 02/09/2022 1044   K 4.2 02/09/2022 1044   CL 100 02/09/2022 1044   CO2 24 02/09/2022 1044   GLUCOSE 102 (H) 02/09/2022 1044   GLUCOSE 81 06/06/2019 0341   BUN 16 02/09/2022 1044   CREATININE 0.86 02/09/2022 1044   CREATININE 0.86 05/09/2016 1039   CALCIUM 9.7 02/09/2022 1044   PROT 7.3 02/09/2022 1044   ALBUMIN 5.2 (H) 02/09/2022 1044   AST 30 02/09/2022 1044   ALT 29 02/09/2022 1044   ALKPHOS 87 02/09/2022 1044   BILITOT 0.8 02/09/2022 1044   GFRNONAA >60 06/06/2019 0341   GFRNONAA >89 10/16/2013 1455   GFRAA >60 06/06/2019 0341   GFRAA >89 10/16/2013 1455       Component Value Date/Time   WBC 6.1 06/06/2019 0341   RBC 4.35 06/06/2019 0341   HGB 13.3 06/06/2019 0341   HGB 15.4 05/20/2019 1642   HCT 38.0 (L) 06/06/2019 0341   HCT 42.0 05/20/2019 1642   PLT  154 06/06/2019 0341   PLT 173 05/20/2019 1642   MCV 87.4 06/06/2019 0341   MCV 84 05/20/2019 1642   MCH 30.6 06/06/2019 0341   MCHC 35.0 06/06/2019 0341   RDW 12.6 06/06/2019 0341   RDW 13.1 05/20/2019 1642    No results found for: "POCLITH", "LITHIUM"   No results found for: "PHENYTOIN", "PHENOBARB", "VALPROATE", "CBMZ"   .res Assessment: Plan:    [name] was seen today for follow-up.  Diagnoses and all orders for this visit:  Generalized anxiety disorder -     FLUoxetine (PROZAC) 20 MG capsule; Take 1 capsule (20 mg total) by mouth daily.  Insomnia due to mental condition   Wife saw some degree of improvement with sertraline 50 mg in terms of his irritability.  He does not notice much change.  However he complains of side effects from the sertraline 50 mg with GI problems.  Sexual SE paroxetine Switch to fluoxetine 20 mg daily DT sexual SE paroxetine was successful.  Good sx control and SE minimal.  Done ok off Lunesta .  Rare SE he didn't want to risk.  Ok trial Benadryl prn  Fu 12 mos  Meredith Staggers, MD, DFAPA    Please see After Visit Summary for patient specific instructions.  Future Appointments  Date Time Provider Department Center  10/25/2022 10:20 AM Kozlow, Alvira Philips, MD [facility] None     No orders of the defined types were placed in this encounter.   -------------------------------

## 2022-08-02 ENCOUNTER — Telehealth: Payer: Self-pay | Admitting: Cardiology

## 2022-08-02 NOTE — Telephone Encounter (Signed)
Pt c/o medication issue:  1. Name of Medication: amLODipine (NORVASC) 10 MG tablet   2. How are you currently taking this medication (dosage and times per day)?    3. Are you having a reaction (difficulty breathing--STAT)? no  4. What is your medication issue? Patient calling in questions concerning medication. Please advise

## 2022-08-02 NOTE — Telephone Encounter (Signed)
Patient states he has been reading up on his medications and the side effects. He feels his amlodipine has been the cause of his back pain, extreme fatigue, vertigo and burning during urination. He states he sees a urologist and they have stated his urine is fine. He states his blood pressure be high in the mornings but normal range after medications.  He would like to stop amlodipine for a while to see if that relieve some of his symptoms. He does not have PCP at this time.

## 2022-08-04 NOTE — Telephone Encounter (Signed)
At night, and symptoms associated with amlodipine.  Amlodipine is somewhat important for him when it comes to antianginal benefit.  I am leery of what happens if we stop it altogether.  We can do a short trial run off of amlodipine but would like for him to double up his valsartan dose while holding it.  This will help keep the blood pressure from going up too high.  Also want to see how the chest pain/angina does.  Give this a month trial.  If there is no change in the urology symptoms and back pain then I do not think it is related to amlodipine and we can go back on it and go back to regular dose of ARB.  [name], MD

## 2022-08-06 NOTE — Telephone Encounter (Signed)
Left voicemail for patient to return call to office. 

## 2022-08-08 NOTE — Telephone Encounter (Signed)
Spoke to patient . He is aware. Patient aware stop Amlodipine 1 month  and  increase Valsartan 320 mg ( double the dose.) patient states he thinks he has enough medication if not he will call the office  Patient verbalized understanding

## 2022-08-20 ENCOUNTER — Other Ambulatory Visit (HOSPITAL_COMMUNITY): Payer: Self-pay

## 2022-09-02 ENCOUNTER — Other Ambulatory Visit: Payer: Self-pay | Admitting: Allergy and Immunology

## 2022-09-15 ENCOUNTER — Other Ambulatory Visit: Payer: Self-pay | Admitting: Allergy and Immunology

## 2022-10-03 ENCOUNTER — Other Ambulatory Visit: Payer: Self-pay

## 2022-10-03 MED ORDER — ROSUVASTATIN CALCIUM 20 MG PO TABS: 40.0000 mg | ORAL_TABLET | Freq: Every day | ORAL | 0 refills | Status: DC

## 2022-10-25 ENCOUNTER — Encounter: Payer: Self-pay | Admitting: Allergy and Immunology

## 2022-10-25 ENCOUNTER — Ambulatory Visit (INDEPENDENT_AMBULATORY_CARE_PROVIDER_SITE_OTHER): Payer: Medicare Other | Admitting: Allergy and Immunology

## 2022-10-25 VITALS — BP 122/90 | HR 76 | Resp 14 | Ht 70.0 in | Wt 196.8 lb

## 2022-10-25 DIAGNOSIS — J310 Chronic rhinitis: Secondary | ICD-10-CM

## 2022-10-25 DIAGNOSIS — K219 Gastro-esophageal reflux disease without esophagitis: Secondary | ICD-10-CM | POA: Diagnosis not present

## 2022-10-25 DIAGNOSIS — T63481A Toxic effect of venom of other arthropod, accidental (unintentional), initial encounter: Secondary | ICD-10-CM

## 2022-10-25 DIAGNOSIS — Z888 Allergy status to other drugs, medicaments and biological substances status: Secondary | ICD-10-CM | POA: Diagnosis not present

## 2022-10-25 MED ORDER — EPINEPHRINE 0.3 MG/0.3ML IJ SOAJ: INTRAMUSCULAR | 3 refills | Status: DC

## 2022-10-25 NOTE — Progress Notes (Signed)
[place] [facility] [place] - [place] - [place] - [place]   Follow-up Note  Referring Provider: No ref. provider found Primary Provider: Patient, No Pcp Per Date of Office Visit: 10/25/2022  Subjective:   [name] (DOB: 02/22/1956) is a 67 y.o. male who [facility] in re-evaluation of the following:  HPI: [name] returns to this clinic in evaluation of nonallergic rhinitis, LPR, sleep apnea, hymenoptera venom hypersensitivity state.  I last saw him in this clinic 21 June 2022.  He has slowly stopped the use of Flonase and has reverted to the use of Afrin twice a day to treat his nonallergic rhinitis and prior to the use of his CPAP machine.  His reflux is doing well on his current plan.  He has not been stung by hymenoptera.  Allergies as of 10/25/2022   No Known Allergies      Medication List    amLODipine 10 MG tablet Commonly known as: NORVASC Take 1 tablet (10 mg total) by mouth daily.   clopidogrel 75 MG tablet Commonly known as: PLAVIX Take 1 tablet (75 mg total) by mouth daily.   EPINEPHrine 0.3 mg/0.3 mL Soaj injection Commonly known as: EPI-PEN Use as directed for life-threatening allergic reaction.   famotidine 40 MG tablet Commonly known as: PEPCID Take one tablet by mouth at bedtime.   finasteride 5 MG tablet Commonly known as: PROSCAR Take 5 mg by mouth daily.   FLUoxetine 20 MG capsule Commonly known as: PROzac Take 1 capsule (20 mg total) by mouth daily.   fluticasone 50 MCG/ACT nasal spray Commonly known as: FLONASE Place 2 sprays into both nostrils daily.   nitroGLYCERIN 0.4 MG SL tablet Commonly known as: NITROSTAT Place 1 tablet (0.4 mg total) under the tongue every 5 (five) minutes as needed for chest pain.   omeprazole 40 MG capsule Commonly known as: PRILOSEC Take 40 mg by mouth daily.   RABEprazole 20 MG tablet Commonly known as: ACIPHEX Take 20 mg by mouth every morning.    Repatha SureClick 140 MG/ML Soaj Generic drug: Evolocumab Inject 140 mg into the skin every 14 (fourteen) days.   rosuvastatin 20 MG tablet Commonly known as: CRESTOR Take 2 tablets (40 mg total) by mouth daily.   valsartan 160 MG tablet Commonly known as: DIOVAN Take 1 tablet (160 mg total) by mouth daily.    Past Medical History:  Diagnosis Date   Anxiety    Arthritis    "mostly in my hands; sometimes other areas" (10/20/2013)   Bipolar disorder (HCC)    BPH (benign prostatic hyperplasia)    CAD S/P percutaneous coronary angioplasty 3/'13, 7/'15; 3/'21   (Therapeutic P2Y12 on Plavix); a) STEMI -m RCA DES PCI; 2015: Class III angina (abnl MYOVIEW w/ Ant-AntLat ischemia) -> m-d LAD 95-99% (PCI: Biotronik AG 2 DES 2.5 x 13, 2.75 mm); b. 3/'21 Cath/Staged PCI: LM nl, mLAD 60% (DFR .96), D1 65%  (DFR .95), mCx 60% (DFR .96); pRCA 60% - m-d 85&70% (CSI -> 3.5x38 Resolute DES p-m, 3.0x8 Resolute DES m-d connecting new & old  STENTS). EF 55-65%.   Depression    Dizziness of unknown cause January 2011   abn MRI, Neuro w/u Jan 2011   Dyslipidemia, goal LDL below 70    Essential hypertension    GERD (gastroesophageal reflux disease)    RA (rheumatoid arthritis) (HCC)    Rheumatic fever 1969   ST elevation myocardial infarction (STEMI) involving right coronary artery  in recovery phase (HCC) 06/2011   100% occluded RCA; PCI - 3.0x24 Promus DES (3.5-3.6 mm) ; Mod w moderate LAD & Cx disease, EF 55-60% w/ basal inferoseptal HK confirmed by echo    Past Surgical History:  Procedure Laterality Date   CORONARY ATHERECTOMY N/A 06/05/2019   Procedure: CORONARY ATHERECTOMY;  Surgeon: Marykay Lex, MD;  Location: [facility] INVASIVE CV LAB;  Service: Cardiovascular; orbital atherectomy-mid RCA   CORONARY PRESSURE/FFR STUDY N/A 06/02/2019   Procedure: INTRAVASCULAR PRESSURE WIRE/FFR STUDY;  Surgeon: Marykay Lex, MD;  Location: [facility] INVASIVE CV LAB;  Service: Cardiovascular;  LCA: mLAD 60% (DFR .96), D1  65%  (DFR .95), mCx 60% (DFR .96)   CORONARY STENT INTERVENTION N/A 06/05/2019   Procedure: CORONARY STENT INTERVENTION;  Surgeon: Marykay Lex, MD;  Location: [facility] INVASIVE CV LAB;  Service: Cardiovascular;; pRCA 60% - m-d 85&70% (CSI -> 3.5x38 Resolute DES p-m, 3.0x8 Resolute DES m-d connecting new & old  STENTS).  Underestimated eccentric lesion in the bend making it difficult to connect the new stent to the prior stent (was thought to be stent - but was calcification   LEFT HEART CATH AND CORONARY ANGIOGRAPHY N/A 06/02/2019   Procedure: LEFT HEART CATH AND CORONARY ANGIOGRAPHY;  Surgeon: Marykay Lex, MD;  Location: [facility] INVASIVE CV LAB;  Service: Cardiovascular;;; LM - nml; mLAD 60% (DFR .96), D1 65%  (DFR .95), mCx 60% (DFR .96); mRCA 60%, 85%&70% (staged CSI PCI)   LEFT HEART CATHETERIZATION WITH CORONARY ANGIOGRAM N/A 06/28/2011   Procedure: LEFT HEART CATHETERIZATION WITH CORONARY ANGIOGRAM;  Surgeon: Marykay Lex, MD;  Location: [facility] CATH LAB;  50% LAD after D1, D1 30%.  Small D2.  Large caliber LCx with (small OM1) major OM 2 and 3 followed by ABG circumflex with 40% focal lesion terminating with LPL 1.  100% mid RCA (DES PCI); beyond 100% RCA  - large PDA (double barrel) and small PAV-PL -EF 55 to 60% w/ midInf-LAf HK.   LEFT HEART CATHETERIZATION WITH CORONARY ANGIOGRAM N/A 10/20/2013   Procedure: LEFT HEART CATHETERIZATION WITH CORONARY ANGIOGRAM;  Surgeon: Marykay Lex, MD;  Location: [facility] CATH LAB;  Service: Cardiovascular;  Laterality: N/A;   NM MYOVIEW LTD N/A 10/19/2013   INTERMEDIATE RISK, septal, anterior-anterolateral ischemia --> referred for cath the following day -- LAD LESION TREATED WITH DES PCI   NM MYOVIEW LTD  05/23/2018   EF estimated 45 to 50%.  Hypertensive response to exercise.  Medium sized moderate severity defect in the basal inferior mid inferolateral wall (read as intermediate risk) -> the defect actually appears to improve on stress imaging suggesting hibernating  myocardium.   PERCUTANEOUS CORONARY STENT INTERVENTION (PCI-S) N/A 06/2011; 09/2013   a) 3/'13: 100%  RCA ->PCI 3.20x2 (3.6) Promus DES stent; 7/'15: mLAD PCI Biotronik AG DES 2.5 x13 2.75 mm), FFR D1 50-60% = 0.86 (small branch had ~70%), mCx ~50-60% FFR=1.0.   PLANTAR FASCIA SURGERY Left 2004   "& neuroma"   SHOULDER ARTHROSCOPY W/ ROTATOR CUFF REPAIR Left July 2011   TEMPORARY PACEMAKER N/A 06/05/2019   Procedure: TEMPORARY PACEMAKER;  Surgeon: Marykay Lex, MD;  Location: [facility] INVASIVE CV LAB;  Service: Cardiovascular;  Laterality: N/A;   TRANSTHORACIC ECHOCARDIOGRAM N/A 05/2016   EF ~55%. Normal LV Size & function. NO RWMA (inferior HK no longer present). Mlid LVH - Gr 1 DD.. Mild LA dilation.     Review of systems negative except as noted in HPI / PMHx or noted below:  Review of Systems  Constitutional: Negative.   HENT: Negative.    Eyes: Negative.   Respiratory: Negative.    Cardiovascular: Negative.   Gastrointestinal: Negative.   Genitourinary: Negative.   Musculoskeletal: Negative.   Skin: Negative.   Neurological: Negative.   Endo/Heme/Allergies: Negative.   Psychiatric/Behavioral: Negative.       Objective:   Vitals:   10/25/22 1038  BP: (!) 122/90  Pulse: 76  Resp: 14  SpO2: 96%   Height: 5\' 10"  (177.8 cm)  Weight: 196 lb 12.8 oz (89.3 kg)   Physical Exam Constitutional:      Appearance: He is not diaphoretic.  HENT:     Head: Normocephalic.     Right Ear: Tympanic membrane, ear canal and external ear normal.     Left Ear: Tympanic membrane, ear canal and external ear normal.     Nose: Nose normal. No mucosal edema or rhinorrhea.     Mouth/Throat:     Pharynx: Uvula midline. No oropharyngeal exudate.  Eyes:     Conjunctiva/sclera: Conjunctivae normal.  Neck:     Thyroid: No thyromegaly.     Trachea: Trachea normal. No tracheal tenderness or tracheal deviation.  Cardiovascular:     Rate and Rhythm: Normal rate and regular rhythm.     Heart sounds:  Normal heart sounds, S1 normal and S2 normal. No murmur heard. Pulmonary:     Effort: No respiratory distress.     Breath sounds: Normal breath sounds. No stridor. No wheezing or rales.  Lymphadenopathy:     Head:     Right side of head: No tonsillar adenopathy.     Left side of head: No tonsillar adenopathy.     Cervical: No cervical adenopathy.  Skin:    Findings: No erythema or rash.     Nails: There is no clubbing.  Neurological:     Mental Status: He is alert.     Diagnostics: none  Assessment and Plan:   1. Nonallergic rhinitis   2. Rhinitis medicamentosa   3. LPRD (laryngopharyngeal reflux disease)   4. Hymenoptera reaction    1.  Allergen avoidance measures -hymenoptera venom  2.  Use the following in the morning:   A. Flonase / Fluticasone - 1 spray each nostril  3. Use the following before CPAP mask use:   A. Afrin - 1 spray in single nostril. Alternate nostril every night  B. Flonase / Fluticasone - 1 spray each nostril  4.  Treat reflux/LPR with the following:   A.  Omeprazole 40 mg - 1 tablet in AM  B.  Famotidine 40 mg - 1 tablet in p.m.  5.  If needed:   A.  Nasal saline  B.  EpiPen  6.  Return to clinic 12 months or earlier if problem  7. Plan for fall flu vaccine  Lorrie needs to use the nasal steroid when he uses Afrin and he needs to decrease his use of his Afrin and reserve it only for single nostril every night alternating nostrils from night to night.  I had a talk with him about this issue once again.  He will continue to treat his LPR with the therapy noted above which is actually doing quite well.  I will see him back in this clinic in 1 year or earlier if there is a problem.  Laurette Schimke, MD Allergy / Immunology [facility]

## 2022-10-25 NOTE — Patient Instructions (Signed)
  1.  Allergen avoidance measures -hymenoptera venom  2.  Use the following in the morning:   A. Flonase / Fluticasone - 1 spray each nostril  3. Use the following before CPAP mask use:   A. Afrin - 1 spray in single nostril. Alternate nostril every night  B. Flonase / Fluticasone - 1 spray each nostril  4.  Treat reflux/LPR with the following:   A.  Omeprazole 40 mg - 1 tablet in AM  B.  Famotidine 40 mg - 1 tablet in p.m.  5.  If needed:   A.  Nasal saline  B.  EpiPen  6.  Return to clinic 12 months or earlier if problem  7. Plan for fall flu vaccine

## 2022-10-29 ENCOUNTER — Encounter: Payer: Self-pay | Admitting: Allergy and Immunology

## 2022-10-31 ENCOUNTER — Telehealth: Payer: Self-pay | Admitting: Cardiology

## 2022-10-31 NOTE — Telephone Encounter (Signed)
*  STAT* If patient is at the pharmacy, call can be transferred to refill team.   1. Which medications need to be refilled? (please list name of each medication and dose if known) Clopidogrel   2. Would you like to learn more about the convenience, safety, & potential cost savings by using the [facility]?     3. Are you open to using the [facility] (Type [facility]. .   4. Which pharmacy/location (including street and city if local pharmacy) is medication to be sent to? [facility] [place]  [phone]  5. Do they need a 30 day or 90 day supply?  Need enough his appointment on 12-14-22- please call in today, been out a few days

## 2022-11-01 ENCOUNTER — Other Ambulatory Visit: Payer: Self-pay

## 2022-11-01 MED ORDER — VALSARTAN 160 MG PO TABS: 160.0000 mg | ORAL_TABLET | Freq: Every day | ORAL | 0 refills | Status: DC

## 2022-11-01 MED ORDER — CLOPIDOGREL BISULFATE 75 MG PO TABS: 75.0000 mg | ORAL_TABLET | Freq: Every day | ORAL | 1 refills | Status: DC

## 2022-11-01 NOTE — Telephone Encounter (Signed)
Pt's medication was sent to pt's pharmacy as requested. Confirmation received.  °

## 2022-12-01 ENCOUNTER — Other Ambulatory Visit: Payer: Self-pay | Admitting: Cardiology

## 2022-12-12 NOTE — Progress Notes (Deleted)
Cardiology Clinic Note   Patient Name: [name] Date of Encounter: 12/12/2022  Primary Care Provider:  Patient, No Pcp Per Primary Cardiologist:  [name], MD  Patient Profile    67 year old male with history of coronary artery disease with drug-eluting stent to the RCA (DES 3.1 mm x 24 mm), (DES to the LAD 2.5 mm x 13 mm), Hypertension, hyperlipidemia, stable angina with symptoms manifested as exertional dyspnea, fatigue, and sweating (chronic).  He is on Plavix but can stop for 5 to 7 days for surgical procedures.    He notified our office in May 2024 that amlodipine was causing back pain and extreme fatigue with vertigo and burning during urination.  Dr. Herbie Baltimore said that he could stop it for a week to see if this improves his symptoms although he doubted that the amlodipine was the cause.  He was to follow-up with PCP.  He was advised to go back on amlodipine if they are were no changes in his symptoms, as he was having benefit of antianginal properties with amlodipine.  Past Medical History    Past Medical History:  Diagnosis Date   Anxiety    Arthritis    "mostly in my hands; sometimes other areas" (10/20/2013)   Bipolar disorder (HCC)    BPH (benign prostatic hyperplasia)    CAD S/P percutaneous coronary angioplasty 3/'13, 7/'15; 3/'21   (Therapeutic P2Y12 on Plavix); a) STEMI -m RCA DES PCI; 2015: Class III angina (abnl MYOVIEW w/ Ant-AntLat ischemia) -> m-d LAD 95-99% (PCI: Biotronik AG 2 DES 2.5 x 13, 2.75 mm); b. 3/'21 Cath/Staged PCI: LM nl, mLAD 60% (DFR .96), D1 65%  (DFR .95), mCx 60% (DFR .96); pRCA 60% - m-d 85&70% (CSI -> 3.5x38 Resolute DES p-m, 3.0x8 Resolute DES m-d connecting new & old  STENTS). EF 55-65%.   Depression    Dizziness of unknown cause January 2011   abn MRI, Neuro w/u Jan 2011   Dyslipidemia, goal LDL below 70    Essential hypertension    GERD (gastroesophageal reflux disease)    RA (rheumatoid arthritis) (HCC)    Rheumatic fever 1969    ST elevation myocardial infarction (STEMI) involving right coronary artery in recovery phase (HCC) 06/2011   100% occluded RCA; PCI - 3.0x24 Promus DES (3.5-3.6 mm) ; Mod w moderate LAD & Cx disease, EF 55-60% w/ basal inferoseptal HK confirmed by echo   Past Surgical History:  Procedure Laterality Date   CORONARY ATHERECTOMY N/A 06/05/2019   Procedure: CORONARY ATHERECTOMY;  Surgeon: Marykay Lex, MD;  Location: [facility] INVASIVE CV LAB;  Service: Cardiovascular; orbital atherectomy-mid RCA   CORONARY PRESSURE/FFR STUDY N/A 06/02/2019   Procedure: INTRAVASCULAR PRESSURE WIRE/FFR STUDY;  Surgeon: Marykay Lex, MD;  Location: [facility] INVASIVE CV LAB;  Service: Cardiovascular;  LCA: mLAD 60% (DFR .96), D1 65%  (DFR .95), mCx 60% (DFR .96)   CORONARY STENT INTERVENTION N/A 06/05/2019   Procedure: CORONARY STENT INTERVENTION;  Surgeon: Marykay Lex, MD;  Location: [facility] INVASIVE CV LAB;  Service: Cardiovascular;; pRCA 60% - m-d 85&70% (CSI -> 3.5x38 Resolute DES p-m, 3.0x8 Resolute DES m-d connecting new & old  STENTS).  Underestimated eccentric lesion in the bend making it difficult to connect the new stent to the prior stent (was thought to be stent - but was calcification   LEFT HEART CATH AND CORONARY ANGIOGRAPHY N/A 06/02/2019   Procedure: LEFT HEART CATH AND CORONARY ANGIOGRAPHY;  Surgeon: Marykay Lex, MD;  Location: [facility] INVASIVE CV  LAB;  Service: Cardiovascular;;; LM - nml; mLAD 60% (DFR .96), D1 65%  (DFR .95), mCx 60% (DFR .96); mRCA 60%, 85%&70% (staged CSI PCI)   LEFT HEART CATHETERIZATION WITH CORONARY ANGIOGRAM N/A 06/28/2011   Procedure: LEFT HEART CATHETERIZATION WITH CORONARY ANGIOGRAM;  Surgeon: Marykay Lex, MD;  Location: [facility] CATH LAB;  50% LAD after D1, D1 30%.  Small D2.  Large caliber LCx with (small OM1) major OM 2 and 3 followed by ABG circumflex with 40% focal lesion terminating with LPL 1.  100% mid RCA (DES PCI); beyond 100% RCA  - large PDA (double barrel) and small PAV-PL -EF 55  to 60% w/ midInf-LAf HK.   LEFT HEART CATHETERIZATION WITH CORONARY ANGIOGRAM N/A 10/20/2013   Procedure: LEFT HEART CATHETERIZATION WITH CORONARY ANGIOGRAM;  Surgeon: Marykay Lex, MD;  Location: [facility] CATH LAB;  Service: Cardiovascular;  Laterality: N/A;   NM MYOVIEW LTD N/A 10/19/2013   INTERMEDIATE RISK, septal, anterior-anterolateral ischemia --> referred for cath the following day -- LAD LESION TREATED WITH DES PCI   NM MYOVIEW LTD  05/23/2018   EF estimated 45 to 50%.  Hypertensive response to exercise.  Medium sized moderate severity defect in the basal inferior mid inferolateral wall (read as intermediate risk) -> the defect actually appears to improve on stress imaging suggesting hibernating myocardium.   PERCUTANEOUS CORONARY STENT INTERVENTION (PCI-S) N/A 06/2011; 09/2013   a) 3/'13: 100%  RCA ->PCI 3.20x2 (3.6) Promus DES stent; 7/'15: mLAD PCI Biotronik AG DES 2.5 x13 2.75 mm), FFR D1 50-60% = 0.86 (small branch had ~70%), mCx ~50-60% FFR=1.0.   PLANTAR FASCIA SURGERY Left 2004   "& neuroma"   SHOULDER ARTHROSCOPY W/ ROTATOR CUFF REPAIR Left July 2011   TEMPORARY PACEMAKER N/A 06/05/2019   Procedure: TEMPORARY PACEMAKER;  Surgeon: Marykay Lex, MD;  Location: [facility] INVASIVE CV LAB;  Service: Cardiovascular;  Laterality: N/A;   TRANSTHORACIC ECHOCARDIOGRAM N/A 05/2016   EF ~55%. Normal LV Size & function. NO RWMA (inferior HK no longer present). Mlid LVH - Gr 1 DD.. Mild LA dilation.     Allergies  No Known Allergies  History of Present Illness    ***  Home Medications    Current Outpatient Medications  Medication Sig Dispense Refill   amLODipine (NORVASC) 10 MG tablet Take 1 tablet (10 mg total) by mouth daily. PT. WILL NEED TO MAKE AN APPOINTMENT IN ORDER TO RECEIVE FUTURE REFILLS. FIRST ATTEMPT. 30 tablet 0   clopidogrel (PLAVIX) 75 MG tablet Take 1 tablet (75 mg total) by mouth daily. 30 tablet 1   EPINEPHrine 0.3 mg/0.3 mL IJ SOAJ injection Use as directed for  life-threatening allergic reaction. 2 each 3   Evolocumab (REPATHA SURECLICK) 140 MG/ML SOAJ Inject 140 mg into the skin every 14 (fourteen) days. 2 mL 12   famotidine (PEPCID) 40 MG tablet Take one tablet by mouth at bedtime. 30 tablet 5   finasteride (PROSCAR) 5 MG tablet Take 5 mg by mouth daily.     FLUoxetine (PROZAC) 20 MG capsule Take 1 capsule (20 mg total) by mouth daily. 90 capsule 3   fluticasone (FLONASE) 50 MCG/ACT nasal spray Place 2 sprays into both nostrils daily. (Patient not taking: Reported on 10/25/2022)     nitroGLYCERIN (NITROSTAT) 0.4 MG SL tablet Place 1 tablet (0.4 mg total) under the tongue every 5 (five) minutes as needed for chest pain. 25 tablet 4   omeprazole (PRILOSEC) 40 MG capsule Take 40 mg by mouth daily.  RABEprazole (ACIPHEX) 20 MG tablet Take 20 mg by mouth every morning.     rosuvastatin (CRESTOR) 20 MG tablet Take 2 tablets (40 mg total) by mouth daily. 180 tablet 0   valsartan (DIOVAN) 160 MG tablet Take 1 tablet (160 mg total) by mouth daily. 90 tablet 0   No current facility-administered medications for this visit.     Family History    Family History  Problem Relation Age of Onset   Cancer - Colon Mother 69   Skin cancer Mother    Heart attack Father 51       open heart surgery/valve replaced   Pneumonia Father        Died from complications in 01-09-2018   Lupus Father        Diagnosed just prior to death   Heart failure Maternal Grandmother 72   Cancer Paternal Grandmother 14   Heart failure Paternal Grandfather 38   He indicated that his mother is alive. He indicated that his father is deceased. He indicated that his maternal grandmother is deceased. He indicated that his maternal grandfather is deceased. He indicated that his paternal grandmother is deceased. He indicated that his paternal grandfather is deceased.  Social History    Social History   Socioeconomic History   Marital status: Married    Spouse name: Not on file   Number  of children: 2   Years of education: Not on file   Highest education level: Not on file  Occupational History   Occupation: Mail carrier    Employer: [employer]    Comment: Retired in April 2017  Tobacco Use   Smoking status: Never   Smokeless tobacco: Never  Substance and Sexual Activity   Alcohol use: No   Drug use: No   Sexual activity: Not Currently  Other Topics Concern   Not on file  Social History Narrative   Married father of 2. Never smoked. Does not drink alcohol significantly.   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   He does play golf on a fairly regular basis, but is currently not walking between holes, but riding. He is starting to play with a new partner who likes to walk, so he is hoping to get more walking.   Social Determinants of Health   Financial Resource Strain: Not on file  Food Insecurity: Not on file  Transportation Needs: Not on file  Physical Activity: Not on file  Stress: Not on file  Social Connections: Not on file  Intimate Partner Violence: Not on file     Review of Systems    General:  No chills, fever, night sweats or weight changes.  Cardiovascular:  No chest pain, dyspnea on exertion, edema, orthopnea, palpitations, paroxysmal nocturnal dyspnea. Dermatological: No rash, lesions/masses Respiratory: No cough, dyspnea Urologic: No hematuria, dysuria Abdominal:   No nausea, vomiting, diarrhea, bright red blood per rectum, melena, or hematemesis Neurologic:  No visual changes, wkns, changes in mental status. All other systems reviewed and are otherwise negative except as noted above.       Physical Exam    VS:  There were no vitals taken for this visit. , BMI There is no height or weight on file to calculate BMI.     GEN: Well nourished, well developed, in no acute distress. HEENT: normal. Neck: Supple, no JVD, carotid bruits,  or masses.  Cardiac: RRR, no murmurs, rubs, or gallops. No clubbing, cyanosis, edema.  Radials/DP/PT 2+ and equal bilaterally.  Respiratory:  Respirations regular and unlabored, clear to auscultation bilaterally. GI: Soft, nontender, nondistended, BS + x 4. MS: no deformity or atrophy. Skin: warm and dry, no rash. Neuro:  Strength and sensation are intact. Psych: Normal affect.      Lab Results  Component Value Date   WBC 6.1 06/06/2019   HGB 13.3 06/06/2019   HCT 38.0 (L) 06/06/2019   MCV 87.4 06/06/2019   PLT 154 06/06/2019   Lab Results  Component Value Date   CREATININE 0.86 02/09/2022   BUN 16 02/09/2022   NA 142 02/09/2022   K 4.2 02/09/2022   CL 100 02/09/2022   CO2 24 02/09/2022   Lab Results  Component Value Date   ALT 29 02/09/2022   AST 30 02/09/2022   ALKPHOS 87 02/09/2022   BILITOT 0.8 02/09/2022   Lab Results  Component Value Date   CHOL 142 02/09/2022   HDL 38 (L) 02/09/2022   LDLCALC 73 02/09/2022   TRIG 182 (H) 02/09/2022   CHOLHDL 3.7 02/09/2022    Lab Results  Component Value Date   HGBA1C 5.5 06/28/2011     Review of Prior Studies    Coronary angiography: Prox RCA lesion is 60% stenosed. Mid RCA-1 lesion is 85% stenosed. Mid RCA-2 lesion is 70% stenosed. Previously placed Mid RCA to Dist RCA drug-eluting stent is widely patent. Mid LAD lesion is 60% stenosed -DFR 0.96 (nonsignificant) Previously placed Dist LAD drug-eluting stent is widely patent. 1st Diag lesion is 65% stenosed (prior to bifurcation) -DFR 0.95 (nonsignificant) Mid Cx lesion is 60% stenosed (napkin ring lesion).-DFR 0.96 (nonsignificant) --Hemodynamics The left ventricular systolic function is normal. The left ventricular ejection fraction is 55-65% by visual estimate. LV end diastolic pressure is moderately elevated.   SUMMARY Severe RIGHT CORONARY ARTERY disease with tandem 80 and 70% lesions, heavily calcified as likely culprit (prior to previous stent) Moderate  (DFR negative) proximal D1, mid LAD and mid LCx lesions with otherwise widely patent distal LAD stent. Normal LVEF and EDP.   RECOMMENDATIONS With significant contrast load during the diagnostic catheterization, plan will be to discharge home today and schedule staged orbital atherectomy based PCI of the RCA on Friday, March 5.  (Third case) Of note, the patient will need to quarantine between now on that day in order to keep his current COVID-19 status valid. Continue other home medicines. Diagnostic Dominance: Right      Assessment & Plan   1.  ***     {Are you ordering a CV Procedure (e.g. stress test, cath, DCCV, TEE, etc)?   Press F2        :[id number]}   Signed, Bettey Mare. Liborio Nixon, ANP, AACC   12/12/2022 2:32 PM      Office [phone] Fax [phone]  Notice: This dictation was prepared with Dragon dictation along with smaller phrase technology. Any transcriptional errors that result from this process are unintentional and may not be corrected upon review.

## 2022-12-14 ENCOUNTER — Ambulatory Visit: Payer: Medicare Other | Admitting: Adult Health

## 2022-12-16 ENCOUNTER — Other Ambulatory Visit: Payer: Self-pay | Admitting: Allergy and Immunology

## 2022-12-16 ENCOUNTER — Other Ambulatory Visit: Payer: Self-pay | Admitting: Cardiology

## 2022-12-16 NOTE — Progress Notes (Unsigned)
Patient Name: [name] Date of Encounter: 12/18/2022  Primary Care Provider:  Patient, No Pcp Per Primary Cardiologist:  [name], MD  Patient Profile    67 year old male with history of CAD, inferior STEMI 2013 (DES in RCA - Promus DES 3.0 mm x 24 mm (post-dilated to 3.72m )  HTN, HL. Was seen by Dr.Harding on 10/31/2021 and was restarted on amlodipine 10 mg as he had run out, continued on rosuvastatin 40 mg daily.   Past Medical History    Past Medical History:  Diagnosis Date   Anxiety    Arthritis    "mostly in my hands; sometimes other areas" (10/20/2013)   Bipolar disorder (HCC)    BPH (benign prostatic hyperplasia)    CAD S/P percutaneous coronary angioplasty 3/'13, 7/'15; 3/'21   (Therapeutic P2Y12 on Plavix); a) STEMI -m RCA DES PCI; 2015: Class III angina (abnl MYOVIEW w/ Ant-AntLat ischemia) -> m-d LAD 95-99% (PCI: Biotronik AG 2 DES 2.5 x 13, 2.75 mm); b. 3/'21 Cath/Staged PCI: LM nl, mLAD 60% (DFR .96), D1 65%  (DFR .95), mCx 60% (DFR .96); pRCA 60% - m-d 85&70% (CSI -> 3.5x38 Resolute DES p-m, 3.0x8 Resolute DES m-d connecting new & old  STENTS). EF 55-65%.   Depression    Dizziness of unknown cause January 2011   abn MRI, Neuro w/u Jan 2011   Dyslipidemia, goal LDL below 70    Essential hypertension    GERD (gastroesophageal reflux disease)    RA (rheumatoid arthritis) (HCC)    Rheumatic fever 1969   ST elevation myocardial infarction (STEMI) involving right coronary artery in recovery phase (HCC) 06/2011   100% occluded RCA; PCI - 3.0x24 Promus DES (3.5-3.6 mm) ; Mod w moderate LAD & Cx disease, EF 55-60% w/ basal inferoseptal HK confirmed by echo   Past Surgical History:  Procedure Laterality Date   CORONARY ATHERECTOMY N/A 06/05/2019   Procedure: CORONARY ATHERECTOMY;  Surgeon: Marykay Lex, MD;  Location: [facility] INVASIVE CV LAB;  Service: Cardiovascular; orbital atherectomy-mid RCA   CORONARY PRESSURE/FFR STUDY N/A 06/02/2019   Procedure: INTRAVASCULAR  PRESSURE WIRE/FFR STUDY;  Surgeon: Marykay Lex, MD;  Location: [facility] INVASIVE CV LAB;  Service: Cardiovascular;  LCA: mLAD 60% (DFR .96), D1 65%  (DFR .95), mCx 60% (DFR .96)   CORONARY STENT INTERVENTION N/A 06/05/2019   Procedure: CORONARY STENT INTERVENTION;  Surgeon: Marykay Lex, MD;  Location: [facility] INVASIVE CV LAB;  Service: Cardiovascular;; pRCA 60% - m-d 85&70% (CSI -> 3.5x38 Resolute DES p-m, 3.0x8 Resolute DES m-d connecting new & old  STENTS).  Underestimated eccentric lesion in the bend making it difficult to connect the new stent to the prior stent (was thought to be stent - but was calcification   LEFT HEART CATH AND CORONARY ANGIOGRAPHY N/A 06/02/2019   Procedure: LEFT HEART CATH AND CORONARY ANGIOGRAPHY;  Surgeon: Marykay Lex, MD;  Location: [facility] INVASIVE CV LAB;  Service: Cardiovascular;;; LM - nml; mLAD 60% (DFR .96), D1 65%  (DFR .95), mCx 60% (DFR .96); mRCA 60%, 85%&70% (staged CSI PCI)   LEFT HEART CATHETERIZATION WITH CORONARY ANGIOGRAM N/A 06/28/2011   Procedure: LEFT HEART CATHETERIZATION WITH CORONARY ANGIOGRAM;  Surgeon: Marykay Lex, MD;  Location: [facility] CATH LAB;  50% LAD after D1, D1 30%.  Small D2.  Large caliber LCx with (small OM1) major OM 2 and 3 followed by ABG circumflex with 40% focal lesion terminating with LPL 1.  100% mid RCA (DES PCI); beyond 100%  RCA  - large PDA (double barrel) and small PAV-PL -EF 55 to 60% w/ midInf-LAf HK.   LEFT HEART CATHETERIZATION WITH CORONARY ANGIOGRAM N/A 10/20/2013   Procedure: LEFT HEART CATHETERIZATION WITH CORONARY ANGIOGRAM;  Surgeon: Marykay Lex, MD;  Location: [facility] CATH LAB;  Service: Cardiovascular;  Laterality: N/A;   NM MYOVIEW LTD N/A 10/19/2013   INTERMEDIATE RISK, septal, anterior-anterolateral ischemia --> referred for cath the following day -- LAD LESION TREATED WITH DES PCI   NM MYOVIEW LTD  05/23/2018   EF estimated 45 to 50%.  Hypertensive response to exercise.  Medium sized moderate severity defect in the basal  inferior mid inferolateral wall (read as intermediate risk) -> the defect actually appears to improve on stress imaging suggesting hibernating myocardium.   PERCUTANEOUS CORONARY STENT INTERVENTION (PCI-S) N/A 06/2011; 09/2013   a) 3/'13: 100%  RCA ->PCI 3.20x2 (3.6) Promus DES stent; 7/'15: mLAD PCI Biotronik AG DES 2.5 x13 2.75 mm), FFR D1 50-60% = 0.86 (small branch had ~70%), mCx ~50-60% FFR=1.0.   PLANTAR FASCIA SURGERY Left 2004   "& neuroma"   SHOULDER ARTHROSCOPY W/ ROTATOR CUFF REPAIR Left July 2011   TEMPORARY PACEMAKER N/A 06/05/2019   Procedure: TEMPORARY PACEMAKER;  Surgeon: Marykay Lex, MD;  Location: [facility] INVASIVE CV LAB;  Service: Cardiovascular;  Laterality: N/A;   TRANSTHORACIC ECHOCARDIOGRAM N/A 05/2016   EF ~55%. Normal LV Size & function. NO RWMA (inferior HK no longer present). Mlid LVH - Gr 1 DD.. Mild LA dilation.     Allergies  No Known Allergies  History of Present Illness    Mr. Ladona Ridgel comes today with his wife without any significant cardiac complaints.  He states he is losing his energy lately.  He states that he gives out easily working out in his yard or working on his boat.  He says this has been progressive.  He denies chest pain, palpitations, dyspnea on exertion, but he does feel extremely tired after doing any work outside at home.  He has been medically compliant.  He has been on valsartan 160 mg (1/2 tablet) daily, continues on amlodipine as directed.  He denies any bleeding or hemoptysis or significant bruising on clopidogrel.  He is tolerating Repatha.  Home Medications    Current Outpatient Medications  Medication Sig Dispense Refill   amLODipine (NORVASC) 10 MG tablet Take 1 tablet (10 mg total) by mouth daily. PT. WILL NEED TO MAKE AN APPOINTMENT IN ORDER TO RECEIVE FUTURE REFILLS. FIRST ATTEMPT. 30 tablet 0   clopidogrel (PLAVIX) 75 MG tablet take ONE tablet by MOUTH daily 30 tablet 0   EPINEPHrine 0.3 mg/0.3 mL IJ SOAJ injection Use as directed  for life-threatening allergic reaction. 2 each 3   Evolocumab (REPATHA SURECLICK) 140 MG/ML SOAJ Inject 140 mg into the skin every 14 (fourteen) days. 2 mL 12   famotidine (PEPCID) 40 MG tablet Take one tablet by mouth at bedtime. 30 tablet 5   finasteride (PROSCAR) 5 MG tablet Take 5 mg by mouth daily.     FLUoxetine (PROZAC) 20 MG capsule Take 1 capsule (20 mg total) by mouth daily. 90 capsule 3   fluticasone (FLONASE) 50 MCG/ACT nasal spray Place 2 sprays into both nostrils daily.     nitroGLYCERIN (NITROSTAT) 0.4 MG SL tablet Place 1 tablet (0.4 mg total) under the tongue every 5 (five) minutes as needed for chest pain. 25 tablet 4   RABEprazole (ACIPHEX) 20 MG tablet Take 20 mg by mouth every morning.  rosuvastatin (CRESTOR) 20 MG tablet Take 2 tablets (40 mg total) by mouth daily. 180 tablet 0   valsartan (DIOVAN) 160 MG tablet Take 1 tablet (160 mg total) by mouth daily. 90 tablet 0   omeprazole (PRILOSEC) 40 MG capsule Take 40 mg by mouth daily.     No current facility-administered medications for this visit.     Family History    Family History  Problem Relation Age of Onset   Cancer - Colon Mother 66   Skin cancer Mother    Heart attack Father 22       open heart surgery/valve replaced   Pneumonia Father        Died from complications in 01/08/2018   Lupus Father        Diagnosed just prior to death   Heart failure Maternal Grandmother 75   Cancer Paternal Grandmother 75   Heart failure Paternal Grandfather 38   He indicated that his mother is alive. He indicated that his father is deceased. He indicated that his maternal grandmother is deceased. He indicated that his maternal grandfather is deceased. He indicated that his paternal grandmother is deceased. He indicated that his paternal grandfather is deceased.  Social History    Social History   Socioeconomic History   Marital status: Married    Spouse name: Not on file   Number of children: 2   Years of education:  Not on file   Highest education level: Not on file  Occupational History   Occupation: Mail carrier    Employer: [employer]    Comment: Retired in April 2017  Tobacco Use   Smoking status: Never   Smokeless tobacco: Never  Substance and Sexual Activity   Alcohol use: No   Drug use: No   Sexual activity: Not Currently  Other Topics Concern   Not on file  Social History Narrative   Married father of 2. Never smoked. Does not drink alcohol significantly.   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   He does play golf on a fairly regular basis, but is currently not walking between holes, but riding. He is starting to play with a new partner who likes to walk, so he is hoping to get more walking.   Social Determinants of Health   Financial Resource Strain: Not on file  Food Insecurity: Not on file  Transportation Needs: Not on file  Physical Activity: Not on file  Stress: Not on file  Social Connections: Not on file  Intimate Partner Violence: Not on file     Review of Systems    General:  No chills, fever, night sweats or weight changes.  Worsening fatigue with exertion. Cardiovascular:  No chest pain, dyspnea on exertion, edema, orthopnea, palpitations, paroxysmal nocturnal dyspnea. Dermatological: No rash, lesions/masses Respiratory: No cough, dyspnea Urologic: No hematuria, dysuria Abdominal:   No nausea, vomiting, diarrhea, bright red blood per rectum, melena, or hematemesis Neurologic:  No visual changes, wkns, changes in mental status. All other systems reviewed and are otherwise negative except as noted above.       Physical Exam    VS:  BP 120/80   Pulse 66   Ht 5\' 10"  (1.778 m)   Wt 195 lb (88.5 kg)   SpO2 98%   BMI 27.98 kg/m  , BMI Body mass index is 27.98 kg/m.  GEN: Well nourished, well developed, in no acute distress. HEENT: normal. Neck:  Supple, no JVD, carotid bruits, or masses. Cardiac: RRR, 2/6 systolic murmurs, rubs, or gallops. No clubbing, cyanosis, edema.  Radials/DP/PT 2+ and equal bilaterally.  Respiratory:  Respirations regular and unlabored, clear to auscultation bilaterally. GI: Soft, nontender, nondistended, BS + x 4. MS: no deformity or atrophy. Skin: warm and dry, no rash. Neuro:  Strength and sensation are intact. Psych: Normal affect.      Lab Results  Component Value Date   WBC 6.1 06/06/2019   HGB 13.3 06/06/2019   HCT 38.0 (L) 06/06/2019   MCV 87.4 06/06/2019   PLT 154 06/06/2019   Lab Results  Component Value Date   CREATININE 0.86 02/09/2022   BUN 16 02/09/2022   NA 142 02/09/2022   K 4.2 02/09/2022   CL 100 02/09/2022   CO2 24 02/09/2022   Lab Results  Component Value Date   ALT 29 02/09/2022   AST 30 02/09/2022   ALKPHOS 87 02/09/2022   BILITOT 0.8 02/09/2022   Lab Results  Component Value Date   CHOL 142 02/09/2022   HDL 38 (L) 02/09/2022   LDLCALC 73 02/09/2022   TRIG 182 (H) 02/09/2022   CHOLHDL 3.7 02/09/2022    Lab Results  Component Value Date   HGBA1C 5.5 06/28/2011     Review of Prior Studies     Cardiac Cath 06/02/2019: Severe RCA disease with tandem 80 and 70% lesions, heavily calcified as likely culprit (prior to previous stent); Moderate (DFR negative) prox D1, mid LAD & mid LCx lesions with otherwise widely patent distal LAD stent; Normal LVEF and EDP.        Assessment & Plan   1.  Coronary artery disease: Multiple stents.  Most recent per cardiac catheterization as described above.  From his drug-eluting stent to the right coronary artery with tandem stents noted.  He is complaining of worsening fatigue with exertion.  He has been medically compliant.  He denies any chest pain or shortness of breath.  I will plan a nuclear medicine stress test to evaluate for ischemia in this territory causing him to have decreased energy.  He is not having actual angina  symptoms of chest pain or shortness of breath currently.  He states he had similar symptoms before Dr. Herbie Baltimore went back in and did his most recent intervention above.  He will continue with Plavix as directed.  2.  Hypertension: Has been on amlodipine 10 mg daily, and has been taking a half a tablet of valsartan 160 mg daily.  Will send new prescription for valsartan 80 mg daily as directed.  3.  Hypercholesterolemia: Goal of LDL is less than 70.  He is on Repatha as well as rosuvastatin 20 mg daily.  Checking fasting lipids and LFTs.     Informed Consent   Shared Decision Making/Informed Consent The risks [chest pain, shortness of breath, cardiac arrhythmias, dizziness, blood pressure fluctuations, myocardial infarction, stroke/transient ischemic attack, nausea, vomiting, allergic reaction, radiation exposure, metallic taste sensation and life-threatening complications (estimated to be 1 in 10,000)], benefits (risk stratification, diagnosing coronary artery disease, treatment guidance) and alternatives of a nuclear stress test were discussed in detail with Mr. Creasman and he agrees to proceed.      Signed, Bettey Mare. Liborio Nixon, ANP, AACC   12/18/2022 2:50 PM      Office [phone] Fax [phone]  Notice: This dictation was prepared with Dragon dictation along with smaller phrase  technology. Any transcriptional errors that result from this process are unintentional and may not be corrected upon review.

## 2022-12-18 ENCOUNTER — Encounter: Payer: Self-pay | Admitting: Adult Health

## 2022-12-18 ENCOUNTER — Ambulatory Visit: Payer: Medicare Other | Attending: Adult Health | Admitting: Adult Health

## 2022-12-18 VITALS — BP 120/80 | HR 66 | Ht 70.0 in | Wt 195.0 lb

## 2022-12-18 DIAGNOSIS — I25119 Atherosclerotic heart disease of native coronary artery with unspecified angina pectoris: Secondary | ICD-10-CM

## 2022-12-18 DIAGNOSIS — I10 Essential (primary) hypertension: Secondary | ICD-10-CM | POA: Diagnosis present

## 2022-12-18 DIAGNOSIS — I2510 Atherosclerotic heart disease of native coronary artery without angina pectoris: Secondary | ICD-10-CM | POA: Diagnosis present

## 2022-12-18 MED ORDER — VALSARTAN 80 MG PO TABS: 80.0000 mg | ORAL_TABLET | Freq: Every day | ORAL | 3 refills | Status: DC

## 2022-12-18 NOTE — Patient Instructions (Signed)
Medication Instructions:  No Changes *If you need a refill on your cardiac medications before your next appointment, please call your pharmacy*   Lab Work: CMET, Cbc, Lipid Panel. If you have labs (blood work) drawn today and your tests are completely normal, you will receive your results only by: MyChart Message (if you have MyChart) OR A paper copy in the mail If you have any lab test that is abnormal or we need to change your treatment, we will call you to review the results.   Testing/Procedures: [address]. [facility] October 2024). Your physician has requested that you have a lexiscan myoview. For further information please visit [email or web address] Please follow instruction sheet, as given.    Follow-Up: At [facility], you and your health needs are our priority.  As part of our continuing mission to provide you with exceptional heart care, we have created designated Provider Care Teams.  These Care Teams include your primary Cardiologist (physician) and Advanced Practice Providers (APPs -  Physician Assistants and Nurse Practitioners) who all work together to provide you with the care you need, when you need it.  We recommend signing up for the patient portal called "MyChart".  Sign up information is provided on this After Visit Summary.  MyChart is used to connect with patients for Virtual Visits (Telemedicine).  Patients are able to view lab/test results, encounter notes, upcoming appointments, etc.  Non-urgent messages can be sent to your provider as well.   To learn more about what you can do with MyChart, go to [email or web address].    Your next appointment:   6 week(s)  Provider:   Joni Reining, DNP, ANP    or, [name], MD .

## 2022-12-20 LAB — LIPID PANEL

## 2022-12-21 ENCOUNTER — Telehealth: Payer: Self-pay

## 2022-12-21 LAB — COMPREHENSIVE METABOLIC PANEL
ALT: 57 IU/L — ABNORMAL HIGH (ref 0–44)
AST: 56 IU/L — ABNORMAL HIGH (ref 0–40)
Albumin: 4.7 g/dL (ref 3.9–4.9)
Alkaline Phosphatase: 77 IU/L (ref 44–121)
BUN/Creatinine Ratio: 24 (ref 10–24)
BUN: 21 mg/dL (ref 8–27)
Bilirubin Total: 0.8 mg/dL (ref 0.0–1.2)
CO2: 24 mmol/L (ref 20–29)
Calcium: 9.7 mg/dL (ref 8.6–10.2)
Chloride: 101 mmol/L (ref 96–106)
Creatinine, Ser: 0.89 mg/dL (ref 0.76–1.27)
Globulin, Total: 2.1 g/dL (ref 1.5–4.5)
Glucose: 149 mg/dL — ABNORMAL HIGH (ref 70–99)
Potassium: 4.9 mmol/L (ref 3.5–5.2)
Sodium: 142 mmol/L (ref 134–144)
Total Protein: 6.8 g/dL (ref 6.0–8.5)
eGFR: 94 mL/min/{1.73_m2} (ref >59)

## 2022-12-21 LAB — CBC
Hematocrit: 45.4 % (ref 37.5–51.0)
Hemoglobin: 15.4 g/dL (ref 13.0–17.7)
MCH: 30.7 pg (ref 26.6–33.0)
MCHC: 33.9 g/dL (ref 31.5–35.7)
MCV: 90 fL (ref 79–97)
Platelets: 154 10*3/uL (ref 150–450)
RBC: 5.02 x10E6/uL (ref 4.14–5.80)
RDW: 12.9 % (ref 11.6–15.4)
WBC: 5 10*3/uL (ref 3.4–10.8)

## 2022-12-21 LAB — LIPID PANEL
Chol/HDL Ratio: 1.8 ratio (ref 0.0–5.0)
Cholesterol, Total: 62 mg/dL — ABNORMAL LOW (ref 100–199)
HDL: 35 mg/dL — ABNORMAL LOW (ref >39)
LDL Chol Calc (NIH): 7 mg/dL (ref 0–99)
Triglycerides: 103 mg/dL (ref 0–149)
VLDL Cholesterol Cal: 20 mg/dL (ref 5–40)

## 2022-12-21 NOTE — Telephone Encounter (Addendum)
Called patient regarding results. Left message for patient to call offie.----- Message from Joni Reining sent at 12/21/2022  7:27 AM EDT ----- I have reviewed his labs.  His liver enzymes are rising and therefore we will hold his rosuvastatin 20 mg daily for 1 week.  Total cholesterol is low along with his LDL.  Restart rosuvastatin at lower dose of 5 mg daily.  Repeat lipids and CMET in 6 weeks.

## 2022-12-31 ENCOUNTER — Telehealth: Payer: Self-pay | Admitting: *Deleted

## 2022-12-31 NOTE — Telephone Encounter (Signed)
Good Morning Dr. Lyman Bishop  We have received a surgical clearance request for colonoscopy/endoscopy for Mr. [name]. They were seen recently in clinic on 12/18/2022.  See per your note that he is scheduled to have a nuclear stress test to evaluate for decreased energy.  Can you please comment on surgical clearance and guidance on holding Plavix. Please forward you guidance and recommendations to P CV DIV PREOP.

## 2022-12-31 NOTE — Telephone Encounter (Signed)
   Pre-operative Risk Assessment    Patient Name: [name]  DOB: 1955-12-28 MRN: [id number]    DATE OF LAST VISIT: 12/18/22 Joni Reining, DNP DATE OF NEXT VISIT: 02/19/23 Joni Reining, DNP  Request for Surgical Clearance    Procedure:   COLONOSCOPY/ENDOSCOPY   Date of Surgery:  Clearance 01/16/23                                 Surgeon:  DR. Bosie Clos Surgeon's Group or Practice Name:  [facility] Phone number:  [phone] Fax number:  [phone]   Type of Clearance Requested:   - Medical ; PLAVIX   Type of Anesthesia:   PROPOFOL   Additional requests/questions:    Elpidio Anis   12/31/2022, 10:29 AM

## 2023-01-01 ENCOUNTER — Telehealth (HOSPITAL_COMMUNITY): Payer: Self-pay

## 2023-01-01 NOTE — Telephone Encounter (Signed)
Detailed instructions given. Asked to call back with any questions. S.Naziah Weckerly CCT

## 2023-01-01 NOTE — Telephone Encounter (Signed)
Jodelle Gross, NP  P Cv Div Preop Hi Alden Server,    I would hold off on clearing him for procedure until after stress test.  I would like him to stay on the Plavix due to multiple stents in the right coronary artery and new symptoms of fatigue.  If his stress test comes back low risk or nonischemic then he would be okay to hold Plavix for 5 days prior to the procedure.  Just want to be cautious before allowing Plavix to be held.  Thank you for reaching out.  Samara Deist

## 2023-01-08 ENCOUNTER — Ambulatory Visit (HOSPITAL_COMMUNITY): Payer: Medicare Other

## 2023-01-09 ENCOUNTER — Encounter (HOSPITAL_COMMUNITY): Payer: Medicare Other

## 2023-01-11 ENCOUNTER — Telehealth: Payer: Self-pay

## 2023-01-11 DIAGNOSIS — E785 Hyperlipidemia, unspecified: Secondary | ICD-10-CM

## 2023-01-11 MED ORDER — ROSUVASTATIN CALCIUM 5 MG PO TABS: 5.0000 mg | ORAL_TABLET | Freq: Every day | ORAL | 3 refills | Status: DC

## 2023-01-11 NOTE — Telephone Encounter (Addendum)
Called patient regarding results. Patient had understanding of results. Patient advised to decrease Crestor to 5 mg daily. Patient also advised to repeat lab work in 6 weeks. Lab order mailed to patient. New prescription sent to pharmacy ----- Message from Joni Reining sent at 12/21/2022  7:27 AM EDT ----- I have reviewed his labs.  His liver enzymes are rising and therefore we will hold his rosuvastatin 20 mg daily for 1 week.  Total cholesterol is low along with his LDL.  Restart rosuvastatin at lower dose of 5 mg daily.  Repeat lipids and CMET in 6 weeks.

## 2023-01-14 ENCOUNTER — Other Ambulatory Visit: Payer: Self-pay | Admitting: Cardiology

## 2023-01-15 ENCOUNTER — Encounter (HOSPITAL_COMMUNITY): Payer: Self-pay

## 2023-01-18 ENCOUNTER — Ambulatory Visit (HOSPITAL_COMMUNITY): Payer: Medicare Other | Attending: Adult Health

## 2023-01-18 VITALS — Ht 70.0 in | Wt 195.0 lb

## 2023-01-18 DIAGNOSIS — I10 Essential (primary) hypertension: Secondary | ICD-10-CM | POA: Diagnosis present

## 2023-01-18 DIAGNOSIS — E785 Hyperlipidemia, unspecified: Secondary | ICD-10-CM | POA: Diagnosis present

## 2023-01-18 DIAGNOSIS — I2119 ST elevation (STEMI) myocardial infarction involving other coronary artery of inferior wall: Secondary | ICD-10-CM | POA: Diagnosis present

## 2023-01-18 DIAGNOSIS — I2510 Atherosclerotic heart disease of native coronary artery without angina pectoris: Secondary | ICD-10-CM | POA: Insufficient documentation

## 2023-01-18 DIAGNOSIS — I2089 Other forms of angina pectoris: Secondary | ICD-10-CM | POA: Diagnosis present

## 2023-01-18 DIAGNOSIS — Z9861 Coronary angioplasty status: Secondary | ICD-10-CM | POA: Diagnosis present

## 2023-01-18 LAB — MYOCARDIAL PERFUSION IMAGING
Angina Index: 0
Duke Treadmill Score: 8
Estimated workload: 10.1
Exercise duration (min): 8 min
Exercise duration (sec): 1 s
LV dias vol: 102 mL (ref 62–150)
LV sys vol: 68 mL
MPHR: 153 {beats}/min
Nuc Stress EF: 38 %
Peak HR: 137 {beats}/min
Percent HR: 89 %
Rest HR: 68 {beats}/min
Rest Nuclear Isotope Dose: 11 mCi
SDS: 3
SRS: 0
SSS: 3
ST Depression (mm): 0 mm
Stress Nuclear Isotope Dose: 31.8 mCi
TID: 1

## 2023-01-18 MED ORDER — TECHNETIUM TC 99M TETROFOSMIN IV KIT
11.0000 | PACK | Freq: Once | INTRAVENOUS | Status: AC | PRN
  Administered 2023-01-18: 11 via INTRAVENOUS

## 2023-01-18 MED ORDER — TECHNETIUM TC 99M TETROFOSMIN IV KIT
31.8000 | PACK | Freq: Once | INTRAVENOUS | Status: AC | PRN
  Administered 2023-01-18: 31.8 via INTRAVENOUS

## 2023-01-21 ENCOUNTER — Telehealth: Payer: Self-pay

## 2023-01-21 NOTE — Telephone Encounter (Signed)
     Primary Cardiologist: [name], MD  Chart reviewed as part of pre-operative protocol coverage. Given past medical history and time since last visit, based on ACC/AHA guidelines, [name] would be at acceptable risk for the planned procedure without further cardiovascular testing.   If necessary his Plavix may be held for 5 to 7 days prior to his procedure.  Please resume as soon as hemostasis is achieved.  I will route this recommendation to the requesting party via Epic fax function and remove from pre-op pool.  Please call with questions.  Thomasene Ripple. Elinda Bunten NP-C     01/21/2023, 12:16 PM [facility] [address] Office [phone] Fax [phone]

## 2023-01-21 NOTE — Telephone Encounter (Addendum)
Called patient regarding results. Patient had understanding of results.----- Message from Joni Reining sent at 01/18/2023  5:13 PM EDT ----- I have reviewed the stress test results. No new areas of lack of blood flow.  There are areas of scarring from prior heart attacks only   KL

## 2023-02-15 ENCOUNTER — Telehealth: Payer: Self-pay

## 2023-02-15 LAB — COMPREHENSIVE METABOLIC PANEL
ALT: 44 [IU]/L (ref 0–44)
AST: 42 [IU]/L — ABNORMAL HIGH (ref 0–40)
Albumin: 4.7 g/dL (ref 3.9–4.9)
Alkaline Phosphatase: 81 [IU]/L (ref 44–121)
BUN/Creatinine Ratio: 17 (ref 10–24)
BUN: 16 mg/dL (ref 8–27)
Bilirubin Total: 0.9 mg/dL (ref 0.0–1.2)
CO2: 24 mmol/L (ref 20–29)
Calcium: 9.7 mg/dL (ref 8.6–10.2)
Chloride: 101 mmol/L (ref 96–106)
Creatinine, Ser: 0.96 mg/dL (ref 0.76–1.27)
Globulin, Total: 2.2 g/dL (ref 1.5–4.5)
Glucose: 152 mg/dL — ABNORMAL HIGH (ref 70–99)
Potassium: 4.5 mmol/L (ref 3.5–5.2)
Sodium: 140 mmol/L (ref 134–144)
Total Protein: 6.9 g/dL (ref 6.0–8.5)
eGFR: 87 mL/min/{1.73_m2} (ref >59)

## 2023-02-15 LAB — LIPID PANEL
Chol/HDL Ratio: 2.1 {ratio} (ref 0.0–5.0)
Cholesterol, Total: 71 mg/dL — ABNORMAL LOW (ref 100–199)
HDL: 34 mg/dL — ABNORMAL LOW (ref >39)
LDL Chol Calc (NIH): 9 mg/dL (ref 0–99)
Triglycerides: 171 mg/dL — ABNORMAL HIGH (ref 0–149)
VLDL Cholesterol Cal: 28 mg/dL (ref 5–40)

## 2023-02-15 NOTE — Telephone Encounter (Addendum)
Called patient regarding results. Patient had understanding of results.----- Message from Joni Reining sent at 02/15/2023  7:40 AM EST ----- I have reviewed the labs. Improvement in cholesterol but triglycerides are still elevated. Continue to follow up with pharmacy.

## 2023-02-17 NOTE — Progress Notes (Unsigned)
Cardiology Office Note:  .   Date:  02/19/2023  ID:  Mayra Neer, DOB [birth year], MRN [id number] PCP: Patient, No Pcp Per  [facility] Providers Cardiologist: [name], MD {   History of Present Illness: .   [name] is a 67 y.o. male  with history of CAD, inferior STEMI 2013 (DES in RCA - Promus DES 3.0 mm x 24 mm (post-dilated to 3.61m )  HTN, HL, on Repatha.  On last office visit the patient was complaining of worsening fatigue and chest pressure.    I repeated an ischemic evaluation with a nuclear medicine stress test.  This showed evidence of prior infarction revealing fixed inferior lateral defect.  LV function was abnormal at 38% this study was found to be an intermediate risk.  The patient will need to be continued on medical management.  He was cleared to have colonoscopy to preop clinic on 01/21/2023 and was able to hold Plavix for 5 days prior to the procedure.  He comes today with recurrent chest pain.  He states he feels it when he is working out in his yard and he is awakened this morning with chest pressure.  He has never used nitroglycerin to help with this.  He states his energy level is so-so and he is not as active as he likes to be.  He would like to have more energy.  He is medically compliant.  He denies significant dyspnea on exertion, dizziness, or profound fatigue.  His energy level has been low over the last several months.  He is not doing any purposeful exercise would like to get back into doing that.  ROS: As above otherwise negative  Studies Reviewed: .      Cardiac Cath 06/02/2019: Severe RCA disease with tandem 80 and 70% lesions, heavily calcified as likely culprit (prior to previous stent); Moderate (DFR negative) prox D1, mid LAD & mid LCx lesions with otherwise widely patent distal LAD stent; Normal LVEF and EDP.         Physical Exam:   VS:  BP 134/86 (BP Location: Left Arm, Patient Position: Sitting, Cuff Size: Normal)   Pulse 80    Ht 5\' 10"  (1.778 m)   Wt 197 lb 3.2 oz (89.4 kg)   SpO2 98%   BMI 28.30 kg/m    Wt Readings from Last 3 Encounters:  02/19/23 197 lb 3.2 oz (89.4 kg)  01/18/23 195 lb (88.5 kg)  12/18/22 195 lb (88.5 kg)    GEN: Well nourished, well developed in no acute distress NECK: No JVD; No carotid bruits CARDIAC: RRR, no murmurs, rubs, gallops RESPIRATORY:  Clear to auscultation without rales, wheezing or rhonchi  ABDOMEN: Soft, non-tender, non-distended EXTREMITIES:  No edema; No deformity   ASSESSMENT AND PLAN: .   Coronary artery disease: Multiple stents as above.  Having recurrent discomfort in his chest.  He has been diagnosed with stable angina the patient's manifestations are fatigue and shortness of breath which she has been experiencing chronically.   I did repeat a nuclear medicine stress test that was intermediate risk without any new areas of ischemia.  Patient had areas of scarring only from prior MI.  It was noted that he had reduced LVEF at 30%.  I am going to start him on isosorbide mononitrate 50 mg daily.  I have talked to him about side effect of headache.  If he does not tolerate this he is to let us know.  If he continues  to have persistent discomfort I will talk with Dr. Herbie Baltimore about possibility of repeating his catheterization.  For now continue medical management with statin therapy, blood pressure control, purposeful exercise, and lipid management.  2.  Hypercholesterolemia: Patient is on Repatha and rosuvastatin.  Recent lipid study total cholesterol 71, LDL 9, HDL 34.  It was noted that his triglycerides were elevated at 171.  This was fasting.  Due to multiple stents I will keep him on current medication regimen.  Can consider discontinuing rosuvastatin on follow-up.  3.  Hypertension.  The patient is on valsartan and amlodipine.  Blood pressure is well-controlled today.  No changes in his regimen.  I have reviewed his labs with him from his primary care creatinine  0.96.           Signed, Bettey Mare. Liborio Nixon, ANP, AACC

## 2023-02-19 ENCOUNTER — Encounter: Payer: Self-pay | Admitting: Adult Health

## 2023-02-19 ENCOUNTER — Ambulatory Visit: Payer: Medicare Other | Attending: Adult Health | Admitting: Adult Health

## 2023-02-19 VITALS — BP 134/86 | HR 80 | Ht 70.0 in | Wt 197.2 lb

## 2023-02-19 DIAGNOSIS — I2089 Other forms of angina pectoris: Secondary | ICD-10-CM | POA: Diagnosis present

## 2023-02-19 DIAGNOSIS — I2510 Atherosclerotic heart disease of native coronary artery without angina pectoris: Secondary | ICD-10-CM | POA: Insufficient documentation

## 2023-02-19 DIAGNOSIS — I10 Essential (primary) hypertension: Secondary | ICD-10-CM | POA: Insufficient documentation

## 2023-02-19 DIAGNOSIS — I25118 Atherosclerotic heart disease of native coronary artery with other forms of angina pectoris: Secondary | ICD-10-CM

## 2023-02-19 DIAGNOSIS — E7800 Pure hypercholesterolemia, unspecified: Secondary | ICD-10-CM | POA: Diagnosis not present

## 2023-02-19 MED ORDER — ISOSORBIDE MONONITRATE ER 30 MG PO TB24: 15.0000 mg | ORAL_TABLET | Freq: Every day | ORAL | 3 refills | Status: DC

## 2023-02-19 NOTE — Patient Instructions (Signed)
Medication Instructions:  Start Imdur 15 mg ( Take 1/2 of  a 30 mg Tablet Daily). *If you need a refill on your cardiac medications before your next appointment, please call your pharmacy*   Lab Work: No labs If you have labs (blood work) drawn today and your tests are completely normal, you will receive your results only by: MyChart Message (if you have MyChart) OR A paper copy in the mail If you have any lab test that is abnormal or we need to change your treatment, we will call you to review the results.   Testing/Procedures: No Testing   Follow-Up: At [facility], you and your health needs are our priority.  As part of our continuing mission to provide you with exceptional heart care, we have created designated Provider Care Teams.  These Care Teams include your primary Cardiologist (physician) and Advanced Practice Providers (APPs -  Physician Assistants and Nurse Practitioners) who all work together to provide you with the care you need, when you need it.  We recommend signing up for the patient portal called "MyChart".  Sign up information is provided on this After Visit Summary.  MyChart is used to connect with patients for Virtual Visits (Telemedicine).  Patients are able to view lab/test results, encounter notes, upcoming appointments, etc.  Non-urgent messages can be sent to your provider as well.   To learn more about what you can do with MyChart, go to [email or web address].    Your next appointment:   First Available  Provider:   [name], MD

## 2023-02-25 NOTE — Telephone Encounter (Signed)
Yes, Imdur tends to cause constipation

## 2023-02-25 NOTE — Telephone Encounter (Signed)
Patient identification verified by 2 forms. Marilynn Rail, RN    Called and spoke to patient  Patient states:   -has issues with constipation, had colonoscopy  -noticed constipation has worsened since starting imdur  -Looked online and saw Imdur can cause constipation   -had a BM today after taking multiple dulcolax  -typically takes dulcolax and miralax  -unsure if Imdur is worsening constipation but did notice change once started  Informed patient message sent to pharmacy for assistance  Patient has no further questions at this time

## 2023-03-01 ENCOUNTER — Other Ambulatory Visit: Payer: Self-pay | Admitting: Cardiology

## 2023-03-04 ENCOUNTER — Other Ambulatory Visit: Payer: Self-pay | Admitting: Cardiology

## 2023-03-14 ENCOUNTER — Other Ambulatory Visit (HOSPITAL_COMMUNITY): Payer: Self-pay

## 2023-03-14 ENCOUNTER — Telehealth: Payer: Self-pay | Admitting: Pharmacy Technician

## 2023-03-14 NOTE — Telephone Encounter (Signed)
Pharmacy Patient Advocate Encounter   Received notification from Patient Advice Request messages that prior authorization for repatha is required/requested.   Insurance verification completed.   The patient is insured through [facility] .   Per test claim: PA required; PA submitted to above mentioned insurance via CoverMyMeds Key/confirmation #/EOC [id number] Status is pending

## 2023-03-14 NOTE — Telephone Encounter (Signed)
Pharmacy Patient Advocate Encounter  Received notification from [facility] that Prior Authorization for repatha has been APPROVED from 03/14/23 to 03/13/24   PA #/Case ID/Reference #: [id number]

## 2023-04-19 ENCOUNTER — Ambulatory Visit: Payer: Medicare Other | Attending: Cardiology | Admitting: Cardiology

## 2023-04-19 ENCOUNTER — Encounter: Payer: Self-pay | Admitting: Cardiology

## 2023-04-19 VITALS — BP 120/70 | HR 63 | Ht 70.0 in | Wt 197.0 lb

## 2023-04-19 DIAGNOSIS — E785 Hyperlipidemia, unspecified: Secondary | ICD-10-CM | POA: Insufficient documentation

## 2023-04-19 DIAGNOSIS — G720 Drug-induced myopathy: Secondary | ICD-10-CM | POA: Insufficient documentation

## 2023-04-19 DIAGNOSIS — I2510 Atherosclerotic heart disease of native coronary artery without angina pectoris: Secondary | ICD-10-CM | POA: Diagnosis present

## 2023-04-19 DIAGNOSIS — R0609 Other forms of dyspnea: Secondary | ICD-10-CM | POA: Diagnosis present

## 2023-04-19 DIAGNOSIS — T466X5A Adverse effect of antihyperlipidemic and antiarteriosclerotic drugs, initial encounter: Secondary | ICD-10-CM | POA: Diagnosis present

## 2023-04-19 DIAGNOSIS — I25119 Atherosclerotic heart disease of native coronary artery with unspecified angina pectoris: Secondary | ICD-10-CM | POA: Diagnosis present

## 2023-04-19 DIAGNOSIS — Z9861 Coronary angioplasty status: Secondary | ICD-10-CM | POA: Diagnosis present

## 2023-04-19 DIAGNOSIS — I10 Essential (primary) hypertension: Secondary | ICD-10-CM | POA: Insufficient documentation

## 2023-04-19 MED ORDER — NITROGLYCERIN 0.4 MG SL SUBL: 0.4000 mg | SUBLINGUAL_TABLET | SUBLINGUAL | 4 refills | Status: AC | PRN

## 2023-04-19 NOTE — Progress Notes (Unsigned)
Cardiology Office Note:  .   Date:  04/22/2023  ID:  KAVEION NATALE, DOB 14-May-1955, MRN [id number] PCP: Patient, No Pcp Per  [facility] Providers Cardiologist:  [name], MD     Chief Complaint  Patient presents with   Follow-up    11-month follow-up.  Still having intermittent chest discomfort and shortness of breath on exertion but better.   Coronary Artery Disease    Most recent Myoview nonischemic despite having chest discomfort    Patient Profile: .     Torsten Hefter Paolini is a  68 y.o. male with a PMH reviewed below 52-month follow-up who presents here for 2 month at the request of No ref. provider found.  CAD History:  Inferior STEMI MI -> PCI to the RCA in 2013.   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 Also had D1 and circumflex disease evaluated with FFR --> nonsignificant. Medical therapy. Crescendo Angina March 2021: (Myoview in February 2020 showed inferior ischemia -> Cath delayed due to COVID-19 restrictions) ->  (06/02/2019), CATH: moderate lesions in the LCA => all negative by DFR, extensive mid-distal RCA disease, calcified prior to previous stent-- (06/05/2019), STAGED CSI DES PCI 2 additional proximal overlapping stents that overlap the distal/original stent. ->  Reviewed below (01/2023) Myoview: No Ischemia, + Prior Infarct (medium size mild intensity fixed defect in the mid to basal inferior and apical lateral wall with abnormal wall motion consistent with infarction).  Excellent exercise tolerance-10.1 METS (8 minutes).  EF estimated at 38%.  INTERMEDIATE RISK due to reduced EF and infarct.   I last saw Pilot Engles Ingham back in August 2023 at which time he was doing fairly well.  He had not refilled his amlodipine dose and his blood pressure was going up a little bit.  He was noting a off-and-on thumping sensation in his chest lasting a few seconds but nothing prolonged.  Not very  active.  Still not exercising that much.  Keeps talking about 1 day ago walking with his wife but does not.  Occasional vertigo.  No CHF symptoms.  He was last seen Joni Reining, NP on February 19, 2023 for follow-up evaluation of chest pain/pressure and fatigue.  He was evaluated with Myoview which was nonischemic with history of prior infarct.  As result of this he is cleared to have colonoscopy done.  Still having recurrent chest discomfort while working out in the yard.  Had not yet tried nitroglycerin.  Energy level is "so-so "would like to have more energy, but not really exercising.  She started Imdur 15 mg and discussed potential relook catheterization if symptoms persisted.  The  Subjective  Discussed the use of AI scribe software for clinical note transcription with the patient, who gave verbal consent to proceed.  History of Present Illness   The patient, a 12-year post myocardial infarction survivor with MV CAD & multiple stents, presents for a routine follow-up.    He has been on Repatha and rosuvastatin for cholesterol management, amlodipine and valsartan for blood pressure control, and Plavix due to the presence of multiple stents. The patient reported no chest pain but described occasional feelings of tightness in the chest, not associated with any specific activity. The patient also reported experiencing shortness of breath, particularly during physical exertion, necessitating frequent rest periods. The patient acknowledged a lack of regular exercise and expressed a desire to improve physical fitness.  The patient reported  occasional episodes of heart pounding, which were not associated with lightheadedness, dizziness, or shortness of breath. There were no reports of leg swelling, shortness of breath when lying flat, or waking up due to shortness of breath. The patient denied any stroke-like symptoms or blood in stools.  The patient's last stress test, conducted approximately two  years ago, was reassuring, with no evidence of ischemia. However, there was a medium-sized mild defect in the inferior lateral wall, consistent with the patient's previous heart attack. The patient's ejection fraction was estimated to be around 45%, slightly lower than the normal range, suggesting a possible decrease in heart relaxation and filling capacity.  The patient expressed a desire to increase physical activity, including walking and weight training, and was encouraged to carry nitroglycerin as a precautionary measure. The patient's current medications were deemed appropriate and were continued.     Cardiovascular ROS: positive for - chest pain, dyspnea on exertion, irregular heartbeat, palpitations, and as described in HPI negative for - edema, orthopnea, paroxysmal nocturnal dyspnea, rapid heart rate, shortness of breath, or lightheadedness or dizziness, syncope or near syncope, TIA or amaurosis fugax.  ROS:  Review of Systems - Negative except symptoms noted in HPI.  Mild aches and pains    Objective   Medications - Repatha 140 mg/ML injection Q 14-day - Rosuvastatin 5 mg daily (Max Tolerated Dose) - Amlodipine 10 mg daily - Valsartan 80 mg  - Plavix 75 mg (SAPT) maintenance - - Isosorbide Mononitrate 15 mg  -  PRN SL Nitroglycerin  - Prilosec 40 mg daily (??) or Aciphex 20 mg dialy  Studies Reviewed: Marland Kitchen   EKG Interpretation Date/Time:  Friday April 19 2023 13:21:44 EST Ventricular Rate:  63 PR Interval:  246 QRS Duration:  104 QT Interval:  414 QTC Calculation: 423 R Axis:   -15  Text Interpretation: Sinus rhythm with 1st degree A-V block Possible Left atrial enlargement Left ventricular hypertrophy ( R in aVL , Cornell product ) Inferior infarct (cited on or before 02-Jun-2019) When compared with ECG of 18-Dec-2022 14:34, No significant change was found Confirmed by [name] ([id number]) on 04/19/2023 1:37:46 PM   Myoview Oct 2024:    LV perfusion is abnormal. There  is no evidence of ischemia. There is evidence of infarction. Defect 1: There is a medium defect with mild reduction in uptake present in the mid to basal inferior and inferolateral location(s) that is fixed. There is abnormal wall motion in the defect area. Consistent with infarction.   Left ventricular function is abnormal. Nuclear stress EF: 38%. The left ventricular ejection fraction is moderately decreased (30-44%). End diastolic cavity size is normal.   Average exercise capacity (8:01 min:s; 10.1 METS). Normal HR/BP response to exercise. PVCs noted with exercise and in recovery.   Findings are consistent with infarction. The study is Intermediate Risk.  Cardiac Cath 06/02/2019: Severe RCA disease with tandem 80 and 70% lesions, heavily calcified as likely culprit (prior to previous stent); Moderate (DFR negative) prox D1, mid LAD & mid LCx lesions with otherwise widely patent distal LAD stent; Normal LVEF and EDP. 06/05/2019 STAGED ATHERECTOMY PCI RCA: Resolute Onyx 3.0 mm 8 mm overlapping most distal stent followed by 3.5 mm x 38 mm proximal covering up to the 60% lesion. -Difficult to overlap the distal stent because of unappreciated lesion in the crux of the RCA         LABS (02/14/2023): Total cholesterol: 71 mg/dL; Triglycerides: 171 mg/dL; HDL: 34 mg/dL; LDL: 9 mg/dL X3K:  5.9%  Risk Assessment/Calculations:        Physical Exam:   VS:  BP 120/70   Pulse 63   Ht 5\' 10"  (1.778 m)   Wt 197 lb (89.4 kg)   SpO2 96%   BMI 28.27 kg/m    Wt Readings from Last 3 Encounters:  04/19/23 197 lb (89.4 kg)  02/19/23 197 lb 3.2 oz (89.4 kg)  01/18/23 195 lb (88.5 kg)    GEN: Healthy appearing, well-groomed.  NAD. NECK: No JVD; No carotid bruits CARDIAC: Normal S1, S2; RRR, no murmurs, rubs, gallops RESPIRATORY:  Clear to auscultation without rales, wheezing or rhonchi ; nonlabored, good air movement. ABDOMEN: Soft, non-tender, non-distended EXTREMITIES:  No edema; No deformity      ASSESSMENT AND PLAN: .    Problem List Items Addressed This Visit       Cardiology Problems   CAD S/P PCI to RCA (2013, 2021) and LAD (2015); therapeutic P2Y12 for Plavix (Chronic)   Almost 4 years out from PCI resulting in 3 overlapping stents in the RCA covering the majority of his RCA. Plan was lifelong Thienopyridine monotherapy (SAPT) with clopidogrel/Plavix 75 mg daily. Okay to hold Plavix/clopidogrel 5 to 7 days preop for surgeries or procedures. Okay to hold for bleeding 3 to 5 days until bleeding is stabilized.      Relevant Medications   nitroGLYCERIN (NITROSTAT) 0.4 MG SL tablet   Coronary artery disease involving native coronary artery of native heart with angina pectoris (HCC) - Primary (Chronic)   History of myocardial infarction 12 years ago with multiple stents placed, most recent 4 years ago. Currently on Repatha, atorvastatin, amlodipine, valsartan, and Plavix.  Reports occasional chest tightness but no consistent angina. => Stress test in October showed no ischemia but confirmed prior Infarct.  -Continue current medications: Amlodipine 10 mg daily-antiangina, along with valsartan 80 mg for afterload reduction -Continue combination of low-dose Crestor plus Repatha for lipids. -Use nitroglycerin as needed for chest tightness -he did not tolerate Imdur (in the past unable to afford Repatha) -Consider repeat echocardiogram if shortness of breath worsens. -Low threshold to proceed with re-look Cardiac Cath if BP worsens.       Relevant Medications   nitroGLYCERIN (NITROSTAT) 0.4 MG SL tablet   Essential hypertension (Chronic)   Blood pressure well-controlled on current doses of valsartan 80 mg daily and amlodipine 10 mg daily. -Continue current meds      Relevant Medications   nitroGLYCERIN (NITROSTAT) 0.4 MG SL tablet   Other Relevant Orders   EKG 12-Lead (Completed)   Hyperlipidemia with target low density lipoprotein (LDL) cholesterol less than 55 mg/dL  (Chronic)   On max tolerated dose of rosuvastatin and Repatha. Last cholesterol checked in November showed total cholesterol of 71, triglycerides 171, HDL 34, LDL 9. -Continue current medications. -Check cholesterol again in 6 months.      Relevant Medications   nitroGLYCERIN (NITROSTAT) 0.4 MG SL tablet     Other   Dyspnea on exertion   Reports becoming easily out of breath with exertion, requiring rest. Likely due to combination of deconditioning and reduced cardiac function. -Encouraged to gradually increase physical activity, starting with walking and then adding weight training. -Use nitroglycerin as needed if dyspnea is associated with chest tightness. -If nitroglycerin use increases, may have a low threshold to consider echocardiogram and possible catheterization.      Statin myopathy (Chronic)   He has tried some over statin and simvastatin.  We have also tried him on  rosuvastatin, but only able to tolerate 5 mg daily.  Not at goal with max tolerated dose of statin. Started on Repatha with excellent control -Continue Repatha plus low-dose rosuvastatin        Follow-Up: Return in about 6 months (around 10/17/2023).  Total time spent: 34 min spent with patient + 21 min spent charting = 55 min I spent 51 minutes in the care of Romolo L Gambill today including reviewing outside labs from [facility] ( ), reviewing studies (5 min - images personally reviewed & with patient), face to face time discussing treatment options (34), reviewing records from recent notes from [facility] (5 min), 9 additional min dictating, and documenting in the encounter.     Signed, Marykay Lex, MD, MS [name], M.D., M.S. Interventional Cardiologist  [facility]  Pager # [phone] Phone # [phone] [address] [place], [place] [address]

## 2023-04-19 NOTE — Patient Instructions (Addendum)
Medication Instructions:    If you chest discomfort you may use  sublingual Nitroglycerin tablets   You can also take this medication 5 to 10 minutes before an event likely to produce chest pain. Follow the directions exactly as written on the prescription label. Place one tablet under your tongue and let it dissolve. Do not swallow whole. Replace the dose if you accidentally swallow it. It will help if your mouth is not dry. Saliva around the tablet will help it to dissolve more quickly. Do not eat or drink, smoke or chew tobacco while a tablet is dissolving. Sit down when taking this medication. In an angina attack, you should feel better within 5 minutes after your first dose. You can take a dose every 5 minutes up to a total of 3 doses. If you do not feel better or feel worse after 1 dose, call 9-1-1 at once. Do not take more than 3 doses in 15 minutes. Your care team might give you other directions. Follow those directions if they do. Do not take your medication more often than directed. *If you need a refill on your cardiac medications before your next appointment, please call your pharmacy*   Lab Work: Not needed    Testing/Procedures:  Not needed  Follow-Up: At [facility], you and your health needs are our priority.  As part of our continuing mission to provide you with exceptional heart care, we have created designated Provider Care Teams.  These Care Teams include your primary Cardiologist (physician) and Advanced Practice Providers (APPs -  Physician Assistants and Nurse Practitioners) who all work together to provide you with the care you need, when you need it.     Your next appointment:   6 month(s)  The format for your next appointment:   In Person  Provider:   [name], MD    .

## 2023-04-22 ENCOUNTER — Encounter: Payer: Self-pay | Admitting: Cardiology

## 2023-04-22 DIAGNOSIS — G720 Drug-induced myopathy: Secondary | ICD-10-CM | POA: Insufficient documentation

## 2023-04-22 NOTE — Assessment & Plan Note (Signed)
On max tolerated dose of rosuvastatin and Repatha. Last cholesterol checked in November showed total cholesterol of 71, triglycerides 171, HDL 34, LDL 9. -Continue current medications. -Check cholesterol again in 6 months.

## 2023-04-22 NOTE — Assessment & Plan Note (Signed)
He has tried some over statin and simvastatin.  We have also tried him on rosuvastatin, but only able to tolerate 5 mg daily.  Not at goal with max tolerated dose of statin. Started on Repatha with excellent control -Continue Repatha plus low-dose rosuvastatin

## 2023-04-22 NOTE — Assessment & Plan Note (Signed)
Blood pressure well-controlled on current doses of valsartan 80 mg daily and amlodipine 10 mg daily. -Continue current meds

## 2023-04-22 NOTE — Assessment & Plan Note (Addendum)
History of myocardial infarction 12 years ago with multiple stents placed, most recent 4 years ago. Currently on Repatha, atorvastatin, amlodipine, valsartan, and Plavix.  Reports occasional chest tightness but no consistent angina. => Stress test in October showed no ischemia but confirmed prior Infarct.  -Continue current medications: Amlodipine 10 mg daily-antiangina, along with valsartan 80 mg for afterload reduction -Continue combination of low-dose Crestor plus Repatha for lipids. -Use nitroglycerin as needed for chest tightness -he did not tolerate Imdur (in the past unable to afford Repatha) -Consider repeat echocardiogram if shortness of breath worsens. -Low threshold to proceed with re-look Cardiac Cath if BP worsens.

## 2023-04-22 NOTE — Assessment & Plan Note (Signed)
Reports becoming easily out of breath with exertion, requiring rest. Likely due to combination of deconditioning and reduced cardiac function. -Encouraged to gradually increase physical activity, starting with walking and then adding weight training. -Use nitroglycerin as needed if dyspnea is associated with chest tightness. -If nitroglycerin use increases, may have a low threshold to consider echocardiogram and possible catheterization.

## 2023-04-22 NOTE — Assessment & Plan Note (Signed)
Almost 4 years out from PCI resulting in 3 overlapping stents in the RCA covering the majority of his RCA. Plan was lifelong Thienopyridine monotherapy (SAPT) with clopidogrel/Plavix 75 mg daily. Okay to hold Plavix/clopidogrel 5 to 7 days preop for surgeries or procedures. Okay to hold for bleeding 3 to 5 days until bleeding is stabilized.

## 2023-05-10 ENCOUNTER — Telehealth: Payer: Self-pay | Admitting: *Deleted

## 2023-05-10 ENCOUNTER — Encounter: Payer: Self-pay | Admitting: Cardiology

## 2023-05-10 NOTE — Telephone Encounter (Signed)
 Office called back to provide fax #: [phone]. Please advise

## 2023-05-10 NOTE — Telephone Encounter (Signed)
     Primary Cardiologist: Alm Clay, MD  Chart reviewed as part of pre-operative protocol coverage. Given past medical history and time since last visit, based on ACC/AHA guidelines, Birch L Barbary would be at acceptable risk for the planned procedure without further cardiovascular testing.   His Plavix  may be held for 5 days prior to his procedure.  Please resume as soon as hemostasis is achieved  I will route this recommendation to the requesting party via Epic fax function and remove from pre-op pool.  Please call with questions.  [name]. Darianny Momon NP-C     05/10/2023, 3:11 PM [facility] [address] Office [phone] Fax [phone]

## 2023-05-10 NOTE — Telephone Encounter (Signed)
   Pre-operative Risk Assessment    Patient Name: [name]  DOB: May 12, 1955 MRN: [id number]   Date of last office visit: 04/19/2023 Date of next office visit: N/A   Request for Surgical Clearance    Procedure:   EGD  Date of Surgery:  Clearance 05/23/23                                Surgeon:   Surgeon's Group or Practice Name:  [facility] Phone number:  [phone] AMIE, RN Fax number:  LEFT A MESSAGE FOR AMY TO CALL BACK WITH [facility]   Type of Clearance Requested:   - Medical  - Pharmacy:  Hold Clopidogrel  (Plavix ) X'S 5 DAYS   Type of Anesthesia:  Not Indicated   Additional requests/questions:    Signed, Memory Nest   05/10/2023, 1:34 PM

## 2023-05-16 ENCOUNTER — Encounter: Payer: Self-pay | Admitting: Cardiology

## 2023-07-30 ENCOUNTER — Ambulatory Visit: Payer: Medicare Other | Admitting: Psychiatry

## 2023-08-24 ENCOUNTER — Other Ambulatory Visit: Payer: Self-pay | Admitting: Psychiatry

## 2023-08-24 DIAGNOSIS — F411 Generalized anxiety disorder: Secondary | ICD-10-CM

## 2023-08-26 ENCOUNTER — Other Ambulatory Visit: Payer: Self-pay | Admitting: Allergy and Immunology

## 2023-09-02 ENCOUNTER — Encounter: Payer: Self-pay | Admitting: Cardiology

## 2023-09-30 ENCOUNTER — Encounter: Payer: Self-pay | Admitting: Psychiatry

## 2023-09-30 ENCOUNTER — Ambulatory Visit (INDEPENDENT_AMBULATORY_CARE_PROVIDER_SITE_OTHER): Admitting: Psychiatry

## 2023-09-30 DIAGNOSIS — F5105 Insomnia due to other mental disorder: Secondary | ICD-10-CM

## 2023-09-30 DIAGNOSIS — F411 Generalized anxiety disorder: Secondary | ICD-10-CM | POA: Diagnosis not present

## 2023-09-30 MED ORDER — FLUOXETINE HCL 20 MG PO CAPS: 20.0000 mg | ORAL_CAPSULE | Freq: Every day | ORAL | 3 refills | Status: AC

## 2023-09-30 NOTE — Progress Notes (Signed)
 [name] [id number] 1956-03-28 68 y.o.  Subjective:   Patient ID:  [name] is a 68 y.o. (DOB 07-12-1955) male.  Chief Complaint:  Chief Complaint  Patient presents with   Follow-up    Mood reactivity and sleep and meds    HPI [name] presents to the office today for follow-up of generalized anxiety disorder with prominent irritability as a primary symptom. First visit 01/24/21 appt started sertraline  50 mg daily.  04/04/21 appt noted: She thinks it's helping some but not enough.  He thinks some is better. Still has irritability.  Always been kind of a nervous anxious person as was mother. SE constant GI gurgling very annoying.   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  That is a welcome benefit. Plan: Switch back to paroxetine  at 20 mg daily DT GI SE sertraline  50 over 7 days.  06/05/2021 appointment with the following noted: Ok but knocks out sex drive and a little constipation.  Also gets a little shakey Anxiety and irritability are a little better but not gone.  Occ snappy.   Chronic GI sx are only a little better with paroxetine  vs sertraline . Plan: Switch to fluoxetine  20 mg daily DT sexual SE paroxetine  (Start fluoxetine  1 daily and reduce paroxetine  to 1/2 daily for 1 week then stop paroxetine )  08/29/2021 appointment with the following noted: Doing good. Took awhile to help anger but less sexual Se than paroxetine . Anxiety and irritability manageable and better with meds. No sign SE Paxil  helped neuropathy better.. Wants refill Lunesta . Sleep pretty good with Lunesta .   8 hours sleep without SE  Mind won't turn off without some sleeper and feels bad next day. Asked questions about Quiviviq.  01/30/22 appt noted: Overall been alright with occ anger flares.  Wife's slowness gets on his nerves. No problems with irritability or anger in public. Always had anxiety since a kid but retired it's better. Not depressed. [place]  Likes to  [employer]. Remains on fluoxetine  20 mg daily. Sleep good with Lunesta  but without it has racing thoughts. No manic sx.  07/31/22 appt: Weaned off Lunesta  bc wanted to be without it.  Doing ok .  Benadryl prn is ok.  6-7 hours of sleep. On fluoxetine  20. As only psych med.  No SE Mood is good.  Used to get anger outbursts and not hardly at all now. Retired 7 year.  Fishing and golf and yard work.  Some walking.  09/30/23 med:  Med>  fluoxetine  20 Enjoying retirement.  Doing fine overall with mood.   Having some px dropping things wonders if it is TD.  A little px with balance and had it for a good while.  Mushroom supplement from [place] seems to help mood and neuropathy.   Less anxious since retired.    Past Psychiatric History: Dr. Geoffry from 2007-2017 Past Psychiatric Medication Trials: Zolpidem  poor response, Ambien  CR hangover Lunesta  2 effective Clonazepam  0.5 mg Paroxetine  10, citalopram 80, sertraline  100, Effexor 37.5,  Wellbutrin 150, Depakote fatigue Adderall, Ritalin Geodon panic, Seroquel hangover Lithium SE  Review of Systems:  Review of Systems  Genitourinary:        Sexual SE  Musculoskeletal:  Positive for arthralgias and back pain.  Neurological:  Negative for tremors and weakness.       Neuropathy not much lately  Psychiatric/Behavioral:  Negative for sleep disturbance.     Medications: I have reviewed the patient's current medications.  Current Outpatient Medications  Medication Sig Dispense Refill   amLODipine  (NORVASC ) 10 MG tablet Take 1 tablet (10 mg total) by mouth daily. 90 tablet 3   clopidogrel  (PLAVIX ) 75 MG tablet take ONE tablet by MOUTH daily 30 tablet 9   EPINEPHrine  0.3 mg/0.3 mL IJ SOAJ injection Use as directed for life-threatening allergic reaction. 2 each 3   Evolocumab  (REPATHA  SURECLICK) 140 MG/ML SOAJ Inject 140 mg into the skin every 14 (fourteen) days. 6 mL 3   famotidine  (PEPCID ) 40 MG tablet Take one tablet by mouth at bedtime.  30 tablet 5   finasteride  (PROSCAR ) 5 MG tablet Take 5 mg by mouth daily.     fluticasone  (FLONASE ) 50 MCG/ACT nasal spray Place 2 sprays into both nostrils daily.     nitroGLYCERIN  (NITROSTAT ) 0.4 MG SL tablet Place 1 tablet (0.4 mg total) under the tongue every 5 (five) minutes as needed for chest pain. 25 tablet 4   omeprazole (PRILOSEC) 40 MG capsule Take 40 mg by mouth daily.     RABEprazole  (ACIPHEX ) 20 MG tablet Take 20 mg by mouth every morning.     valsartan  (DIOVAN ) 80 MG tablet Take 1 tablet (80 mg total) by mouth daily. 90 tablet 3   FLUoxetine  (PROZAC ) 20 MG capsule Take 1 capsule (20 mg total) by mouth daily. 90 capsule 3   rosuvastatin  (CRESTOR ) 5 MG tablet Take 1 tablet (5 mg total) by mouth daily. 90 tablet 3   No current facility-administered medications for this visit.    Medication Side Effects: GI and some sexual reduced interest  Allergies: No Known Allergies  Past Medical History:  Diagnosis Date   Anxiety    Arthritis    mostly in my hands; sometimes other areas (10/20/2013)   Bipolar disorder (HCC)    BPH (benign prostatic hyperplasia)    CAD S/P percutaneous coronary angioplasty 3/'13, 7/'15; 3/'21   (Therapeutic P2Y12 on Plavix ); a) STEMI -m RCA DES PCI; 2015: Class III angina (abnl MYOVIEW  w/ Ant-AntLat ischemia) -> m-d LAD 95-99% (PCI: Biotronik AG 2 DES 2.5 x 13, 2.75 mm); b. 3/'21 Cath/Staged PCI: LM nl, mLAD 60% (DFR .96), D1 65%  (DFR .95), mCx 60% (DFR .96); pRCA 60% - m-d 85&70% (CSI -> 3.5x38 Resolute DES p-m, 3.0x8 Resolute DES m-d connecting new & old  STENTS). EF 55-65%.   Depression    Dizziness of unknown cause January 2011   abn MRI, Neuro w/u Jan 2011   Dyslipidemia, goal LDL below 70    Essential hypertension    GERD (gastroesophageal reflux disease)    RA (rheumatoid arthritis) (HCC)    Rheumatic fever 1969   ST elevation myocardial infarction (STEMI) involving right coronary artery in recovery phase (HCC) 06/2011   100% occluded RCA;  PCI - 3.0x24 Promus DES (3.5-3.6 mm) ; Mod w moderate LAD & Cx disease, EF 55-60% w/ basal inferoseptal HK confirmed by echo    Past Medical History, Surgical history, Social history, and Family history were reviewed and updated as appropriate.   Please see review of systems for further details on the patient's review from today.   Objective:   Physical Exam:  There were no vitals taken for this visit.  Physical Exam Constitutional:      General: He is not in acute distress.  Musculoskeletal:        General: No deformity.   Neurological:     Mental Status: He is alert and oriented to person, place, and time.     Coordination: Coordination normal.  Psychiatric:        Attention and Perception: Attention and perception normal. He does not perceive auditory or visual hallucinations.        Mood and Affect: Mood is anxious. Mood is not depressed. Affect is not labile, blunt, angry, tearful or inappropriate.        Speech: Speech normal.        Behavior: Behavior normal. Behavior is not slowed.        Thought Content: Thought content normal. Thought content is not paranoid or delusional. Thought content does not include homicidal or suicidal ideation. Thought content does not include suicidal plan.        Cognition and Memory: Cognition and memory normal.        Judgment: Judgment normal.     Comments: Insight intact irritability and anxiety improved      Lab Review:     Component Value Date/Time   NA 140 02/14/2023 0954   K 4.5 02/14/2023 0954   CL 101 02/14/2023 0954   CO2 24 02/14/2023 0954   GLUCOSE 152 (H) 02/14/2023 0954   GLUCOSE 81 06/06/2019 0341   BUN 16 02/14/2023 0954   CREATININE 0.96 02/14/2023 0954   CREATININE 0.86 05/09/2016 1039   CALCIUM  9.7 02/14/2023 0954   PROT 6.9 02/14/2023 0954   ALBUMIN 4.7 02/14/2023 0954   AST 42 (H) 02/14/2023 0954   ALT 44 02/14/2023 0954   ALKPHOS 81 02/14/2023 0954   BILITOT 0.9 02/14/2023 0954   GFRNONAA >60  06/06/2019 0341   GFRNONAA >89 10/16/2013 1455   GFRAA >60 06/06/2019 0341   GFRAA >89 10/16/2013 1455       Component Value Date/Time   WBC 5.0 12/20/2022 0901   WBC 6.1 06/06/2019 0341   RBC 5.02 12/20/2022 0901   RBC 4.35 06/06/2019 0341   HGB 15.4 12/20/2022 0901   HCT 45.4 12/20/2022 0901   PLT 154 12/20/2022 0901   MCV 90 12/20/2022 0901   MCH 30.7 12/20/2022 0901   MCH 30.6 06/06/2019 0341   MCHC 33.9 12/20/2022 0901   MCHC 35.0 06/06/2019 0341   RDW 12.9 12/20/2022 0901    No results found for: POCLITH, LITHIUM   No results found for: PHENYTOIN, PHENOBARB, VALPROATE, CBMZ   .res Assessment: Plan:    Julus was seen today for follow-up.  Diagnoses and all orders for this visit:  Generalized anxiety disorder -     FLUoxetine  (PROZAC ) 20 MG capsule; Take 1 capsule (20 mg total) by mouth daily.  Insomnia due to mental condition  Wife saw some degree of improvement with sertraline  50 mg in terms of his irritability.  He does not notice much change.  However he complains of side effects from the sertraline  50 mg with GI problems.  Sexual SE paroxetine  Switched to fluoxetine  20 mg daily DT sexual SE paroxetine  was successful.  Good sx control and SE minimal.  Rec disc px dropping things and chronic balance issues with PCP.  Not related to psych meds.  Disc poss causes like low B12.  There was hx of question about Fahr's dx but he doesn't think it was ever confirmed.  Neuropathy is better than it was for no reason.    Done ok off Lunesta  .  Rare SE he didn't want to risk.  Ok trial Benadryl prn  Fu 12 mos  Lorene Macintosh, MD, DFAPA    Please see After Visit Summary for patient specific instructions.  Future Appointments  Date Time Provider Department  Center  10/24/2023 10:00 AM Kozlow, Camellia PARAS, MD [facility] None     No orders of the defined types were placed in this encounter.   -------------------------------

## 2023-10-21 ENCOUNTER — Encounter: Payer: Self-pay | Admitting: Cardiology

## 2023-10-24 ENCOUNTER — Encounter: Payer: Self-pay | Admitting: Allergy and Immunology

## 2023-10-24 ENCOUNTER — Ambulatory Visit (INDEPENDENT_AMBULATORY_CARE_PROVIDER_SITE_OTHER): Payer: Medicare Other | Admitting: Allergy and Immunology

## 2023-10-24 ENCOUNTER — Encounter: Payer: Self-pay | Admitting: Cardiology

## 2023-10-24 VITALS — BP 142/88 | HR 72 | Resp 14 | Ht 69.5 in | Wt 195.0 lb

## 2023-10-24 DIAGNOSIS — K219 Gastro-esophageal reflux disease without esophagitis: Secondary | ICD-10-CM

## 2023-10-24 DIAGNOSIS — J310 Chronic rhinitis: Secondary | ICD-10-CM

## 2023-10-24 DIAGNOSIS — G4733 Obstructive sleep apnea (adult) (pediatric): Secondary | ICD-10-CM

## 2023-10-24 DIAGNOSIS — T63481A Toxic effect of venom of other arthropod, accidental (unintentional), initial encounter: Secondary | ICD-10-CM

## 2023-10-24 DIAGNOSIS — T485X5A Adverse effect of other anti-common-cold drugs, initial encounter: Secondary | ICD-10-CM

## 2023-10-24 DIAGNOSIS — Z888 Allergy status to other drugs, medicaments and biological substances status: Secondary | ICD-10-CM

## 2023-10-24 DIAGNOSIS — T63481D Toxic effect of venom of other arthropod, accidental (unintentional), subsequent encounter: Secondary | ICD-10-CM | POA: Diagnosis not present

## 2023-10-24 MED ORDER — EPINEPHRINE 0.3 MG/0.3ML IJ SOAJ: INTRAMUSCULAR | 3 refills | Status: AC

## 2023-10-24 NOTE — Progress Notes (Signed)
 [place] [facility] [place] [facility] [place] [facility] [place] [facility] [place]   Follow-up Note  Referring Provider: No ref. provider found Primary Provider: Patient, No Pcp Per Date of Office Visit: 10/24/2023  Subjective:   [name] (DOB: Aug 19, 1955) is a 68 y.o. male who [facility] in re-evaluation of the following:  HPI: Morad returns to this clinic in evaluation of nonallergic rhinitis, LPR, sleep apnea, hymenoptera venom hypersensitivity state.  I last saw him in this clinic 25 October 2022.  Overall he believes that he is doing quite well and he does not have any difficulty using his CPAP mask at nighttime.  He uses a combination of Flonase  or Afrin or over-the-counter nasal sprays and he is not very regimented about using each 1 of these on a set pattern.  But he is very happy with the response that he is receiving on his current use of these medications and it sounds as though he is minimizing the use of Afrin is much as possible.  His reflux is really doing very well while continuing on omeprazole and famotidine  and has had no problems with his throat while utilizing this treatment.  He has not been stung.  Allergies as of 10/24/2023   No Known Allergies      Medication List    amLODipine  10 MG tablet Commonly known as: NORVASC  Take 1 tablet (10 mg total) by mouth daily.   clopidogrel  75 MG tablet Commonly known as: PLAVIX  take ONE tablet by MOUTH daily   EPINEPHrine  0.3 mg/0.3 mL Soaj injection Commonly known as: EPI-PEN Use as directed for life-threatening allergic reaction.   famotidine  40 MG tablet Commonly known as: PEPCID  Take one tablet by mouth at bedtime.   finasteride  5 MG tablet Commonly known as: PROSCAR  Take 5 mg by mouth daily.   FLUoxetine  20 MG capsule Commonly known as: PROZAC  Take 1 capsule (20 mg total) by mouth daily.   fluticasone  50 MCG/ACT nasal spray Commonly known as: FLONASE  Place 2 sprays into  both nostrils daily.   nitroGLYCERIN  0.4 MG SL tablet Commonly known as: NITROSTAT  Place 1 tablet (0.4 mg total) under the tongue every 5 (five) minutes as needed for chest pain.   omeprazole 40 MG capsule Commonly known as: PRILOSEC Take 40 mg by mouth daily.   OVER THE COUNTER MEDICATION Place 1 spray into both nostrils as needed. XClear (xylitol) nasal spray   oxymetazoline 0.05 % nasal spray Commonly known as: AFRIN Place 1 spray into both nostrils as needed for congestion.   Repatha  SureClick 140 MG/ML Soaj Generic drug: Evolocumab  Inject 140 mg into the skin every 14 (fourteen) days.   rosuvastatin  5 MG tablet Commonly known as: CRESTOR  Take 1 tablet (5 mg total) by mouth daily.   valsartan  80 MG tablet Commonly known as: DIOVAN  Take 1 tablet (80 mg total) by mouth daily.    Past Medical History:  Diagnosis Date   Anxiety    Arthritis    mostly in my hands; sometimes other areas (10/20/2013)   Bipolar disorder (HCC)    BPH (benign prostatic hyperplasia)    CAD S/P percutaneous coronary angioplasty 3/'13, 7/'15; 3/'21   (Therapeutic P2Y12 on Plavix ); a) STEMI -m RCA DES PCI; 2015: Class III angina (abnl MYOVIEW  w/ Ant-AntLat ischemia) -> m-d LAD 95-99% (PCI: Biotronik AG 2 DES 2.5 x 13, 2.75 mm); b. 3/'21 Cath/Staged PCI: LM nl, mLAD 60% (DFR .96), D1 65%  (DFR .95), mCx 60% (DFR .  96); pRCA 60% - m-d 85&70% (CSI -> 3.5x38 Resolute DES p-m, 3.0x8 Resolute DES m-d connecting new & old  STENTS). EF 55-65%.   Depression    Dizziness of unknown cause January 2011   abn MRI, Neuro w/u Jan 2011   Dyslipidemia, goal LDL below 70    Essential hypertension    GERD (gastroesophageal reflux disease)    RA (rheumatoid arthritis) (HCC)    Rheumatic fever 1969   ST elevation myocardial infarction (STEMI) involving right coronary artery in recovery phase (HCC) 06/2011   100% occluded RCA; PCI - 3.0x24 Promus DES (3.5-3.6 mm) ; Mod w moderate LAD & Cx disease, EF 55-60% w/ basal  inferoseptal HK confirmed by echo    Past Surgical History:  Procedure Laterality Date   CORONARY ATHERECTOMY N/A 06/05/2019   Procedure: CORONARY ATHERECTOMY;  Surgeon: Anner Alm ORN, MD;  Location: [facility] INVASIVE CV LAB;  Service: Cardiovascular; orbital atherectomy-mid RCA   CORONARY PRESSURE/FFR STUDY N/A 06/02/2019   Procedure: INTRAVASCULAR PRESSURE WIRE/FFR STUDY;  Surgeon: Anner Alm ORN, MD;  Location: [facility] INVASIVE CV LAB;  Service: Cardiovascular;  LCA: mLAD 60% (DFR .96), D1 65%  (DFR .95), mCx 60% (DFR .96)   CORONARY STENT INTERVENTION N/A 06/05/2019   Procedure: CORONARY STENT INTERVENTION;  Surgeon: Anner Alm ORN, MD;  Location: [facility] INVASIVE CV LAB;  Service: Cardiovascular;; pRCA 60% - m-d 85&70% (CSI -> 3.5x38 Resolute DES p-m, 3.0x8 Resolute DES m-d connecting new & old  STENTS).  Underestimated eccentric lesion in the bend making it difficult to connect the new stent to the prior stent (was thought to be stent - but was calcification   LEFT HEART CATH AND CORONARY ANGIOGRAPHY N/A 06/02/2019   Procedure: LEFT HEART CATH AND CORONARY ANGIOGRAPHY;  Surgeon: Anner Alm ORN, MD;  Location: [facility] INVASIVE CV LAB;  Service: Cardiovascular;;; LM - nml; mLAD 60% (DFR .96), D1 65%  (DFR .95), mCx 60% (DFR .96); mRCA 60%, 85%&70% (staged CSI PCI)   LEFT HEART CATHETERIZATION WITH CORONARY ANGIOGRAM N/A 06/28/2011   Procedure: LEFT HEART CATHETERIZATION WITH CORONARY ANGIOGRAM;  Surgeon: Alm ORN Anner, MD;  Location: [facility] CATH LAB;  50% LAD after D1, D1 30%.  Small D2.  Large caliber LCx with (small OM1) major OM 2 and 3 followed by ABG circumflex with 40% focal lesion terminating with LPL 1.  100% mid RCA (DES PCI); beyond 100% RCA  - large PDA (double barrel) and small PAV-PL -EF 55 to 60% w/ midInf-LAf HK.   LEFT HEART CATHETERIZATION WITH CORONARY ANGIOGRAM N/A 10/20/2013   Procedure: LEFT HEART CATHETERIZATION WITH CORONARY ANGIOGRAM;  Surgeon: Alm ORN Anner, MD;  Location: [facility] CATH LAB;   Service: Cardiovascular;  Laterality: N/A;   NM MYOVIEW  LTD N/A 10/19/2013   INTERMEDIATE RISK, septal, anterior-anterolateral ischemia --> referred for cath the following day -- LAD LESION TREATED WITH DES PCI   NM MYOVIEW  LTD  05/23/2018   EF estimated 45 to 50%.  Hypertensive response to exercise.  Medium sized moderate severity defect in the basal inferior mid inferolateral wall (read as intermediate risk) -> the defect actually appears to improve on stress imaging suggesting hibernating myocardium.   PERCUTANEOUS CORONARY STENT INTERVENTION (PCI-S) N/A 06/2011; 09/2013   a) 3/'13: 100%  RCA ->PCI 3.20x2 (3.6) Promus DES stent; 7/'15: mLAD PCI Biotronik AG DES 2.5 x13 2.75 mm), FFR D1 50-60% = 0.86 (small branch had ~70%), mCx ~50-60% FFR=1.0.   PLANTAR FASCIA SURGERY Left 2004   & neuroma   SHOULDER  ARTHROSCOPY W/ ROTATOR CUFF REPAIR Left July 2011   TEMPORARY PACEMAKER N/A 06/05/2019   Procedure: TEMPORARY PACEMAKER;  Surgeon: Anner Alm ORN, MD;  Location: [facility] INVASIVE CV LAB;  Service: Cardiovascular;  Laterality: N/A;   TRANSTHORACIC ECHOCARDIOGRAM N/A 05/2016   EF ~55%. Normal LV Size & function. NO RWMA (inferior HK no longer present). Mlid LVH - Gr 1 DD.. Mild LA dilation.     Review of systems negative except as noted in HPI / PMHx or noted below:  Review of Systems  Constitutional: Negative.   HENT: Negative.    Eyes: Negative.   Respiratory: Negative.    Cardiovascular: Negative.   Gastrointestinal: Negative.   Genitourinary: Negative.   Musculoskeletal: Negative.   Skin: Negative.   Neurological: Negative.   Endo/Heme/Allergies: Negative.   Psychiatric/Behavioral: Negative.       Objective:   Vitals:   10/24/23 1033 10/24/23 1034  BP: (!) 172/100 (!) 142/88  Pulse:    Resp:    SpO2:     Height: 5' 9.5 (176.5 cm)  Weight: 195 lb (88.5 kg)   Physical Exam Constitutional:      Appearance: He is not diaphoretic.  HENT:     Head: Normocephalic.     Right  Ear: Tympanic membrane, ear canal and external ear normal.     Left Ear: Tympanic membrane, ear canal and external ear normal.     Nose: Nose normal. No mucosal edema or rhinorrhea.     Mouth/Throat:     Pharynx: Uvula midline. No oropharyngeal exudate.  Eyes:     Conjunctiva/sclera: Conjunctivae normal.  Neck:     Thyroid : No thyromegaly.     Trachea: Trachea normal. No tracheal tenderness or tracheal deviation.  Cardiovascular:     Rate and Rhythm: Normal rate and regular rhythm.     Heart sounds: Normal heart sounds, S1 normal and S2 normal. No murmur heard. Pulmonary:     Effort: No respiratory distress.     Breath sounds: Normal breath sounds. No stridor. No wheezing or rales.  Lymphadenopathy:     Head:     Right side of head: No tonsillar adenopathy.     Left side of head: No tonsillar adenopathy.     Cervical: No cervical adenopathy.  Skin:    Findings: No erythema or rash.     Nails: There is no clubbing.  Neurological:     Mental Status: He is alert.     Diagnostics: None  Assessment and Plan:   1. Nonallergic rhinitis   2. Rhinitis medicamentosa   3. LPRD (laryngopharyngeal reflux disease)   4. Hymenoptera reaction   5. Obstructive sleep apnea    1.  Allergen avoidance measures -hymenoptera venom  2.  Use the following in the morning:   A. Flonase  / Fluticasone  - 1 spray each nostril  3. Treat reflux/LPR with the following:   A.  Omeprazole 40 mg - 1 tablet in AM  B.  Famotidine  40 mg - 1 tablet in p.m.  4.  If needed:   A.  Nasal saline  B.  EpiPen   C.  Flonase  + Afrin - 1 spray each nostril before CPAP use  5.  Return to clinic 12 months or earlier if problem  6. Influenza = Tamiflu. Covid = Paxlovid  Ramsey is doing quite well on his current plan of intermittently using Flonase  and Afrin and over-the-counter nasal sprays and overall he is very happy with the response that he is receiving while utilizing  this plan and he is able to use his CPAP  mask without any problem.  And he is also very satisfied with the response he is receiving regarding his LPR therapy as noted above.  Assuming he does well with this plan I will see him back in this clinic in 12 months or earlier if there is a problem.  Camellia Denis, MD Allergy  / Immunology [facility]

## 2023-10-24 NOTE — Patient Instructions (Addendum)
  1.  Allergen avoidance measures -hymenoptera venom  2.  Use the following in the morning:   A. Flonase  / Fluticasone  - 1 spray each nostril  3. Treat reflux/LPR with the following:   A.  Omeprazole 40 mg - 1 tablet in AM  B.  Famotidine  40 mg - 1 tablet in p.m.  4.  If needed:   A.  Nasal saline  B.  EpiPen   C.  Flonase  + Afrin - 1 spray each nostril before CPAP use  5.  Return to clinic 12 months or earlier if problem  6. Influenza = Tamiflu. Covid = Paxlovid

## 2023-10-28 ENCOUNTER — Encounter: Payer: Self-pay | Admitting: Allergy and Immunology

## 2023-10-30 ENCOUNTER — Telehealth: Payer: Self-pay

## 2023-10-30 NOTE — Telephone Encounter (Signed)
   Pre-operative Risk Assessment    Patient Name: [name]  DOB: Aug 24, 1955 MRN: [id number]   Date of last office visit: 04/19/23 ALM CLAY, MD Date of next office visit: 12/16/23 ALM CLAY, MD  Request for Surgical Clearance    Procedure:  Dental Extraction - Amount of Teeth to be Pulled:  1 TOOTH  Date of Surgery:  Clearance TBD                                Surgeon:  NOT INDICATED Surgeon's Group or Practice Name:  [facility] Phone number:  [phone] Fax number:  [phone]   Type of Clearance Requested:   - Medical  - Pharmacy:  Hold Clopidogrel  (Plavix )     Type of Anesthesia:  Local    Additional requests/questions:    Signed, Lucie DELENA Ku   10/30/2023, 5:33 PM

## 2023-10-31 NOTE — Telephone Encounter (Signed)
    Primary Cardiologist: Alm Clay, MD  Chart reviewed as part of pre-operative protocol coverage. Simple dental extractions are considered low risk procedures per guidelines and generally do not require any specific cardiac clearance. It is also generally accepted that for simple extractions and dental cleanings, there is no need to interrupt blood thinner therapy.   SBE prophylaxis is not required for the patient.  I will route this recommendation to the requesting party via Epic fax function and remove from pre-op pool.  Please call with questions.  Josefa CHRISTELLA Beauvais, NP 10/31/2023, 9:04 AM

## 2023-12-06 ENCOUNTER — Other Ambulatory Visit: Payer: Self-pay | Admitting: Adult Health

## 2023-12-16 ENCOUNTER — Ambulatory Visit: Admitting: Cardiology

## 2023-12-26 ENCOUNTER — Other Ambulatory Visit: Payer: Self-pay | Admitting: Adult Health

## 2023-12-29 ENCOUNTER — Other Ambulatory Visit: Payer: Self-pay | Admitting: Cardiology

## 2023-12-29 ENCOUNTER — Other Ambulatory Visit: Payer: Self-pay | Admitting: Adult Health

## 2024-01-13 ENCOUNTER — Ambulatory Visit: Attending: Cardiology | Admitting: Cardiology

## 2024-01-13 ENCOUNTER — Encounter: Payer: Self-pay | Admitting: Cardiology

## 2024-01-13 VITALS — BP 138/86 | HR 66 | Resp 16 | Ht 69.0 in | Wt 191.4 lb

## 2024-01-13 DIAGNOSIS — I2510 Atherosclerotic heart disease of native coronary artery without angina pectoris: Secondary | ICD-10-CM

## 2024-01-13 DIAGNOSIS — R5383 Other fatigue: Secondary | ICD-10-CM | POA: Diagnosis present

## 2024-01-13 DIAGNOSIS — T466X5D Adverse effect of antihyperlipidemic and antiarteriosclerotic drugs, subsequent encounter: Secondary | ICD-10-CM

## 2024-01-13 DIAGNOSIS — I2089 Other forms of angina pectoris: Secondary | ICD-10-CM | POA: Insufficient documentation

## 2024-01-13 DIAGNOSIS — E785 Hyperlipidemia, unspecified: Secondary | ICD-10-CM | POA: Diagnosis present

## 2024-01-13 DIAGNOSIS — T466X5A Adverse effect of antihyperlipidemic and antiarteriosclerotic drugs, initial encounter: Secondary | ICD-10-CM | POA: Diagnosis present

## 2024-01-13 DIAGNOSIS — Z955 Presence of coronary angioplasty implant and graft: Secondary | ICD-10-CM | POA: Diagnosis present

## 2024-01-13 DIAGNOSIS — I25119 Atherosclerotic heart disease of native coronary artery with unspecified angina pectoris: Secondary | ICD-10-CM | POA: Diagnosis not present

## 2024-01-13 DIAGNOSIS — R0609 Other forms of dyspnea: Secondary | ICD-10-CM | POA: Diagnosis not present

## 2024-01-13 DIAGNOSIS — I2119 ST elevation (STEMI) myocardial infarction involving other coronary artery of inferior wall: Secondary | ICD-10-CM | POA: Insufficient documentation

## 2024-01-13 DIAGNOSIS — E860 Dehydration: Secondary | ICD-10-CM | POA: Diagnosis present

## 2024-01-13 DIAGNOSIS — G720 Drug-induced myopathy: Secondary | ICD-10-CM | POA: Diagnosis present

## 2024-01-13 DIAGNOSIS — I10 Essential (primary) hypertension: Secondary | ICD-10-CM | POA: Diagnosis not present

## 2024-01-13 NOTE — Progress Notes (Unsigned)
 Cardiology Office Note:  .   Date:  01/15/2024  ID:  [name], DOB 10/02/1955, MRN [id number] PCP: Brent Norleen PARAS., MD  [facility] Providers Cardiologist:  Alm Clay, MD     Chief Complaint  Patient presents with   Coronary artery disease involving native coronary artery of   Essential hypertension   Angina, class II - decreased exercise tolerance, dyspnea &    Follow-up    6 months    Patient Profile: .     [name] is a  68 y.o. male with a PMH CAD, HTN and HLD who presents here for 46-month follow-up to discuss exertional dyspnea and fatigue.   CAD History:  Inferior STEMI MI -> PCI to the RCA in 2013.   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 Also had D1 and circumflex disease evaluated with FFR --> nonsignificant. Medical therapy. Crescendo Angina March 2021: (Myoview  in February 2020 showed inferior ischemia -> Cath delayed due to COVID-19 restrictions) ->  (06/02/2019), CATH: moderate lesions in the LCA => all negative by DFR, extensive mid-distal RCA disease, calcified prior to previous stent-- (06/05/2019), STAGED CSI DES PCI 2 additional proximal overlapping stents that overlap the distal/original stent. ->  Reviewed below (01/2023) Myoview : No Ischemia, + Prior Infarct (medium size mild intensity fixed defect in the mid to basal inferior and apical lateral wall with abnormal wall motion consistent with infarction).  Excellent exercise tolerance-10.1 METS (8 minutes).  EF estimated at 38%.  INTERMEDIATE RISK due to reduced EF and infarct. Hypertension,  Hyperlipidemia     [name] was last seen on April 19, 2023  Subjective  Discussed the use of AI scribe software for clinical note transcription with the patient, who gave verbal consent to proceed.  History of Present Illness [name] is a 68 year old male with coronary artery disease who presents with  exertional dyspnea and fatigue.  He experiences significant fatigue and low energy levels, particularly exacerbated by exposure to sunlight. His symptoms worsen during hot weather, leading to increased sweating and dark urine. He attempts to stay hydrated but acknowledges that he may not be drinking enough fluids before engaging in activities.  He experiences shortness of breath during activities but denies any chest pain.  He denies any PND or orthopnea, but does note having occasional ankle swelling, which he attributes to prolonged standing or sitting.  He denies any rapid irregular heartbeats or palpitations.  No lightheadedness or dizziness or wooziness.  No syncope or near syncope.  No claudication symptoms.  No TIA or amaurosis fugax symptoms.  No melena, hematochezia, hematuria or epistaxis.  His past medical history includes coronary artery disease, for which he has a stent in the right coronary artery. He is currently on several medications, including Repatha , Norvasc  10 mg, rosuvastatin  5 mg, Plavix , and valsartan . He is not on a beta blocker due to previous fatigue and does not use nitroglycerin .  He has a history of borderline diabetes but has not checked his blood sugar levels recently.  He mentions that he has not been able to engage in activities like golf due to his low energy levels.  He does however acknowledge that he has become somewhat sedentary and no longer really exercising.     Objective   Current Meds Amlodipine  10 mg daily, valsartan  80 mg daily PRN NTG Plavix  75 mg daily Repatha  140 mg every 14  days; rosuvastatin  5 mg daily  None CV Medications: Sig   EPINEPHrine  0.3 mg/0.3 mL IJ SOAJ injection Use as directed for life-threatening allergic reaction.   famotidine  (PEPCID ) 40 MG tablet Take one tablet by mouth at bedtime.   finasteride  (PROSCAR ) 5 MG tablet Take 5 mg by mouth daily.   FLUoxetine  (PROZAC ) 20 MG capsule Take 1 capsule (20 mg total) by mouth daily.    fluticasone  (FLONASE ) 50 MCG/ACT nasal spray Place 2 sprays into both nostrils daily.   omeprazole (PRILOSEC) 40 MG capsule Take 40 mg by mouth daily.   oxymetazoline (AFRIN) 0.05 % nasal spray Place 1 spray into both nostrils as needed for congestion.    Studies Reviewed: .       Results  Lab Results  Component Value Date   CHOL 71 (L) 02/14/2023   HDL 34 (L) 02/14/2023   LDLCALC 9 02/14/2023   TRIG 171 (H) 02/14/2023   CHOLHDL 2.1 02/14/2023   Lab Results  Component Value Date   NA 140 02/14/2023   K 4.5 02/14/2023   CREATININE 0.96 02/14/2023   EGFR 87 02/14/2023   GLUCOSE 152 (H) 02/14/2023  Last A1c was from February 2023 it was 5.9.  DIAGNOSTIC Myoview  Stress Test (October 2024): Walked for 8 minutes (10.1 METS), achieved target metabolic equivalents, no chest pain.  Medium sized mild intensity mid to basal inferior inferolateral fixed defect consistent with infarction.  EF estimated 30 to 44%.-Read as intermediate risk due to reduced EF and prior infarction.  No evidence of ischemia.    Echocardiogram (06/01/2016): Normal LV function with EF 55 to 60% and no obvious RWMA.  Mild concentric LVH with GR 1 DD associated with Mild LA dilation. Cardiac Cath (06/02/2019): Severe RCA disease with tandem 80 and 70% lesions, heavily calcified as likely culprit (prior to previous stent); Moderate (DFR negative) prox D1, mid LAD & mid LCx lesions with otherwise widely patent distal LAD stent; Normal LVEF and EDP. 06/05/2019 STAGED ATHERECTOMY PCI RCA: Resolute Onyx 3.0 mm 8 mm overlapping most distal stent followed by 3.5 mm x 38 mm proximal covering up to the 60% lesion. -Difficult to overlap the distal stent because of unappreciated lesion in the crux of the RCA        Risk Assessment/Calculations:          Physical Exam:   VS:  BP 138/86   Pulse 66   Resp 16   Ht 5' 9 (1.753 m)   Wt 191 lb 6.4 oz (86.8 kg)   SpO2 97%   BMI 28.26 kg/m    Wt Readings from Last 3 Encounters:   01/13/24 191 lb 6.4 oz (86.8 kg)  10/24/23 195 lb (88.5 kg)  04/19/23 197 lb (89.4 kg)      GEN: Healthy-appearing.  Well nourished, well groomed, in no acute distress; mood is usually somewhat down but otherwise doing well NECK: No JVD; No carotid bruits CARDIAC: Normal S1, S2; RRR, no murmurs, rubs, gallops RESPIRATORY:  Clear to auscultation without rales, wheezing or rhonchi ; nonlabored, good air movement. ABDOMEN: Soft, non-tender, non-distended EXTREMITIES:  No edema; No deformity      ASSESSMENT AND PLAN: .    Problem List Items Addressed This Visit       Cardiology Problems   Coronary artery disease involving native coronary artery of native heart with angina pectoris - Primary (Chronic)   No new chest pain or significant symptom changes.  Treadmill Myoview  Stress Test last year was nonischemic and  he was able to reach 10 METS. He is not complaining of exertional dyspnea and fatigue which could be an anginal equivalent since he really did not have classic angina at the time of his MI. Symptoms likely related to dehydration and antihypertensive medication effects. Previous stress test normal.  - Order treadmill stress test to assess exercise tolerance and EKG changes.  - Advise to hydrate adequately before physical activities.  - Continue amlodipine  10 mg daily for BP and antianginal benefit along with Diovan  80 mg daily for afterload reduction.  - Continue current medications including Repatha  140 mg weekly along with daily low-dose 5 mg rosuvastatin    - Continue maintenance SAPT: Plavix  75 mg daily: Okay to hold 5 to 7 days preop for surgeries or procedures.      Relevant Orders   Cardiac Stress Test: Informed Consent Details: Physician/Practitioner Attestation; Transcribe to consent form and obtain patient signature   Cardiac Stress Test: Informed Consent Details: Physician/Practitioner Attestation; Transcribe to consent form and obtain patient signature    Essential hypertension (Chronic)   Blood pressure slightly elevated on initial check, but improved after being seated..  Current regimen includes Norvasc /amlodipine  10 mg daily and valsartan  80 mg daily.  Avoid diuretics due to dehydration risk, and beta-blocker due to fatigue - Monitor blood pressure during stress test. - Consider increasing valsartan  if blood pressure remains elevated.      History of ST elevation myocardial infarction (STEMI) of inferior wall (Chronic)   Initial MI back in April 2015 was RCA occlusion with RCA PCI. Infarct noted on Myoview  which also underestimated EF as his EF has been 55 to 60%.  He has chronic exertional dyspnea and fatigue, we have stopped beta-blocker and weaned off some of his other medications.  Last year he did relatively well on treadmill stress test and we will reassess.       Hyperlipidemia with target low density lipoprotein (LDL) cholesterol less than 55 mg/dL (Chronic)   Was only able to tolerate low-dose of rosuvastatin  5 mg daily which were continuing for the atrophic effects and inflammatory benefit. Otherwise lipids well-controlled with Repatha  140 mg every 2 weeks. -Most recent LDL (November 2024) was 9.  Due for labs to be checked again either this month or next month.      Stable angina (HCC) atypical symptoms manifested as exertional dyspnea, fatigue and sweating (Chronic)   Relevant Orders   Cardiac Stress Test: Informed Consent Details: Physician/Practitioner Attestation; Transcribe to consent form and obtain patient signature   Cardiac Stress Test: Informed Consent Details: Physician/Practitioner Attestation; Transcribe to consent form and obtain patient signature     Other   Dehydration after exertion   Dehydration indicated by dark urine and decreased urination, contributing to fatigue and dyspnea. - Advise to increase fluid intake, especially before physical activities. - Educate on signs of dehydration and importance  of pre-activity hydration.      Dyspnea on exertion   He still has exertional dyspnea.  Reassess with GXT; - Would next consider checking 2D echo.      Relevant Orders   Cardiac Stress Test: Informed Consent Details: Physician/Practitioner Attestation; Transcribe to consent form and obtain patient signature   EXERCISE TOLERANCE TEST (ETT)   Cardiac Stress Test: Informed Consent Details: Physician/Practitioner Attestation; Transcribe to consent form and obtain patient signature   Fatigue due to treatment (Chronic)   No longer on beta-blocker.  This improves some of his symptoms but he still has some exertional dyspnea and exercise intolerance.  Reassess exercise tolerance with GXT      Presence of DES in RCA - Promus DES 3.0 mm x 24 mm (post-dilated to 3.29mm) -> with 3 additional stents placed;  LAD coronary artery: Biotronik AG DES 2.5 mm x 13 mm (2.75 mm) (Chronic)   4-1/2 years out from complex extensive PCI to the RCA with existing stents in the distal LAD. Plan is lifelong Thienopyridine monotherapy with Plavix /clopidogrel  75 mg daily. Okay to hold Plavix  5 to 7 days preop for surgeries or procedures Okay to hold Plavix  3 to 5 days for bleeding/significant bruising, until bleeding stops      Statin myopathy (Chronic)   Intolerant of simvastatin and atorvastatin  with significant myalgias and arthralgias as well as fatigue.  He did not do well with higher dose of rosuvastatin  but has been able to tolerate 5 mg daily.  He was not at goal on this regimen so he was therefore started on Repatha  which has done amazingly well.           Informed Consent   Shared Decision Making/Informed Consent{ The risks [chest pain, shortness of breath, cardiac arrhythmias, dizziness, blood pressure fluctuations, myocardial infarction, stroke/transient ischemic attack, and life-threatening complications (estimated to be 1 in 10,000)], benefits (risk stratification, diagnosing coronary artery disease,  treatment guidance) and alternatives of an exercise tolerance test were discussed in detail with [name] and he agrees to proceed.      Follow-Up: No follow-ups on file.     Signed, Alm MICAEL Clay, MD, MS Alm Clay, M.D., M.S. Interventional Cardiologist  [facility] Pager # [phone]

## 2024-01-13 NOTE — Patient Instructions (Signed)
 Medication Instructions:  Your physician recommends that you continue on your current medications as directed. Please refer to the Current Medication list given to you today.  *If you need a refill on your cardiac medications before your next appointment, please call your pharmacy*  Lab Work: NONE If you have labs (blood work) drawn today and your tests are completely normal, you will receive your results only by: MyChart Message (if you have MyChart) OR A paper copy in the mail If you have any lab test that is abnormal or we need to change your treatment, we will call you to review the results.  Testing/Procedures: Exercise Tolerance Test **NO food or drink for 4 hours prior to test **NO caffeine, decaf or chocolate for 12 hours prior to test **Wear close toed shoes and comfortable clothing   Follow-Up: At [facility], you and your health needs are our priority.  As part of our continuing mission to provide you with exceptional heart care, our providers are all part of one team.  This team includes your primary Cardiologist (physician) and Advanced Practice Providers or APPs (Physician Assistants and Nurse Practitioners) who all work together to provide you with the care you need, when you need it.  Your next appointment:   6 month(s)  Provider:   Alm Clay, MD    We recommend signing up for the patient portal called MyChart.  Sign up information is provided on this After Visit Summary.  MyChart is used to connect with patients for Virtual Visits (Telemedicine).  Patients are able to view lab/test results, encounter notes, upcoming appointments, etc.  Non-urgent messages can be sent to your provider as well.   To learn more about what you can do with MyChart, go to [email or web address].

## 2024-01-15 ENCOUNTER — Encounter: Payer: Self-pay | Admitting: Cardiology

## 2024-01-15 DIAGNOSIS — E860 Dehydration: Secondary | ICD-10-CM | POA: Insufficient documentation

## 2024-01-15 NOTE — Assessment & Plan Note (Signed)
 4-1/2 years out from complex extensive PCI to the RCA with existing stents in the distal LAD. Plan is lifelong Thienopyridine monotherapy with Plavix /clopidogrel  75 mg daily. Okay to hold Plavix  5 to 7 days preop for surgeries or procedures Okay to hold Plavix  3 to 5 days for bleeding/significant bruising, until bleeding stops

## 2024-01-15 NOTE — Assessment & Plan Note (Deleted)
 4-1/2 years out from complex extensive PCI to the RCA with existing stents in the distal LAD. Plan is lifelong Thienopyridine monotherapy with Plavix /clopidogrel  75 mg daily. Okay to hold Plavix  5 to 7 days preop for surgeries or procedures Okay to hold Plavix  3 to 5 days for bleeding/significant bruising, until bleeding stops

## 2024-01-15 NOTE — Assessment & Plan Note (Signed)
 Was only able to tolerate low-dose of rosuvastatin  5 mg daily which were continuing for the atrophic effects and inflammatory benefit. Otherwise lipids well-controlled with Repatha  140 mg every 2 weeks. -Most recent LDL (November 2024) was 9.  Due for labs to be checked again either this month or next month.

## 2024-01-15 NOTE — Assessment & Plan Note (Signed)
 No longer on beta-blocker.  This improves some of his symptoms but he still has some exertional dyspnea and exercise intolerance.  Reassess exercise tolerance with GXT

## 2024-01-15 NOTE — Assessment & Plan Note (Signed)
 No new chest pain or significant symptom changes.  Treadmill Myoview  Stress Test last year was nonischemic and he was able to reach 10 METS. He is not complaining of exertional dyspnea and fatigue which could be an anginal equivalent since he really did not have classic angina at the time of his MI. Symptoms likely related to dehydration and antihypertensive medication effects. Previous stress test normal.  - Order treadmill stress test to assess exercise tolerance and EKG changes.  - Advise to hydrate adequately before physical activities.  - Continue amlodipine  10 mg daily for BP and antianginal benefit along with Diovan  80 mg daily for afterload reduction.  - Continue current medications including Repatha  140 mg weekly along with daily low-dose 5 mg rosuvastatin    - Continue maintenance SAPT: Plavix  75 mg daily: Okay to hold 5 to 7 days preop for surgeries or procedures.

## 2024-01-15 NOTE — Assessment & Plan Note (Signed)
 Dehydration indicated by dark urine and decreased urination, contributing to fatigue and dyspnea. - Advise to increase fluid intake, especially before physical activities. - Educate on signs of dehydration and importance of pre-activity hydration.

## 2024-01-15 NOTE — Assessment & Plan Note (Signed)
 Intolerant of simvastatin and atorvastatin  with significant myalgias and arthralgias as well as fatigue.  He did not do well with higher dose of rosuvastatin  but has been able to tolerate 5 mg daily.  He was not at goal on this regimen so he was therefore started on Repatha  which has done amazingly well.

## 2024-01-15 NOTE — Assessment & Plan Note (Signed)
 Blood pressure slightly elevated on initial check, but improved after being seated..  Current regimen includes Norvasc /amlodipine  10 mg daily and valsartan  80 mg daily.  Avoid diuretics due to dehydration risk, and beta-blocker due to fatigue - Monitor blood pressure during stress test. - Consider increasing valsartan  if blood pressure remains elevated.

## 2024-01-15 NOTE — Assessment & Plan Note (Addendum)
 He still has exertional dyspnea.  Reassess with GXT; - Would next consider checking 2D echo.

## 2024-01-15 NOTE — Assessment & Plan Note (Signed)
 Initial MI back in April 2015 was RCA occlusion with RCA PCI. Infarct noted on Myoview  which also underestimated EF as his EF has been 55 to 60%.  He has chronic exertional dyspnea and fatigue, we have stopped beta-blocker and weaned off some of his other medications.  Last year he did relatively well on treadmill stress test and we will reassess.

## 2024-01-17 ENCOUNTER — Encounter (HOSPITAL_COMMUNITY): Payer: Self-pay | Admitting: *Deleted

## 2024-01-31 ENCOUNTER — Ambulatory Visit (HOSPITAL_COMMUNITY)
Admission: RE | Admit: 2024-01-31 | Discharge: 2024-01-31 | Disposition: A | Discharge status: Home / Self Care | Source: Ambulatory Visit | Attending: Cardiology | Admitting: Cardiology

## 2024-01-31 DIAGNOSIS — R0609 Other forms of dyspnea: Secondary | ICD-10-CM | POA: Insufficient documentation

## 2024-01-31 LAB — EXERCISE TOLERANCE TEST
Angina Index: 0
Duke Treadmill Score: 8
Estimated workload: 10.1
Exercise duration (min): 8 min
Exercise duration (sec): 1 s
MPHR: 152 {beats}/min
Peak HR: 139 {beats}/min
Percent HR: 91 %
RPE: 18
Rest HR: 70 {beats}/min
ST Depression (mm): 0 mm

## 2024-02-01 ENCOUNTER — Ambulatory Visit: Payer: Self-pay | Admitting: Cardiology

## 2024-02-27 ENCOUNTER — Other Ambulatory Visit: Payer: Self-pay | Admitting: Allergy and Immunology

## 2024-03-20 ENCOUNTER — Other Ambulatory Visit: Payer: Self-pay | Admitting: Cardiology

## 2024-03-20 DIAGNOSIS — I25119 Atherosclerotic heart disease of native coronary artery with unspecified angina pectoris: Secondary | ICD-10-CM

## 2024-03-20 DIAGNOSIS — E785 Hyperlipidemia, unspecified: Secondary | ICD-10-CM

## 2024-04-06 ENCOUNTER — Other Ambulatory Visit (HOSPITAL_COMMUNITY): Payer: Self-pay

## 2024-04-06 ENCOUNTER — Telehealth: Payer: Self-pay | Admitting: Pharmacy Technician

## 2024-04-06 NOTE — Telephone Encounter (Signed)
 Pharmacy Patient Advocate Encounter   Received notification from CoverMyMeds that prior authorization for repatha  is required/requested.   Insurance verification completed.   The patient is insured through [employer].   Per test claim: PA required; PA submitted to above mentioned insurance via Latent Key/confirmation #/EOC [id number] Status is pending

## 2024-04-06 NOTE — Telephone Encounter (Signed)
 Pharmacy Patient Advocate Encounter  Received notification from [employer] that Prior Authorization for repatha  has been APPROVED from 04/06/24 to 04/06/25   PA #/Case ID/Reference #: [id number]

## 2024-04-07 DIAGNOSIS — E785 Hyperlipidemia, unspecified: Secondary | ICD-10-CM

## 2024-04-07 DIAGNOSIS — I25119 Atherosclerotic heart disease of native coronary artery with unspecified angina pectoris: Secondary | ICD-10-CM

## 2024-04-07 MED ORDER — CLOPIDOGREL BISULFATE 75 MG PO TABS: 75.0000 mg | ORAL_TABLET | Freq: Every day | ORAL | 3 refills | Status: AC

## 2024-04-07 MED ORDER — REPATHA SURECLICK 140 MG/ML ~~LOC~~ SOAJ: 140.0000 mg | SUBCUTANEOUS | 3 refills | Status: AC

## 2024-04-07 MED ORDER — AMLODIPINE BESYLATE 10 MG PO TABS: 10.0000 mg | ORAL_TABLET | Freq: Every day | ORAL | 3 refills | Status: AC

## 2024-09-30 ENCOUNTER — Ambulatory Visit: Admitting: Psychiatry

## 2024-10-21 ENCOUNTER — Ambulatory Visit: Admitting: Allergy and Immunology
